# Patient Record
Sex: Female | Born: 1945
Health system: Southern US, Community
[De-identification: ages and names within clinical notes are randomized; demographics above are authoritative.]

## PROBLEM LIST (undated history)

## (undated) DIAGNOSIS — T7840XA Allergy, unspecified, initial encounter: Secondary | ICD-10-CM

## (undated) DIAGNOSIS — H269 Unspecified cataract: Secondary | ICD-10-CM

## (undated) DIAGNOSIS — C679 Malignant neoplasm of bladder, unspecified: Secondary | ICD-10-CM

## (undated) DIAGNOSIS — G4730 Sleep apnea, unspecified: Secondary | ICD-10-CM

## (undated) DIAGNOSIS — E785 Hyperlipidemia, unspecified: Secondary | ICD-10-CM

## (undated) DIAGNOSIS — M1990 Unspecified osteoarthritis, unspecified site: Secondary | ICD-10-CM

## (undated) DIAGNOSIS — R519 Headache, unspecified: Secondary | ICD-10-CM

## (undated) DIAGNOSIS — Z87442 Personal history of urinary calculi: Secondary | ICD-10-CM

## (undated) DIAGNOSIS — R51 Headache: Secondary | ICD-10-CM

## (undated) DIAGNOSIS — K219 Gastro-esophageal reflux disease without esophagitis: Secondary | ICD-10-CM

## (undated) DIAGNOSIS — I10 Essential (primary) hypertension: Secondary | ICD-10-CM

## (undated) HISTORY — PX: PARTIAL HYSTERECTOMY: SHX80

## (undated) HISTORY — PX: EYE SURGERY: SHX253

## (undated) HISTORY — DX: Allergy, unspecified, initial encounter: T78.40XA

## (undated) HISTORY — PX: CORNEAL TRANSPLANT: SHX108

## (undated) HISTORY — PX: CHOLECYSTECTOMY: SHX55

## (undated) HISTORY — DX: Unspecified osteoarthritis, unspecified site: M19.90

## (undated) HISTORY — DX: Unspecified cataract: H26.9

## (undated) HISTORY — DX: Malignant neoplasm of bladder, unspecified: C67.9

## (undated) HISTORY — PX: APPENDECTOMY: SHX54

## (undated) HISTORY — DX: Gastro-esophageal reflux disease without esophagitis: K21.9

## (undated) HISTORY — DX: Essential (primary) hypertension: I10

## (undated) HISTORY — DX: Personal history of urinary calculi: Z87.442

## (undated) HISTORY — PX: FOOT SURGERY: SHX648

## (undated) HISTORY — DX: Sleep apnea, unspecified: G47.30

## (undated) HISTORY — DX: Hyperlipidemia, unspecified: E78.5

## (undated) HISTORY — PX: TONSILLECTOMY: SUR1361

---

## 1998-10-30 ENCOUNTER — Other Ambulatory Visit: Admission: RE | Admit: 1998-10-30 | Discharge: 1998-10-30 | Payer: Self-pay | Admitting: Obstetrics & Gynecology

## 1999-10-28 ENCOUNTER — Other Ambulatory Visit: Admission: RE | Admit: 1999-10-28 | Discharge: 1999-10-28 | Payer: Self-pay | Admitting: Obstetrics & Gynecology

## 2001-01-18 ENCOUNTER — Other Ambulatory Visit: Admission: RE | Admit: 2001-01-18 | Discharge: 2001-01-18 | Payer: Self-pay | Admitting: Obstetrics & Gynecology

## 2002-01-18 ENCOUNTER — Other Ambulatory Visit: Admission: RE | Admit: 2002-01-18 | Discharge: 2002-01-18 | Payer: Self-pay | Admitting: Obstetrics & Gynecology

## 2002-01-31 ENCOUNTER — Encounter: Admission: RE | Admit: 2002-01-31 | Discharge: 2002-01-31 | Payer: Self-pay | Admitting: Obstetrics & Gynecology

## 2002-01-31 ENCOUNTER — Encounter: Payer: Self-pay | Admitting: Obstetrics & Gynecology

## 2003-02-14 ENCOUNTER — Other Ambulatory Visit: Admission: RE | Admit: 2003-02-14 | Discharge: 2003-02-14 | Payer: Self-pay | Admitting: Obstetrics & Gynecology

## 2004-05-29 ENCOUNTER — Ambulatory Visit: Payer: Self-pay | Admitting: Gastroenterology

## 2004-06-09 ENCOUNTER — Ambulatory Visit (HOSPITAL_COMMUNITY): Admission: RE | Admit: 2004-06-09 | Discharge: 2004-06-09 | Payer: Self-pay | Admitting: Gastroenterology

## 2004-06-09 ENCOUNTER — Ambulatory Visit: Payer: Self-pay | Admitting: Gastroenterology

## 2004-07-13 ENCOUNTER — Other Ambulatory Visit: Admission: RE | Admit: 2004-07-13 | Discharge: 2004-07-13 | Payer: Self-pay | Admitting: Obstetrics & Gynecology

## 2009-06-06 ENCOUNTER — Encounter (INDEPENDENT_AMBULATORY_CARE_PROVIDER_SITE_OTHER): Payer: Self-pay | Admitting: *Deleted

## 2009-11-28 ENCOUNTER — Encounter (INDEPENDENT_AMBULATORY_CARE_PROVIDER_SITE_OTHER): Payer: Self-pay | Admitting: *Deleted

## 2009-12-12 ENCOUNTER — Encounter (INDEPENDENT_AMBULATORY_CARE_PROVIDER_SITE_OTHER): Payer: Self-pay | Admitting: *Deleted

## 2009-12-17 ENCOUNTER — Ambulatory Visit: Payer: Self-pay | Admitting: Gastroenterology

## 2010-01-05 ENCOUNTER — Ambulatory Visit: Payer: Self-pay | Admitting: Gastroenterology

## 2010-01-08 ENCOUNTER — Encounter: Payer: Self-pay | Admitting: Gastroenterology

## 2010-03-03 NOTE — Letter (Signed)
Summary: [facility] Instructions  [facility]  [address] [place], [place] [address]   Phone: [phone]  Fax: [phone]       Tara Ingram    11/26/1945    MRN: [id number]        Procedure Day /Date: Monday 01-05-10      Arrival Time: 1:30 p.m.     Procedure Time: 2:30 p.m.     Location of Procedure:                      [facility] (4th Floor)   PREPARATION FOR COLONOSCOPY WITH MOVIPREP   Starting 5 days prior to your procedure  12-31-09 do not eat nuts, seeds, popcorn, corn, beans, peas,  salads, or any raw vegetables.  Do not take any fiber supplements (e.g. Metamucil, Citrucel, and Benefiber).  THE DAY BEFORE YOUR PROCEDURE         DATE:  01-04-10   DAY:  Sunday  1.  Drink clear liquids the entire day-NO SOLID FOOD  2.  Do not drink anything colored red or purple.  Avoid juices with pulp.  No orange juice.  3.  Drink at least 64 oz. (8 glasses) of fluid/clear liquids during the day to prevent dehydration and help the prep work efficiently.  CLEAR LIQUIDS INCLUDE: Water Jello Ice Popsicles Tea (sugar ok, no milk/cream) Powdered fruit flavored drinks Coffee (sugar ok, no milk/cream) Gatorade Juice: apple, white grape, white cranberry  Lemonade Clear bullion, consomm, broth Carbonated beverages (any kind) Strained chicken noodle soup Hard Candy                             4.  In the morning, mix first dose of MoviPrep solution:    Empty 1 Pouch A and 1 Pouch B into the disposable container    Add lukewarm drinking water to the top line of the container. Mix to dissolve    Refrigerate (mixed solution should be used within 24 hrs)  5.  Begin drinking the prep at 5:00 p.m. The MoviPrep container is divided by 4 marks.   Every 15 minutes drink the solution down to the next mark (approximately 8 oz) until the full liter is complete.   6.  Follow completed prep with 16 oz of clear liquid of your choice (Nothing red or  purple).  Continue to drink clear liquids until bedtime.  7.  Before going to bed, mix second dose of MoviPrep solution:    Empty 1 Pouch A and 1 Pouch B into the disposable container    Add lukewarm drinking water to the top line of the container. Mix to dissolve    Refrigerate  THE DAY OF YOUR PROCEDURE      DATE:  01-05-10  DAY:   Monday  Beginning at  9:30 a.m. (5 hours before procedure):         1. Every 15 minutes, drink the solution down to the next mark (approx 8 oz) until the full liter is complete.  2. Follow completed prep with 16 oz. of clear liquid of your choice.    3. You may drink clear liquids until  12:30 p.m. (2 HOURS BEFORE PROCEDURE).   MEDICATION INSTRUCTIONS  Unless otherwise instructed, you should take regular prescription medications with a small sip of water   as early as possible the morning of your procedure.    Additional medication instructions:Do not take Diovan/HCTZ  day of procedure.         OTHER INSTRUCTIONS  You will need a responsible adult at least 67 years of age to accompany you and drive you home.   This person must remain in the waiting room during your procedure.  Wear loose fitting clothing that is easily removed.  Leave jewelry and other valuables at home.  However, you may wish to bring a book to read or  an iPod/MP3 player to listen to music as you wait for your procedure to start.  Remove all body piercing jewelry and leave at home.  Total time from sign-in until discharge is approximately 2-3 hours.  You should go home directly after your procedure and rest.  You can resume normal activities the  day after your procedure.  The day of your procedure you should not:   Drive   Make legal decisions   Operate machinery   Drink alcohol   Return to work  You will receive specific instructions about eating, activities and medications before you leave.    The above instructions have been reviewed and explained  to me by   Ezra Sites RN  December 17, 2009 2:10 PM    I fully understand and can verbalize these instructions _____________________________ Date _________

## 2010-03-03 NOTE — Miscellaneous (Signed)
Summary: [facility]  Clinical Lists Changes  Medications: Added new medication of MOVIPREP 100 GM  SOLR (PEG-KCL-NACL-NASULF-NA ASC-C) As per prep instructions. - Signed Rx of MOVIPREP 100 GM  SOLR (PEG-KCL-NACL-NASULF-NA ASC-C) As per prep instructions.;  #1 x 0;  Signed;  Entered by: Ezra Sites RN;  Authorized by: Rachael Fee MD;  Method used: Electronically to [facility]*, [address] [place], [place]  [id number], Ph: [phone], Fax: [phone] Observations: Added new observation of NKA: T (12/17/2009 13:45)    Prescriptions: MOVIPREP 100 GM  SOLR (PEG-KCL-NACL-NASULF-NA ASC-C) As per prep instructions.  #1 x 0   Entered by:   Ezra Sites RN   Authorized by:   Rachael Fee MD   Signed by:   Ezra Sites RN on 12/17/2009   Method used:   Electronically to        [facility]* (retail)       [address]       [place], [place]  [id number]       Ph: [phone]       Fax: [phone]   RxID:   [id number]

## 2010-03-03 NOTE — Letter (Signed)
Summary: Pre Visit Letter Revised  [facility]  [address] [place], [place] [address]   Phone: [phone]  Fax: [phone]        11/28/2009 MRN: [id number] [facility] [address] [place], [place]  [address]             Procedure Date:  01/23/2010   Welcome to the Gastroenterology Division at [facility].    You are scheduled to see a nurse for your pre-procedure visit on 01/19/2010 at 10:30AM on the 3rd floor at [facility], [address]  We ask that you try to arrive at our office 15 minutes prior to your appointment time to allow for check-in.  Please take a minute to review the attached form.  If you answer "Yes" to one or more of the questions on the first page, we ask that you call the person listed at your earliest opportunity.  If you answer "No" to all of the questions, please complete the rest of the form and bring it to your appointment.    Your nurse visit will consist of discussing your medical and surgical history, your immediate family medical history, and your medications.   If you are unable to list all of your medications on the form, please bring the medication bottles to your appointment and we will list them.  We will need to be aware of both prescribed and over the counter drugs.  We will need to know exact dosage information as well.    Please be prepared to read and sign documents such as consent forms, a financial agreement, and acknowledgement forms.  If necessary, and with your consent, a friend or relative is welcome to sit-in on the nurse visit with you.  Please bring your insurance card so that we may make a copy of it.  If your insurance requires a referral to see a specialist, please bring your referral form from your primary care physician.  No co-pay is required for this nurse visit.     If you cannot keep your appointment, please call [phone] to cancel or reschedule prior to your appointment date.   This allows [place] the opportunity to schedule an appointment for another patient in need of care.    Thank you for choosing [facility] for your medical needs.  We appreciate the opportunity to care for you.  Please visit [place] at our website  to learn more about our practice.  Sincerely, The Gastroenterology Division

## 2010-03-03 NOTE — Letter (Signed)
Summary: Results Letter  [facility]  [address] [place], [place] [address]   Phone: [phone]  Fax: [phone]        January 08, 2010 MRN: [id number]    [facility] [address] [place], [place]  [address]    Dear Ms. Pizzo,   The polyps removed during your recent procedure were proven to be adenomatous.  These are pre-cancerous polyps that may have grown into cancers if they had not been removed.  Based on current nationally recognized surveillance guidelines, I recommend that you have a repeat colonoscopy in 1 year.   We will therefore put your information in our reminder system and will contact you in 1 year to schedule a repeat procedure.  Please call if you have any questions or concerns.       Sincerely,  Rachael Fee MD  This letter has been electronically signed by your physician.  Appended Document: Results Letter Letter mailed

## 2010-03-03 NOTE — Letter (Signed)
Summary: Colonoscopy Letter  [facility]  [address] [place], [place] [address]   Phone: [phone]  Fax: [phone]      Jun 06, 2009 MRN: [id number]   [facility] [address] [place], [place]  [address]   Dear Ms. Verley,   According to your medical record, it is time for you to schedule a Colonoscopy. The American Cancer Society recommends this procedure as a method to detect early colon cancer. Patients with a family history of colon cancer, or a personal history of colon polyps or inflammatory bowel disease are at increased risk.  This letter has been generated based on the recommendations made at the time of your procedure. If you feel that in your particular situation this may no longer apply, please contact our office.  Please call our office at [phone] to schedule this appointment or to update your records at your earliest convenience.  Thank you for cooperating with [place] to provide you with the very best care possible.   Sincerely,  Rachael Fee, M.D.  [facility] Gastroenterology Division [phone]

## 2010-03-03 NOTE — Procedures (Signed)
Summary: Colonoscopy  Patient: [name] Note: All result statuses are Final unless otherwise noted.  Tests: (1) Colonoscopy (COL)   COL Colonoscopy           DONE     [facility]     [address]     [place], [place]  [address]           COLONOSCOPY PROCEDURE REPORT           PATIENT:  Tara Ingram, Tara Ingram  MR#:  [id number]     BIRTHDATE:  September 23, 1945, 64 yrs. old  GENDER:  female     ENDOSCOPIST:  Rachael Fee, MD     PROCEDURE DATE:  01/05/2010     PROCEDURE:  Colonoscopy with snare polypectomy     ASA CLASS:  Class II     INDICATIONS:  history of pre-cancerous (adenomatous) colon polyps,     and FH of colon cancer (sister)     MEDICATIONS:   Fentanyl 75 mcg IV, Versed 7 mg IV           DESCRIPTION OF PROCEDURE:   After the risks benefits and     alternatives of the procedure were thoroughly explained, informed     consent was obtained.  Digital rectal exam was performed and     revealed no rectal masses.   The LB PCF-H180AL [id number] endoscope     was introduced through the anus and advanced to the cecum, which     was identified by both the appendix and ileocecal valve, without     limitations.  The quality of the prep was good, using MoviPrep.     The instrument was then slowly withdrawn as the colon was fully     examined.     <<PROCEDUREIMAGES>>     FINDINGS:  Four sessile polyps were found, all were removed. One     was 1cm long, located in cecum, removed piecemeal with cold snare     (path 1). One was 4mm across, located in ascending colon, removed     with cold snare, not retrieved. One was 4mm across, located in     transverse colon, removed with cold snare (path 1). The last was     12mm across, located in rectum, removed in piecemeal fashion with     snare/cautery (path 2) (see image1, image3, image4, image9, and     image11).  This was otherwise a normal examination of the colon     (see image8, image5, and image2).   Retroflexed views in the  rectum revealed no abnormalities.    The scope was then withdrawn     from the patient and the procedure completed.           COMPLICATIONS:  None     ENDOSCOPIC IMPRESSION:     1) Four polyps, all removed. Three were retrieved and sent to     pathology     2) Otherwise normal examination           RECOMMENDATIONS:           1) You will receive a letter within 1-2 weeks with the results     of your biopsy as well as final recommendations. Please call my     office if you have not received a letter after 3 weeks.  Likely     you will need a repeat colonoscopy in 1 year given piecemeal     resection  ______________________________     Rachael Fee, MD           n.     eSIGNED:   Rachael Fee at 01/05/2010 02:00 PM           [name], [id number]  Note: An exclamation mark (!) indicates a result that was not dispersed into the flowsheet. Document Creation Date: 01/05/2010 2:01 PM _______________________________________________________________________  (1) Order result status: Final Collection or observation date-time: 01/05/2010 13:54 Requested date-time:  Receipt date-time:  Reported date-time:  Referring Physician:   Ordering Physician: Rob Bunting [phone]) Specimen Source:  Source: [name] Order Number: [id number] Lab site:   Appended Document: Colonoscopy     Procedures Next Due Date:    Colonoscopy: 01/2011

## 2010-04-17 ENCOUNTER — Other Ambulatory Visit: Payer: Medicare Other

## 2010-04-17 ENCOUNTER — Ambulatory Visit (INDEPENDENT_AMBULATORY_CARE_PROVIDER_SITE_OTHER): Payer: Medicare Other | Admitting: Gastroenterology

## 2010-04-17 ENCOUNTER — Other Ambulatory Visit: Payer: Self-pay | Admitting: Gastroenterology

## 2010-04-17 ENCOUNTER — Encounter: Payer: Self-pay | Admitting: Gastroenterology

## 2010-04-17 DIAGNOSIS — R197 Diarrhea, unspecified: Secondary | ICD-10-CM

## 2010-04-17 LAB — IGA: IgA: 307 mg/dL (ref 68–378)

## 2010-04-17 LAB — SEDIMENTATION RATE: Sed Rate: 16 mm/hr (ref 0–22)

## 2010-04-21 LAB — CONVERTED CEMR LAB: Tissue Transglutaminase Ab, IgA: 5.2 units (ref <20)

## 2010-04-21 NOTE — Assessment & Plan Note (Signed)
History of Present Illness Visit Type: new patient  Primary GI MD: Rob Bunting MD Primary Provider: Claudie Fisherman, MD  Requesting Provider: na Chief Complaint: diarrhea  History of Present Illness:        very pleasant 65 year old woman who is here with her husband today. I performed colonoscopy december 2011 (4 colon polyps, adenomatous, one piecemeal resected, recall at one year interval).  she has chronic, years of issues with her bowels.  Since her colonoscopy, she has had loose stools following eating.  This is improving, but not yet normal.  Eating causes urgency, gas, pains in her mid abdomen. Maybe bread products are worse. She was having same problem for "year," but thinks its worse since the colonoscopy.  had cholecystecomy 15 years ago.  She always has loose stools.  Overall weight is up a bit.  No overt gi bleeding.           Current Medications (verified): 1)  None  Allergies (verified): No Known Drug Allergies  Past History:  Past Medical History:  adenomatous colon polyps  Past Surgical History:  none  Family History: sister with colon cancer  Social History:  she is married, she does not drink much alcohol or smoke cigarettes  Review of Systems       Pertinent positive and negative review of systems were noted in the above HPI and GI specific review of systems.  All other review of systems was otherwise negative.   Vital Signs:  Patient profile:   65 year old female Height:      64 inches Weight:      182 pounds BMI:     31.35 BSA:     1.88 Pulse rate:   88 / minute Pulse rhythm:   regular BP sitting:   128 / 76  (left arm) Cuff size:   regular  Vitals Entered By: Ok Anis CMA (April 17, 2010 10:33 AM)  Physical Exam  Additional Exam:  Constitutional: generally well appearing Psychiatric: alert and oriented times 3 Eyes: extraocular movements intact Mouth: oropharynx moist, no lesions Neck: supple, no  lymphadenopathy Cardiovascular: heart regular rate and rythm Lungs: CTA bilaterally Abdomen: soft, non-tender, non-distended, no obvious ascites, no peritoneal signs, normal bowel sounds Extremities: no lower extremity edema bilaterally Skin: no lesions on visible extremities    Impression & Recommendations:  Problem # 1:   Chronic loose stools, postprandial urgency  this may be related to this regulated by O. from her cholecystectomy. we will begin a trial of cholestyramine powder and she will call my office in 4 weeks to report on her symptoms. she had a colonoscopy 3-4 months ago and that does not need to be repeated. she is set for recall colonoscopy in about one year. she does state when she avoids breads , bread products that her diarrhea is less and and so we will check her for celiac sprue serologically.  Other Orders: TLB-IgA (Immunoglobulin A) (82784-IGA) TLB-Sedimentation Rate (ESR) (85652-ESR) T-Sprue Panel (Celiac Disease Aby Eval) (83516x3/[address])  Patient Instructions: 1)  You will get lab test(s) done today (celiac sprue panel, total IgA, ESR). 2)  Trial of cholestyramine powder, called into your pharmacy already. 3)  Call to report on your symptoms in 1 month. 4)  A copy of this information will be sent to Dr. Juleen China. 5)  The medication list was reviewed and reconciled.  All changed / newly prescribed medications were explained.  A complete medication list was provided to the patient /  caregiver. Prescriptions: CHOLESTYRAMINE   POWD (CHOLESTYRAMINE) take 4grams powder, once daily  #1 month x 3   Entered and Authorized by:   Rachael Fee MD   Signed by:   Rachael Fee MD on 04/17/2010   Method used:   Electronically to        [facility]* (retail)       [address]       [place], [place]  [id number]       Ph: [phone]       Fax: [phone]   RxID:   [id number]

## 2010-04-23 ENCOUNTER — Telehealth: Payer: Self-pay

## 2010-04-23 NOTE — Telephone Encounter (Signed)
Called pt and informed her of the lab results and the recommendation to have an EGD.  Pt states she is not ready to schedule at this time she will call when she is ready.

## 2010-04-23 NOTE — Telephone Encounter (Signed)
Message copied by Chales Abrahams on Thu Apr 23, 2010 10:52 AM ------      Message from: Chales Abrahams      Created: Tue Apr 21, 2010  1:50 PM       please call her.  celiac panel slightly elevated, she needs EGD at [facility] with biopsies.  She should not avoid gluten in the meantime.

## 2010-04-23 NOTE — Telephone Encounter (Signed)
ok 

## 2010-09-01 ENCOUNTER — Other Ambulatory Visit: Payer: Self-pay

## 2010-09-01 MED ORDER — CHOLESTYRAMINE 4 GM/DOSE PO POWD: 4.0000 g | Freq: Every day | ORAL | Status: DC

## 2010-11-18 DIAGNOSIS — T8529XA Other mechanical complication of intraocular lens, initial encounter: Secondary | ICD-10-CM | POA: Insufficient documentation

## 2010-11-18 DIAGNOSIS — IMO0002 Reserved for concepts with insufficient information to code with codable children: Secondary | ICD-10-CM | POA: Insufficient documentation

## 2010-12-31 ENCOUNTER — Encounter: Payer: Self-pay | Admitting: Gastroenterology

## 2011-01-06 ENCOUNTER — Telehealth: Payer: Self-pay | Admitting: Gastroenterology

## 2011-01-06 NOTE — Telephone Encounter (Signed)
Pt wants to make sure that her insurance will cover her colon that Dr Christella Hartigan recommends to be done at the end of the year.  I advised her that after the procedure is scheduled Morrie Sheldon will contact the insurance company and notify her of any problems.  Pt thanked me for calling

## 2011-02-25 ENCOUNTER — Other Ambulatory Visit: Payer: Self-pay

## 2011-02-25 MED ORDER — CHOLESTYRAMINE 4 GM/DOSE PO POWD: 4.0000 g | Freq: Every day | ORAL | Status: DC

## 2011-03-10 DIAGNOSIS — M1710 Unilateral primary osteoarthritis, unspecified knee: Secondary | ICD-10-CM | POA: Diagnosis not present

## 2011-03-10 DIAGNOSIS — Z79899 Other long term (current) drug therapy: Secondary | ICD-10-CM | POA: Diagnosis not present

## 2011-04-01 DIAGNOSIS — E789 Disorder of lipoprotein metabolism, unspecified: Secondary | ICD-10-CM | POA: Diagnosis not present

## 2011-04-08 DIAGNOSIS — I10 Essential (primary) hypertension: Secondary | ICD-10-CM | POA: Diagnosis not present

## 2011-04-08 DIAGNOSIS — M79609 Pain in unspecified limb: Secondary | ICD-10-CM | POA: Diagnosis not present

## 2011-04-08 DIAGNOSIS — E789 Disorder of lipoprotein metabolism, unspecified: Secondary | ICD-10-CM | POA: Diagnosis not present

## 2011-09-07 DIAGNOSIS — M2550 Pain in unspecified joint: Secondary | ICD-10-CM | POA: Diagnosis not present

## 2011-10-25 ENCOUNTER — Encounter: Payer: Self-pay | Admitting: Gastroenterology

## 2011-10-27 DIAGNOSIS — I10 Essential (primary) hypertension: Secondary | ICD-10-CM | POA: Diagnosis not present

## 2011-10-27 DIAGNOSIS — E789 Disorder of lipoprotein metabolism, unspecified: Secondary | ICD-10-CM | POA: Diagnosis not present

## 2011-10-28 ENCOUNTER — Encounter: Payer: Self-pay | Admitting: Gastroenterology

## 2011-11-03 DIAGNOSIS — M79609 Pain in unspecified limb: Secondary | ICD-10-CM | POA: Diagnosis not present

## 2011-11-03 DIAGNOSIS — Z23 Encounter for immunization: Secondary | ICD-10-CM | POA: Diagnosis not present

## 2011-11-03 DIAGNOSIS — I10 Essential (primary) hypertension: Secondary | ICD-10-CM | POA: Diagnosis not present

## 2011-11-03 DIAGNOSIS — H6120 Impacted cerumen, unspecified ear: Secondary | ICD-10-CM | POA: Diagnosis not present

## 2011-11-10 DIAGNOSIS — H6120 Impacted cerumen, unspecified ear: Secondary | ICD-10-CM | POA: Diagnosis not present

## 2011-11-24 ENCOUNTER — Ambulatory Visit (AMBULATORY_SURGERY_CENTER): Payer: Medicare Other | Admitting: *Deleted

## 2011-11-24 VITALS — Ht 64.0 in | Wt 189.0 lb

## 2011-11-24 DIAGNOSIS — Z1211 Encounter for screening for malignant neoplasm of colon: Secondary | ICD-10-CM

## 2011-11-24 DIAGNOSIS — Z800 Family history of malignant neoplasm of digestive organs: Secondary | ICD-10-CM

## 2011-11-24 DIAGNOSIS — Z86010 Personal history of colonic polyps: Secondary | ICD-10-CM

## 2011-11-24 MED ORDER — MOVIPREP 100 G PO SOLR: ORAL | Status: DC

## 2011-11-24 NOTE — Progress Notes (Signed)
Explained to patient to hold Cholestyramine packet 2 days prior to starting the moviprep, per Dr.Gessners' suggestion.  She understands.

## 2011-12-03 HISTORY — PX: COLONOSCOPY: SHX174

## 2011-12-08 ENCOUNTER — Encounter: Payer: Self-pay | Admitting: Gastroenterology

## 2011-12-08 ENCOUNTER — Ambulatory Visit (AMBULATORY_SURGERY_CENTER): Payer: Medicare Other | Admitting: Gastroenterology

## 2011-12-08 VITALS — BP 123/54 | HR 76 | Temp 98.3°F | Resp 18 | Ht 64.0 in | Wt 189.0 lb

## 2011-12-08 DIAGNOSIS — Z860100 Personal history of colon polyps, unspecified: Secondary | ICD-10-CM

## 2011-12-08 DIAGNOSIS — K5730 Diverticulosis of large intestine without perforation or abscess without bleeding: Secondary | ICD-10-CM

## 2011-12-08 DIAGNOSIS — D126 Benign neoplasm of colon, unspecified: Secondary | ICD-10-CM

## 2011-12-08 DIAGNOSIS — D129 Benign neoplasm of anus and anal canal: Secondary | ICD-10-CM

## 2011-12-08 DIAGNOSIS — Z800 Family history of malignant neoplasm of digestive organs: Secondary | ICD-10-CM

## 2011-12-08 DIAGNOSIS — D128 Benign neoplasm of rectum: Secondary | ICD-10-CM | POA: Diagnosis not present

## 2011-12-08 DIAGNOSIS — Z1211 Encounter for screening for malignant neoplasm of colon: Secondary | ICD-10-CM

## 2011-12-08 MED ORDER — SODIUM CHLORIDE 0.9 % IV SOLN: 500.0000 mL | INTRAVENOUS | Status: DC

## 2011-12-08 NOTE — Progress Notes (Signed)
The pt tolerated the colonoscopy very well. Maw   

## 2011-12-08 NOTE — Progress Notes (Signed)
Patient did not experience any of the following events: a burn prior to discharge; a fall within the facility; wrong site/side/patient/procedure/implant event; or a hospital transfer or hospital admission upon discharge from the facility. (G8907) Patient did not have preoperative order for IV antibiotic SSI prophylaxis. (G8918)  

## 2011-12-08 NOTE — Patient Instructions (Addendum)

## 2011-12-08 NOTE — Op Note (Signed)
[facility] [address] [place], [address]   COLONOSCOPY PROCEDURE REPORT  PATIENT: Tara Ingram, An  MR#: [id number] BIRTHDATE: 02/10/1945 , 66  yrs. old GENDER: Female ENDOSCOPIST: Rachael Fee, MD PROCEDURE DATE:  12/08/2011 PROCEDURE:   Colonoscopy with snare polypectomy ASA CLASS:   Class III INDICATIONS:adenomatous polyps 2011, one piecemeal resected. MEDICATIONS: Fentanyl 75 mcg IV, Versed 6 mg IV, and These medications were titrated to patient response per physician's verbal order  DESCRIPTION OF PROCEDURE:   After the risks benefits and alternatives of the procedure were thoroughly explained, informed consent was obtained.  A digital rectal exam revealed no abnormalities of the rectum.   The LB PCF-Q180AL [id number]  endoscope was introduced through the anus and advanced to the cecum, which was identified by both the appendix and ileocecal valve. No adverse events experienced.   The quality of the prep was good, using MoviPrep  The instrument was then slowly withdrawn as the colon was fully examined.  COLON FINDINGS: Three polyps were found, removed and all were sent to pathology.  These were located in transverse, descending and rectal segments; 3-6mm across, sessile, all removed with cold snare and all sent to pathology.  There was mild, left sided diverticulosis.  The examination was otherwise normal.  Retroflexed views revealed no abnormalities. The time to cecum=2 minutes 37 seconds.  Withdrawal time=8 minutes 19 seconds.  The scope was withdrawn and the procedure completed. COMPLICATIONS: There were no complications.  ENDOSCOPIC IMPRESSION: Three polyps were found, removed and all were sent to pathology. There was mild, left sided diverticulosis. The examination was otherwise normal.  RECOMMENDATIONS: If the polyp(s) removed today are proven to be adenomatous (pre-cancerous) polyps, you will need a colonoscopy in 3 years. You will  receive a letter within 1-2 weeks with the results of your biopsy as well as final recommendations.  Please call my office if you have not received a letter after 3 weeks.   eSigned:  Rachael Fee, MD 12/08/2011 10:27 AM   cc: Adela Lank, MD

## 2011-12-09 ENCOUNTER — Other Ambulatory Visit: Payer: Self-pay | Admitting: Gastroenterology

## 2011-12-09 ENCOUNTER — Telehealth: Payer: Self-pay

## 2011-12-09 NOTE — Telephone Encounter (Signed)
  Follow up Call-  Call back number 12/08/2011  Post procedure Call Back phone  # [phone]  Permission to leave phone message Yes     Patient questions:  Do you have a fever, pain , or abdominal swelling? no Pain Score  0 *  Have you tolerated food without any problems? yes  Have you been able to return to your normal activities? yes  Do you have any questions about your discharge instructions: Diet   no Medications  no Follow up visit  no  Do you have questions or concerns about your Care? no  Actions: * If pain score is 4 or above: No action needed, pain <4.  Per the pt, "no problems at all". Maw

## 2011-12-19 ENCOUNTER — Encounter: Payer: Self-pay | Admitting: Gastroenterology

## 2012-02-16 DIAGNOSIS — T8529XA Other mechanical complication of intraocular lens, initial encounter: Secondary | ICD-10-CM | POA: Diagnosis not present

## 2012-02-16 DIAGNOSIS — H2510 Age-related nuclear cataract, unspecified eye: Secondary | ICD-10-CM | POA: Diagnosis not present

## 2012-03-22 DIAGNOSIS — M159 Polyosteoarthritis, unspecified: Secondary | ICD-10-CM | POA: Diagnosis not present

## 2012-07-20 DIAGNOSIS — E789 Disorder of lipoprotein metabolism, unspecified: Secondary | ICD-10-CM | POA: Diagnosis not present

## 2012-07-20 DIAGNOSIS — M79609 Pain in unspecified limb: Secondary | ICD-10-CM | POA: Diagnosis not present

## 2012-08-09 DIAGNOSIS — H2510 Age-related nuclear cataract, unspecified eye: Secondary | ICD-10-CM | POA: Diagnosis not present

## 2012-08-09 DIAGNOSIS — Z5189 Encounter for other specified aftercare: Secondary | ICD-10-CM | POA: Diagnosis not present

## 2012-08-10 DIAGNOSIS — M47817 Spondylosis without myelopathy or radiculopathy, lumbosacral region: Secondary | ICD-10-CM | POA: Diagnosis not present

## 2012-08-10 DIAGNOSIS — M549 Dorsalgia, unspecified: Secondary | ICD-10-CM | POA: Diagnosis not present

## 2012-08-10 DIAGNOSIS — E789 Disorder of lipoprotein metabolism, unspecified: Secondary | ICD-10-CM | POA: Diagnosis not present

## 2012-08-10 DIAGNOSIS — M159 Polyosteoarthritis, unspecified: Secondary | ICD-10-CM | POA: Diagnosis not present

## 2012-08-14 DIAGNOSIS — M545 Low back pain: Secondary | ICD-10-CM | POA: Diagnosis not present

## 2012-08-16 DIAGNOSIS — L820 Inflamed seborrheic keratosis: Secondary | ICD-10-CM | POA: Diagnosis not present

## 2012-08-16 DIAGNOSIS — L819 Disorder of pigmentation, unspecified: Secondary | ICD-10-CM | POA: Diagnosis not present

## 2012-08-16 DIAGNOSIS — M545 Low back pain: Secondary | ICD-10-CM | POA: Diagnosis not present

## 2012-08-30 ENCOUNTER — Other Ambulatory Visit: Payer: Self-pay | Admitting: Gastroenterology

## 2012-08-30 DIAGNOSIS — M545 Low back pain: Secondary | ICD-10-CM | POA: Diagnosis not present

## 2012-08-31 DIAGNOSIS — M545 Low back pain: Secondary | ICD-10-CM | POA: Diagnosis not present

## 2012-09-01 DIAGNOSIS — M545 Low back pain: Secondary | ICD-10-CM | POA: Diagnosis not present

## 2012-09-05 DIAGNOSIS — M545 Low back pain: Secondary | ICD-10-CM | POA: Diagnosis not present

## 2012-09-06 DIAGNOSIS — M545 Low back pain: Secondary | ICD-10-CM | POA: Diagnosis not present

## 2012-09-08 DIAGNOSIS — M545 Low back pain: Secondary | ICD-10-CM | POA: Diagnosis not present

## 2012-09-11 DIAGNOSIS — M545 Low back pain: Secondary | ICD-10-CM | POA: Diagnosis not present

## 2012-09-13 DIAGNOSIS — M545 Low back pain: Secondary | ICD-10-CM | POA: Diagnosis not present

## 2012-09-14 DIAGNOSIS — M545 Low back pain: Secondary | ICD-10-CM | POA: Diagnosis not present

## 2012-09-27 DIAGNOSIS — M545 Low back pain: Secondary | ICD-10-CM | POA: Diagnosis not present

## 2013-01-02 DIAGNOSIS — E789 Disorder of lipoprotein metabolism, unspecified: Secondary | ICD-10-CM | POA: Diagnosis not present

## 2013-01-18 DIAGNOSIS — I10 Essential (primary) hypertension: Secondary | ICD-10-CM | POA: Diagnosis not present

## 2013-01-18 DIAGNOSIS — E789 Disorder of lipoprotein metabolism, unspecified: Secondary | ICD-10-CM | POA: Diagnosis not present

## 2013-01-18 DIAGNOSIS — M79609 Pain in unspecified limb: Secondary | ICD-10-CM | POA: Diagnosis not present

## 2013-03-05 DIAGNOSIS — I10 Essential (primary) hypertension: Secondary | ICD-10-CM | POA: Diagnosis not present

## 2013-03-05 DIAGNOSIS — E785 Hyperlipidemia, unspecified: Secondary | ICD-10-CM | POA: Diagnosis not present

## 2013-03-05 DIAGNOSIS — J019 Acute sinusitis, unspecified: Secondary | ICD-10-CM | POA: Diagnosis not present

## 2013-03-15 DIAGNOSIS — R05 Cough: Secondary | ICD-10-CM | POA: Diagnosis not present

## 2013-03-15 DIAGNOSIS — I10 Essential (primary) hypertension: Secondary | ICD-10-CM | POA: Diagnosis not present

## 2013-03-15 DIAGNOSIS — Z79899 Other long term (current) drug therapy: Secondary | ICD-10-CM | POA: Diagnosis not present

## 2013-03-15 DIAGNOSIS — H6690 Otitis media, unspecified, unspecified ear: Secondary | ICD-10-CM | POA: Diagnosis not present

## 2013-03-15 DIAGNOSIS — J209 Acute bronchitis, unspecified: Secondary | ICD-10-CM | POA: Diagnosis not present

## 2013-03-15 DIAGNOSIS — R059 Cough, unspecified: Secondary | ICD-10-CM | POA: Diagnosis not present

## 2013-04-18 DIAGNOSIS — M79609 Pain in unspecified limb: Secondary | ICD-10-CM | POA: Diagnosis not present

## 2013-04-18 DIAGNOSIS — M064 Inflammatory polyarthropathy: Secondary | ICD-10-CM | POA: Diagnosis not present

## 2013-04-18 DIAGNOSIS — M19049 Primary osteoarthritis, unspecified hand: Secondary | ICD-10-CM | POA: Diagnosis not present

## 2013-05-22 ENCOUNTER — Other Ambulatory Visit: Payer: Self-pay | Admitting: Gastroenterology

## 2013-05-22 DIAGNOSIS — E789 Disorder of lipoprotein metabolism, unspecified: Secondary | ICD-10-CM | POA: Diagnosis not present

## 2013-05-28 DIAGNOSIS — G894 Chronic pain syndrome: Secondary | ICD-10-CM | POA: Diagnosis not present

## 2013-05-28 DIAGNOSIS — M79609 Pain in unspecified limb: Secondary | ICD-10-CM | POA: Diagnosis not present

## 2013-07-03 DIAGNOSIS — M25559 Pain in unspecified hip: Secondary | ICD-10-CM | POA: Diagnosis not present

## 2013-08-07 DIAGNOSIS — M064 Inflammatory polyarthropathy: Secondary | ICD-10-CM | POA: Diagnosis not present

## 2013-08-07 DIAGNOSIS — M19049 Primary osteoarthritis, unspecified hand: Secondary | ICD-10-CM | POA: Diagnosis not present

## 2013-08-10 DIAGNOSIS — Z5189 Encounter for other specified aftercare: Secondary | ICD-10-CM | POA: Diagnosis not present

## 2013-08-10 DIAGNOSIS — H2510 Age-related nuclear cataract, unspecified eye: Secondary | ICD-10-CM | POA: Diagnosis not present

## 2013-08-10 DIAGNOSIS — T8529XA Other mechanical complication of intraocular lens, initial encounter: Secondary | ICD-10-CM | POA: Diagnosis not present

## 2013-09-20 DIAGNOSIS — E789 Disorder of lipoprotein metabolism, unspecified: Secondary | ICD-10-CM | POA: Diagnosis not present

## 2013-09-27 DIAGNOSIS — M159 Polyosteoarthritis, unspecified: Secondary | ICD-10-CM | POA: Diagnosis not present

## 2013-09-27 DIAGNOSIS — E789 Disorder of lipoprotein metabolism, unspecified: Secondary | ICD-10-CM | POA: Diagnosis not present

## 2013-09-27 DIAGNOSIS — I10 Essential (primary) hypertension: Secondary | ICD-10-CM | POA: Diagnosis not present

## 2013-09-28 DIAGNOSIS — H2510 Age-related nuclear cataract, unspecified eye: Secondary | ICD-10-CM | POA: Diagnosis not present

## 2013-09-28 DIAGNOSIS — Z5189 Encounter for other specified aftercare: Secondary | ICD-10-CM | POA: Diagnosis not present

## 2013-09-28 DIAGNOSIS — T8529XA Other mechanical complication of intraocular lens, initial encounter: Secondary | ICD-10-CM | POA: Diagnosis not present

## 2013-11-08 ENCOUNTER — Encounter: Payer: Self-pay | Admitting: Gastroenterology

## 2013-11-26 DIAGNOSIS — M19041 Primary osteoarthritis, right hand: Secondary | ICD-10-CM | POA: Diagnosis not present

## 2013-11-26 DIAGNOSIS — M1990 Unspecified osteoarthritis, unspecified site: Secondary | ICD-10-CM | POA: Diagnosis not present

## 2013-11-26 DIAGNOSIS — Z23 Encounter for immunization: Secondary | ICD-10-CM | POA: Diagnosis not present

## 2013-11-26 DIAGNOSIS — M19042 Primary osteoarthritis, left hand: Secondary | ICD-10-CM | POA: Diagnosis not present

## 2014-01-02 DIAGNOSIS — E789 Disorder of lipoprotein metabolism, unspecified: Secondary | ICD-10-CM | POA: Diagnosis not present

## 2014-01-08 DIAGNOSIS — I10 Essential (primary) hypertension: Secondary | ICD-10-CM | POA: Diagnosis not present

## 2014-03-05 DIAGNOSIS — M19041 Primary osteoarthritis, right hand: Secondary | ICD-10-CM | POA: Diagnosis not present

## 2014-03-05 DIAGNOSIS — I10 Essential (primary) hypertension: Secondary | ICD-10-CM | POA: Diagnosis not present

## 2014-03-05 DIAGNOSIS — M1990 Unspecified osteoarthritis, unspecified site: Secondary | ICD-10-CM | POA: Diagnosis not present

## 2014-03-05 DIAGNOSIS — M79641 Pain in right hand: Secondary | ICD-10-CM | POA: Diagnosis not present

## 2014-03-05 DIAGNOSIS — M19042 Primary osteoarthritis, left hand: Secondary | ICD-10-CM | POA: Diagnosis not present

## 2014-03-05 DIAGNOSIS — M79642 Pain in left hand: Secondary | ICD-10-CM | POA: Diagnosis not present

## 2014-04-10 ENCOUNTER — Other Ambulatory Visit: Payer: Self-pay | Admitting: Gastroenterology

## 2014-04-11 ENCOUNTER — Encounter: Payer: Self-pay | Admitting: Gastroenterology

## 2014-04-11 ENCOUNTER — Telehealth: Payer: Self-pay | Admitting: Gastroenterology

## 2014-04-11 MED ORDER — CHOLESTYRAMINE 4 G PO PACK: PACK | ORAL | Status: DC

## 2014-04-11 NOTE — Telephone Encounter (Signed)
Pt advised refill sent to last until appt no refills until she follows up with Dr Ardis Hughs

## 2014-05-03 HISTORY — PX: BACK SURGERY: SHX140

## 2014-05-23 ENCOUNTER — Encounter (HOSPITAL_BASED_OUTPATIENT_CLINIC_OR_DEPARTMENT_OTHER): Payer: Self-pay | Admitting: *Deleted

## 2014-05-23 ENCOUNTER — Emergency Department (HOSPITAL_BASED_OUTPATIENT_CLINIC_OR_DEPARTMENT_OTHER)
Admission: EM | Admit: 2014-05-23 | Discharge: 2014-05-23 | Disposition: A | Discharge status: Home / Self Care | Payer: Medicare Other | Attending: Emergency Medicine | Admitting: Emergency Medicine

## 2014-05-23 ENCOUNTER — Emergency Department (HOSPITAL_BASED_OUTPATIENT_CLINIC_OR_DEPARTMENT_OTHER): Payer: Medicare Other

## 2014-05-23 DIAGNOSIS — Z79899 Other long term (current) drug therapy: Secondary | ICD-10-CM | POA: Diagnosis not present

## 2014-05-23 DIAGNOSIS — I10 Essential (primary) hypertension: Secondary | ICD-10-CM | POA: Insufficient documentation

## 2014-05-23 DIAGNOSIS — E785 Hyperlipidemia, unspecified: Secondary | ICD-10-CM | POA: Insufficient documentation

## 2014-05-23 DIAGNOSIS — K219 Gastro-esophageal reflux disease without esophagitis: Secondary | ICD-10-CM | POA: Insufficient documentation

## 2014-05-23 DIAGNOSIS — Y998 Other external cause status: Secondary | ICD-10-CM | POA: Insufficient documentation

## 2014-05-23 DIAGNOSIS — M545 Low back pain: Secondary | ICD-10-CM | POA: Diagnosis not present

## 2014-05-23 DIAGNOSIS — M1990 Unspecified osteoarthritis, unspecified site: Secondary | ICD-10-CM | POA: Diagnosis not present

## 2014-05-23 DIAGNOSIS — W010XXA Fall on same level from slipping, tripping and stumbling without subsequent striking against object, initial encounter: Secondary | ICD-10-CM | POA: Insufficient documentation

## 2014-05-23 DIAGNOSIS — S3992XA Unspecified injury of lower back, initial encounter: Secondary | ICD-10-CM | POA: Diagnosis present

## 2014-05-23 DIAGNOSIS — Y9301 Activity, walking, marching and hiking: Secondary | ICD-10-CM | POA: Insufficient documentation

## 2014-05-23 DIAGNOSIS — Y9289 Other specified places as the place of occurrence of the external cause: Secondary | ICD-10-CM | POA: Insufficient documentation

## 2014-05-23 DIAGNOSIS — S32000A Wedge compression fracture of unspecified lumbar vertebra, initial encounter for closed fracture: Secondary | ICD-10-CM | POA: Diagnosis not present

## 2014-05-23 DIAGNOSIS — S32020A Wedge compression fracture of second lumbar vertebra, initial encounter for closed fracture: Secondary | ICD-10-CM | POA: Diagnosis not present

## 2014-05-23 DIAGNOSIS — W19XXXA Unspecified fall, initial encounter: Secondary | ICD-10-CM

## 2014-05-23 MED ORDER — OXYCODONE-ACETAMINOPHEN 5-325 MG PO TABS: 1.0000 | ORAL_TABLET | Freq: Four times a day (QID) | ORAL | Status: DC | PRN

## 2014-05-23 MED ORDER — DOCUSATE SODIUM 100 MG PO CAPS: 100.0000 mg | ORAL_CAPSULE | Freq: Two times a day (BID) | ORAL | Status: DC

## 2014-05-23 MED ORDER — CYCLOBENZAPRINE HCL 10 MG PO TABS
10.0000 mg | ORAL_TABLET | Freq: Once | ORAL | Status: AC
  Administered 2014-05-23: 10 mg via ORAL
  Filled 2014-05-23: qty 1

## 2014-05-23 MED ORDER — OXYCODONE-ACETAMINOPHEN 5-325 MG PO TABS
2.0000 | ORAL_TABLET | Freq: Once | ORAL | Status: AC
  Administered 2014-05-23: 2 via ORAL
  Filled 2014-05-23: qty 2

## 2014-05-23 NOTE — Discharge Instructions (Signed)
Back, Compression Fracture °A compression fracture happens when a force is put upon the length of your spine. Slipping and falling on your bottom are examples of such a force. When this happens, sometimes the force is great enough to compress the building blocks (vertebral bodies) of your spine. Although this causes a lot of pain, this can usually be treated at home, unless your caregiver feels hospitalization is needed for pain control. °Your backbone (spinal column) is made up of 24 main vertebral bodies in addition to the sacrum and coccyx (see illustration). These are held together by tough fibrous tissues (ligaments) and by support of your muscles. Nerve roots pass through the openings between the vertebrae. A sudden wrenching move, injury, or a fall may cause a compression fracture of one of the vertebral bodies. This may result in back pain or spread of pain into the belly (abdomen), the buttocks, and down the leg into the foot. Pain may also be created by muscle spasm alone. °Large studies have been undertaken to determine the best possible course of action to help your back following injury and also to prevent future problems. The recommendations are as follows. °FOLLOWING A COMPRESSION FRACTURE: °Do the following only if advised by your caregiver.  °· If a back brace has been suggested or provided, wear it as directed. °· Do not stop wearing the back brace unless instructed by your caregiver. °· When allowed to return to regular activities, avoid a sedentary lifestyle. Actively exercise. Sporadic weekend binges of tennis, racquetball, or waterskiing may actually aggravate or create problems, especially if you are not in condition for that activity. °· Avoid sports requiring sudden body movements until you are in condition for them. Swimming and walking are safer activities. °· Maintain good posture. °· Avoid obesity. °· If not already done, you should have a DEXA scan. Based on the results, be treated for  osteoporosis. °FOLLOWING ACUTE (SUDDEN) INJURY: °· Only take over-the-counter or prescription medicines for pain, discomfort, or fever as directed by your caregiver. °· Use bed rest for only the most extreme acute episode. Prolonged bed rest may aggravate your condition. Ice used for acute conditions is effective. Use a large plastic bag filled with ice. Wrap it in a towel. This also provides excellent pain relief. This may be continuous. Or use it for 30 minutes every 2 hours during acute phase, then as needed. Heat for 30 minutes prior to activities is helpful. °· As soon as the acute phase (the time when your back is too painful for you to do normal activities) is over, it is important to resume normal activities and work hardening programs. Back injuries can cause potentially marked changes in lifestyle. So it is important to attack these problems aggressively. °· See your caregiver for continued problems. He or she can help or refer you for appropriate exercises, physical therapy, and work hardening if needed. °· If you are given narcotic medications for your condition, for the next 24 hours do not: °¨ Drive. °¨ Operate machinery or power tools. °¨ Sign legal documents. °· Do not drink alcohol, or take sleeping pills or other medications that may interfere with treatment. °If your caregiver has given you a follow-up appointment, it is very important to keep that appointment. Not keeping the appointment could result in a chronic or permanent injury, pain, and disability. If there is any problem keeping the appointment, you must call back to this facility for assistance.  °SEEK IMMEDIATE MEDICAL CARE IF: °· You develop numbness,   tingling, weakness, or problems with the use of your arms or legs. °· You develop severe back pain not relieved with medications. °· You have changes in bowel or bladder control. °· You have increasing pain in any areas of the body. °Document Released: 01/18/2005 Document Revised:  06/04/2013 Document Reviewed: 08/23/2007 °[employer]® Patient Information ©2015 [employer]. This information is not intended to replace advice given to you by your health care provider. Make sure you discuss any questions you have with your health care provider. ° °

## 2014-05-23 NOTE — ED Notes (Signed)
Patient preparing for discharge. 

## 2014-05-23 NOTE — ED Notes (Signed)
MD at bedside. 

## 2014-05-23 NOTE — ED Provider Notes (Signed)
CSN: [id number]     Arrival date & time 05/23/14  1539 History   First MD Initiated Contact with Patient 05/23/14 1608     Chief Complaint  Patient presents with  . Fall  . Back Injury     (Consider location/radiation/quality/duration/timing/severity/associated sxs/prior Treatment) HPI Comments: 69 year old female fell 12 days ago walking onto her deck. No preceding symptoms, just tripped and fell. There are 2 steps from her house down to her day. Landed on her buttocks. He has had lower back pain since the incident. Denies any urinary incontinence, urinary retention, bowel incontinence, bowel retention, saddle anesthesia, numbness or tingling in legs. She's had persistent back pain without relief when using Vicodin. She did have some mild hematuria 4-5 days after the incident but has had none since.  Patient is a 69 y.o. female presenting with fall. The history is provided by the patient.  Fall This is a new problem. The current episode started more than 1 week ago (12 days ago). Episode frequency: once. The problem has not changed since onset.Pertinent negatives include no chest pain, no abdominal pain, no headaches and no shortness of breath. Nothing aggravates the symptoms. Nothing relieves the symptoms.    Past Medical History  Diagnosis Date  . Arthritis   . Hypertension   . Hyperlipidemia   . GERD (gastroesophageal reflux disease)    Past Surgical History  Procedure Laterality Date  . Tonsillectomy    . Partial hysterectomy    . Cholecystectomy    . Corneal transplant      bil   Family History  Problem Relation Age of Onset  . Colon cancer Sister 91   History  Substance Use Topics  . Smoking status: Never Smoker   . Smokeless tobacco: Never Used  . Alcohol Use: No   OB History    No data available     Review of Systems  Constitutional: Negative for fever and chills.  Respiratory: Negative for shortness of breath.   Cardiovascular: Negative for chest pain.   Gastrointestinal: Negative for vomiting and abdominal pain.  Genitourinary: Positive for hematuria. Negative for dysuria. Genital sores: first 4-5 days afterwards.  Neurological: Negative for headaches.  All other systems reviewed and are negative.     Allergies  Review of patient's allergies indicates no known allergies.  Home Medications   Prior to Admission medications   Medication Sig Start Date End Date Taking? Authorizing Provider  celecoxib (CELEBREX) 200 MG capsule Take 200 mg by mouth daily.    Historical Provider, MD  cholestyramine (QUESTRAN) 4 G packet MIX 1 PACKET (4 G TOTAL) AND TAKE BY MOUTH ONCE A DAY 04/11/14   Milus Banister, MD  diphenhydrAMINE (BENADRYL) 25 MG tablet Take 25 mg by mouth as needed.    Historical Provider, MD  HYDROcodone-acetaminophen (VICODIN) 5-500 MG per tablet Take 1 tablet by mouth every 8 (eight) hours as needed.    Historical Provider, MD  Omeprazole-Sodium Bicarbonate (ZEGERID OTC PO) Take 1 tablet by mouth as needed.    Historical Provider, MD  pravastatin (PRAVACHOL) 80 MG tablet Take 40 mg by mouth daily.    Historical Provider, MD  predniSONE (DELTASONE) 5 MG tablet Take 2.5 mg by mouth every other day.    Historical Provider, MD  valsartan-hydrochlorothiazide (DIOVAN-HCT) 320-12.5 MG per tablet Take 0.5 tablets by mouth daily.    Historical Provider, MD   BP 123/80 mmHg  Pulse 100  Temp(Src) 98.6 F (37 C) (Oral)  Resp 18  Ht 5'  4" (1.626 m)  Wt 185 lb (83.915 kg)  BMI 31.74 kg/m2  SpO2 99% Physical Exam  Constitutional: She is oriented to person, place, and time. She appears well-developed and well-nourished. No distress.  HENT:  Head: Normocephalic and atraumatic.  Mouth/Throat: Oropharynx is clear and moist.  Eyes: EOM are normal. Pupils are equal, round, and reactive to light.  Neck: Normal range of motion. Neck supple.  Cardiovascular: Normal rate and regular rhythm.  Exam reveals no friction rub.   No murmur  heard. Pulmonary/Chest: Effort normal and breath sounds normal. No respiratory distress. She has no wheezes. She has no rales.  Abdominal: Soft. She exhibits no distension. There is no tenderness. There is no rebound.  Musculoskeletal: Normal range of motion. She exhibits no edema.       Lumbar back: She exhibits tenderness (across entire lower back) and bony tenderness (across entire lower back). She exhibits normal range of motion, no swelling, no edema, no deformity and no laceration.  Neurological: She is alert and oriented to person, place, and time.  Skin: No rash noted. She is not diaphoretic.  Nursing note and vitals reviewed.   ED Course  Procedures (including critical care time) Labs Review Labs Reviewed - No data to display  Imaging Review Dg Lumbar Spine Complete  05/23/2014   CLINICAL DATA:  Fall 5 days ago off deck landing on lumbar spine. Initial encounter.  EXAM: LUMBAR SPINE - COMPLETE 4+ VIEW  COMPARISON:  06/09/2004 abdominal radiograph.  FINDINGS: There are 5 non rib-bearing lumbar type vertebral bodies. Mild lumbar dextroscoliosis is again seen. There is a new L2 anterior wedge compression fracture with approximately 50% vertebral body height loss. There is grade 1 anterolisthesis of L4 on L5. Mild-to-moderate disc space narrowing is present at L4-5. Prominent facet arthrosis is noted at L4-5 and L5-S1. No definite pars defects are identified. The bones are osteopenic. Right upper quadrant abdominal surgical clips are noted.  IMPRESSION: 1. New, mild to moderate L2 compression fracture. 2. Lower lumbar facet arthrosis with associated grade 1 anterolisthesis of L4 on L5.   Electronically Signed   By: Logan Bores   On: 05/23/2014 18:01     EKG Interpretation None      MDM   Final diagnoses:  Fall  Compression fracture of L2, closed, initial encounter    20F here with lower back pain after a fall 12 days ago. Had some mild hematuria afterwards, ceased about one week  ago. Persistent lower back pain despite Vicodin. Denies any red flag symptoms of cord compression. Vitals are stable here. She has some mild tenderness across her entire lower back. With set up for compression fracture, will x-ray her lumbar spine. Percocet given. Xray shows L2 compression fracture. Given Neurosurgery f/u. Stable for discharge.  I have reviewed all labs and imaging and considered them in my medical decision making.    Evelina Bucy, MD 05/24/14 [phone]

## 2014-05-23 NOTE — ED Notes (Addendum)
Fell on her deck 2 weeks ago. Injury to her back. No LOC.

## 2014-06-04 ENCOUNTER — Other Ambulatory Visit: Payer: Self-pay | Admitting: Rheumatology

## 2014-06-04 DIAGNOSIS — M4850XA Collapsed vertebra, not elsewhere classified, site unspecified, initial encounter for fracture: Secondary | ICD-10-CM | POA: Diagnosis not present

## 2014-06-04 DIAGNOSIS — M19042 Primary osteoarthritis, left hand: Secondary | ICD-10-CM | POA: Diagnosis not present

## 2014-06-04 DIAGNOSIS — IMO0001 Reserved for inherently not codable concepts without codable children: Secondary | ICD-10-CM

## 2014-06-04 DIAGNOSIS — M1990 Unspecified osteoarthritis, unspecified site: Secondary | ICD-10-CM | POA: Diagnosis not present

## 2014-06-04 DIAGNOSIS — M810 Age-related osteoporosis without current pathological fracture: Secondary | ICD-10-CM | POA: Diagnosis not present

## 2014-06-04 DIAGNOSIS — M19041 Primary osteoarthritis, right hand: Secondary | ICD-10-CM | POA: Diagnosis not present

## 2014-06-11 ENCOUNTER — Telehealth: Payer: Self-pay

## 2014-06-11 ENCOUNTER — Ambulatory Visit (INDEPENDENT_AMBULATORY_CARE_PROVIDER_SITE_OTHER): Payer: Medicare Other | Admitting: Gastroenterology

## 2014-06-11 ENCOUNTER — Encounter: Payer: Self-pay | Admitting: Gastroenterology

## 2014-06-11 VITALS — BP 132/86 | HR 96 | Ht 64.0 in | Wt 175.0 lb

## 2014-06-11 DIAGNOSIS — R112 Nausea with vomiting, unspecified: Secondary | ICD-10-CM

## 2014-06-11 DIAGNOSIS — R197 Diarrhea, unspecified: Secondary | ICD-10-CM | POA: Diagnosis not present

## 2014-06-11 MED ORDER — ONDANSETRON HCL 4 MG PO TABS: 4.0000 mg | ORAL_TABLET | Freq: Two times a day (BID) | ORAL | Status: DC | PRN

## 2014-06-11 MED ORDER — CHOLESTYRAMINE 4 G PO PACK: 4.0000 g | PACK | Freq: Two times a day (BID) | ORAL | Status: DC

## 2014-06-11 NOTE — Telephone Encounter (Signed)
Patients insurance will not cover Zofran.  She has never taken anything else but would like to try something else.  She is not sure what her insurance will cover.  Please change medication.

## 2014-06-11 NOTE — Progress Notes (Signed)
Review of pertinent gastrointestinal problems: 1. Adenomatous polyps; [facility] colonoscopy december 2011 (4 colon polyps, adenomatous, one piecemeal resected, recall at one year interval). Colonsocopy [facility] Hughs, 12/2011 found 3 polyps, all were TAs, recommended recall at 3 years. 2. Chronic diarrhea for years; sprue testing negative 03/2010    HPI: This is a   very pleasant 69 year old woman whom I last saw about 2-1/2 years ago. She is here with her husband today.  Chief complaint is chronic diarrhea, needs increase of dose and also refills.  New nausea  She normally takes cholestyramine one dose once daily and this mainly helps her chronic diarrhea. She has been on this for years. She finds she sometimes needs more than that.  She has had mild nausea since starting narcotic pain medicines for vertebral fracture. No vomiting.  She has no significant abdominal pains.     Past Medical History  Diagnosis Date  . Arthritis   . Hypertension   . Hyperlipidemia   . GERD (gastroesophageal reflux disease)     Past Surgical History  Procedure Laterality Date  . Tonsillectomy    . Partial hysterectomy    . Cholecystectomy    . Corneal transplant      bil  . Appendectomy      Current Outpatient Prescriptions  Medication Sig Dispense Refill  . alendronate (FOSAMAX) 70 MG tablet   2  . cholestyramine (QUESTRAN) 4 G packet MIX 1 PACKET (4 G TOTAL) AND TAKE BY MOUTH ONCE A DAY 60 each 0  . diphenhydrAMINE (BENADRYL) 25 MG tablet Take 25 mg by mouth as needed.    . docusate sodium (COLACE) 100 MG capsule Take 1 capsule (100 mg total) by mouth every 12 (twelve) hours. 60 capsule 0  . HYDROcodone-acetaminophen (VICODIN) 5-500 MG per tablet Take 1 tablet by mouth every 8 (eight) hours as needed.    Earney Navy Bicarbonate (ZEGERID OTC PO) Take 1 tablet by mouth as needed.    Marland Kitchen oxyCODONE-acetaminophen (PERCOCET/ROXICET) 5-325 MG per tablet Take 1 tablet by mouth every 6 (six) hours as  needed for severe pain. 30 tablet 0  . predniSONE (DELTASONE) 5 MG tablet Take 2.5 mg by mouth every other day.    . valsartan-hydrochlorothiazide (DIOVAN-HCT) 320-12.5 MG per tablet Take 0.5 tablets by mouth daily.     No current facility-administered medications for this visit.    Allergies as of 06/11/2014  . (No Known Allergies)    Family History  Problem Relation Age of Onset  . Colon cancer Sister 85    History   Social History  . Marital Status: Married    Spouse Name: N/A  . Number of Children: N/A  . Years of Education: N/A   Occupational History  . Not on file.   Social History Main Topics  . Smoking status: Never Smoker   . Smokeless tobacco: Never Used  . Alcohol Use: No  . Drug Use: No  . Sexual Activity: Not on file   Other Topics Concern  . Not on file   Social History Narrative     Physical Exam: BP 132/86 mmHg  Pulse 96  Ht 5\' 4"  (1.626 m)  Wt 175 lb (79.379 kg)  BMI 30.02 kg/m2 Constitutional: generally well-appearing Psychiatric: alert and oriented x3 Abdomen: soft, nontender, nondistended, no obvious ascites, no peritoneal signs, normal bowel sounds   Assessment and plan: 69 y.o. female with chronic loose stools, new nausea  Her nausea is almost certainly related to her narcotic pain medicine usage recently  after a vertebral fracture. I'm giving her prescription for Zofran that she will take twice daily on an as-needed basis. I am calling her in refills for her cholestyramine for her bile acid related diarrhea. She needs a bit more than she had needed in the past and so on given her prescription for 2 doses per day 1. She will return on an as-needed basis.   Owens Loffler, MD [facility] 06/11/2014, 10:23 AM

## 2014-06-11 NOTE — Patient Instructions (Signed)
Zofran called in for nausea, take one as needed twice daily. Cholestyramine refills given as well. Return as needed.

## 2014-06-12 ENCOUNTER — Ambulatory Visit (HOSPITAL_COMMUNITY)
Admission: RE | Admit: 2014-06-12 | Discharge: 2014-06-12 | Disposition: A | Discharge status: Home / Self Care | Payer: Medicare Other | Source: Ambulatory Visit | Attending: Rheumatology | Admitting: Rheumatology

## 2014-06-12 DIAGNOSIS — M4850XA Collapsed vertebra, not elsewhere classified, site unspecified, initial encounter for fracture: Secondary | ICD-10-CM

## 2014-06-12 DIAGNOSIS — M47817 Spondylosis without myelopathy or radiculopathy, lumbosacral region: Secondary | ICD-10-CM | POA: Diagnosis not present

## 2014-06-12 DIAGNOSIS — G894 Chronic pain syndrome: Secondary | ICD-10-CM | POA: Diagnosis not present

## 2014-06-12 DIAGNOSIS — Z79891 Long term (current) use of opiate analgesic: Secondary | ICD-10-CM | POA: Diagnosis not present

## 2014-06-12 DIAGNOSIS — M545 Low back pain: Secondary | ICD-10-CM | POA: Diagnosis not present

## 2014-06-12 DIAGNOSIS — M4856XA Collapsed vertebra, not elsewhere classified, lumbar region, initial encounter for fracture: Secondary | ICD-10-CM | POA: Diagnosis present

## 2014-06-12 DIAGNOSIS — W19XXXA Unspecified fall, initial encounter: Secondary | ICD-10-CM | POA: Insufficient documentation

## 2014-06-12 DIAGNOSIS — M4806 Spinal stenosis, lumbar region: Secondary | ICD-10-CM | POA: Insufficient documentation

## 2014-06-12 DIAGNOSIS — M4316 Spondylolisthesis, lumbar region: Secondary | ICD-10-CM | POA: Insufficient documentation

## 2014-06-12 DIAGNOSIS — M8000XS Age-related osteoporosis with current pathological fracture, unspecified site, sequela: Secondary | ICD-10-CM | POA: Diagnosis not present

## 2014-06-12 DIAGNOSIS — IMO0001 Reserved for inherently not codable concepts without codable children: Secondary | ICD-10-CM

## 2014-06-12 LAB — POCT I-STAT CREATININE: CREATININE: 0.8 mg/dL (ref 0.44–1.00)

## 2014-06-12 MED ORDER — GADOBENATE DIMEGLUMINE 529 MG/ML IV SOLN
20.0000 mL | Freq: Once | INTRAVENOUS | Status: AC | PRN
  Administered 2014-06-12: 16 mL via INTRAVENOUS

## 2014-06-14 MED ORDER — METOCLOPRAMIDE HCL 5 MG PO TABS: ORAL_TABLET | ORAL | Status: DC

## 2014-06-14 NOTE — Telephone Encounter (Signed)
Reglan ordered and sent to pharmacy.  Patient aware.

## 2014-06-14 NOTE — Telephone Encounter (Signed)
Ok, lets try low dose reglan.  5mg  pill, take one pill 2-3 times per day as needed for nausea.  Disp 50 with 3 refills  Thanks

## 2014-06-18 ENCOUNTER — Other Ambulatory Visit: Payer: Medicare Other

## 2014-06-19 ENCOUNTER — Other Ambulatory Visit (HOSPITAL_COMMUNITY): Payer: Self-pay | Admitting: Interventional Radiology

## 2014-06-19 DIAGNOSIS — M545 Low back pain: Secondary | ICD-10-CM

## 2014-06-19 DIAGNOSIS — IMO0002 Reserved for concepts with insufficient information to code with codable children: Secondary | ICD-10-CM

## 2014-06-24 ENCOUNTER — Ambulatory Visit (HOSPITAL_COMMUNITY)
Admission: RE | Admit: 2014-06-24 | Discharge: 2014-06-24 | Disposition: A | Discharge status: Home / Self Care | Payer: Medicare Other | Source: Ambulatory Visit | Attending: Interventional Radiology | Admitting: Interventional Radiology

## 2014-06-24 DIAGNOSIS — M4856XA Collapsed vertebra, not elsewhere classified, lumbar region, initial encounter for fracture: Secondary | ICD-10-CM | POA: Diagnosis not present

## 2014-06-24 DIAGNOSIS — IMO0002 Reserved for concepts with insufficient information to code with codable children: Secondary | ICD-10-CM

## 2014-06-24 DIAGNOSIS — M545 Low back pain: Secondary | ICD-10-CM

## 2014-07-03 ENCOUNTER — Other Ambulatory Visit (HOSPITAL_COMMUNITY): Payer: Self-pay | Admitting: Interventional Radiology

## 2014-07-03 DIAGNOSIS — M549 Dorsalgia, unspecified: Secondary | ICD-10-CM

## 2014-07-03 DIAGNOSIS — W19XXXA Unspecified fall, initial encounter: Secondary | ICD-10-CM

## 2014-07-03 DIAGNOSIS — S32020A Wedge compression fracture of second lumbar vertebra, initial encounter for closed fracture: Secondary | ICD-10-CM

## 2014-07-03 DIAGNOSIS — IMO0002 Reserved for concepts with insufficient information to code with codable children: Secondary | ICD-10-CM

## 2014-07-04 ENCOUNTER — Other Ambulatory Visit: Payer: Self-pay | Admitting: Radiology

## 2014-07-05 ENCOUNTER — Ambulatory Visit (HOSPITAL_COMMUNITY)
Admission: RE | Admit: 2014-07-05 | Discharge: 2014-07-05 | Disposition: A | Discharge status: Home / Self Care | Payer: Medicare Other | Source: Ambulatory Visit | Attending: Interventional Radiology | Admitting: Interventional Radiology

## 2014-07-05 ENCOUNTER — Encounter (HOSPITAL_COMMUNITY): Payer: Self-pay

## 2014-07-05 DIAGNOSIS — E785 Hyperlipidemia, unspecified: Secondary | ICD-10-CM | POA: Insufficient documentation

## 2014-07-05 DIAGNOSIS — Z79899 Other long term (current) drug therapy: Secondary | ICD-10-CM | POA: Insufficient documentation

## 2014-07-05 DIAGNOSIS — Z7983 Long term (current) use of bisphosphonates: Secondary | ICD-10-CM | POA: Diagnosis not present

## 2014-07-05 DIAGNOSIS — M1990 Unspecified osteoarthritis, unspecified site: Secondary | ICD-10-CM | POA: Insufficient documentation

## 2014-07-05 DIAGNOSIS — M4856XA Collapsed vertebra, not elsewhere classified, lumbar region, initial encounter for fracture: Secondary | ICD-10-CM | POA: Diagnosis not present

## 2014-07-05 DIAGNOSIS — M549 Dorsalgia, unspecified: Secondary | ICD-10-CM | POA: Diagnosis present

## 2014-07-05 DIAGNOSIS — K219 Gastro-esophageal reflux disease without esophagitis: Secondary | ICD-10-CM | POA: Diagnosis not present

## 2014-07-05 DIAGNOSIS — W19XXXA Unspecified fall, initial encounter: Secondary | ICD-10-CM | POA: Diagnosis not present

## 2014-07-05 DIAGNOSIS — S32020A Wedge compression fracture of second lumbar vertebra, initial encounter for closed fracture: Secondary | ICD-10-CM | POA: Insufficient documentation

## 2014-07-05 DIAGNOSIS — I10 Essential (primary) hypertension: Secondary | ICD-10-CM | POA: Insufficient documentation

## 2014-07-05 DIAGNOSIS — Y92009 Unspecified place in unspecified non-institutional (private) residence as the place of occurrence of the external cause: Secondary | ICD-10-CM | POA: Insufficient documentation

## 2014-07-05 LAB — PROTIME-INR
INR: 1.03 (ref 0.00–1.49)
PROTHROMBIN TIME: 13.7 s (ref 11.6–15.2)

## 2014-07-05 LAB — CBC WITH DIFFERENTIAL/PLATELET
BASOS ABS: 0.1 10*3/uL (ref 0.0–0.1)
BASOS PCT: 1 % (ref 0–1)
EOS PCT: 2 % (ref 0–5)
Eosinophils Absolute: 0.1 10*3/uL (ref 0.0–0.7)
HEMATOCRIT: 41.4 % (ref 36.0–46.0)
Hemoglobin: 13.6 g/dL (ref 12.0–15.0)
LYMPHS ABS: 2.3 10*3/uL (ref 0.7–4.0)
LYMPHS PCT: 35 % (ref 12–46)
MCH: 30.2 pg (ref 26.0–34.0)
MCHC: 32.9 g/dL (ref 30.0–36.0)
MCV: 92 fL (ref 78.0–100.0)
MONO ABS: 0.7 10*3/uL (ref 0.1–1.0)
Monocytes Relative: 10 % (ref 3–12)
Neutro Abs: 3.4 10*3/uL (ref 1.7–7.7)
Neutrophils Relative %: 52 % (ref 43–77)
PLATELETS: 195 10*3/uL (ref 150–400)
RBC: 4.5 MIL/uL (ref 3.87–5.11)
RDW: 14 % (ref 11.5–15.5)
WBC: 6.5 10*3/uL (ref 4.0–10.5)

## 2014-07-05 LAB — BASIC METABOLIC PANEL
Anion gap: 10 (ref 5–15)
BUN: 12 mg/dL (ref 6–20)
CALCIUM: 9.4 mg/dL (ref 8.9–10.3)
CO2: 25 mmol/L (ref 22–32)
Chloride: 105 mmol/L (ref 101–111)
Creatinine, Ser: 0.81 mg/dL (ref 0.44–1.00)
GFR calc Af Amer: >60 mL/min (ref >60)
GFR calc non Af Amer: >60 mL/min (ref >60)
Glucose, Bld: 92 mg/dL (ref 65–99)
Potassium: 3.8 mmol/L (ref 3.5–5.1)
Sodium: 140 mmol/L (ref 135–145)

## 2014-07-05 LAB — APTT: aPTT: 23 seconds — ABNORMAL LOW (ref 24–37)

## 2014-07-05 MED ORDER — BUPIVACAINE HCL (PF) 0.25 % IJ SOLN
INTRAMUSCULAR | Status: AC
Start: 2014-07-05 — End: 2014-07-05
  Filled 2014-07-05: qty 30

## 2014-07-05 MED ORDER — CEFAZOLIN SODIUM-DEXTROSE 2-3 GM-% IV SOLR
2.0000 g | Freq: Once | INTRAVENOUS | Status: AC
  Administered 2014-07-05: 2 g via INTRAVENOUS

## 2014-07-05 MED ORDER — MIDAZOLAM HCL 2 MG/2ML IJ SOLN
INTRAMUSCULAR | Status: AC | PRN
  Administered 2014-07-05 (×2): 1 mg via INTRAVENOUS

## 2014-07-05 MED ORDER — SODIUM CHLORIDE 0.9 % IV SOLN
INTRAVENOUS | Status: DC
  Administered 2014-07-05: 07:00:00 via INTRAVENOUS

## 2014-07-05 MED ORDER — MIDAZOLAM HCL 2 MG/2ML IJ SOLN
INTRAMUSCULAR | Status: AC
  Filled 2014-07-05: qty 2

## 2014-07-05 MED ORDER — SODIUM CHLORIDE 0.9 % IV SOLN: INTRAVENOUS | Status: AC

## 2014-07-05 MED ORDER — FENTANYL CITRATE (PF) 100 MCG/2ML IJ SOLN
INTRAMUSCULAR | Status: AC
  Filled 2014-07-05: qty 2

## 2014-07-05 MED ORDER — CEFAZOLIN SODIUM-DEXTROSE 2-3 GM-% IV SOLR
INTRAVENOUS | Status: AC
  Filled 2014-07-05: qty 50

## 2014-07-05 MED ORDER — BUPIVACAINE HCL (PF) 0.25 % IJ SOLN
INTRAMUSCULAR | Status: AC
  Filled 2014-07-05: qty 30

## 2014-07-05 MED ORDER — HYDROMORPHONE HCL 1 MG/ML IJ SOLN
INTRAMUSCULAR | Status: AC
  Filled 2014-07-05: qty 1

## 2014-07-05 MED ORDER — FENTANYL CITRATE (PF) 100 MCG/2ML IJ SOLN
INTRAMUSCULAR | Status: AC | PRN
  Administered 2014-07-05 (×2): 25 ug via INTRAVENOUS

## 2014-07-05 MED ORDER — IOHEXOL 300 MG/ML  SOLN
50.0000 mL | Freq: Once | INTRAMUSCULAR | Status: AC | PRN
  Administered 2014-07-05: 5 mL

## 2014-07-05 MED ORDER — TOBRAMYCIN SULFATE 1.2 G IJ SOLR
INTRAMUSCULAR | Status: AC
  Filled 2014-07-05: qty 1.2

## 2014-07-05 MED ORDER — GELATIN ABSORBABLE 12-7 MM EX MISC
CUTANEOUS | Status: AC
  Filled 2014-07-05: qty 1

## 2014-07-05 NOTE — Procedures (Signed)
S/P L2 VP bipedicular approach

## 2014-07-05 NOTE — H&P (Signed)
Chief Complaint: back pain  Referring Physician(s): Dr Unice Bailey  History of Present Illness: Tara Ingram is a 69 y.o. female   Pt fell at home Worsening back pain for few days PMD ordered MRI 06/12/14 Reveals acute Lumbar 2 fracture Consulted with Dr Estanislado Pandy regarding kyphoplasty/vertebroplasty Now scheduled for same  Past Medical History  Diagnosis Date  . Arthritis   . Hypertension   . Hyperlipidemia   . GERD (gastroesophageal reflux disease)     Past Surgical History  Procedure Laterality Date  . Tonsillectomy    . Partial hysterectomy    . Cholecystectomy    . Corneal transplant      bil  . Appendectomy      Allergies: Review of patient's allergies indicates no known allergies.  Medications: Prior to Admission medications   Medication Sig Start Date End Date Taking? Authorizing Provider  celecoxib (CELEBREX) 200 MG capsule Take 200 mg by mouth daily as needed for mild pain.   Yes Historical Provider, MD  cholestyramine Lucrezia Starch) 4 G packet Take 1 packet (4 g total) by mouth 2 (two) times daily. MIX 1 PACKET (4 G TOTAL) AND TAKE BY MOUTH ONCE A DAY 06/11/14  Yes Milus Banister, MD  diphenhydrAMINE (BENADRYL) 25 MG tablet Take 25 mg by mouth as needed for itching or allergies.    Yes Historical Provider, MD  docusate sodium (COLACE) 100 MG capsule Take 1 capsule (100 mg total) by mouth every 12 (twelve) hours. 05/23/14  Yes Evelina Bucy, MD  HYDROcodone-acetaminophen [facility]) 10-325 MG per tablet Take 1 tablet by mouth every 6 (six) hours as needed for moderate pain.   Yes Historical Provider, MD  metoCLOPramide (REGLAN) 5 MG tablet Take 2-3 times daily as needed for nausea. 06/14/14  Yes Milus Banister, MD  Omeprazole-Sodium Bicarbonate (ZEGERID OTC PO) Take 1 tablet by mouth as needed.   Yes Historical Provider, MD  ondansetron (ZOFRAN) 4 MG tablet Take 1 tablet (4 mg total) by mouth 2 (two) times daily as needed for nausea or vomiting. 06/11/14   Yes Milus Banister, MD  alendronate (FOSAMAX) 70 MG tablet Take 70 mg by mouth once a week.  06/04/14   Historical Provider, MD     Family History  Problem Relation Age of Onset  . Colon cancer Sister 41    History   Social History  . Marital Status: Married    Spouse Name: N/A  . Number of Children: N/A  . Years of Education: N/A   Social History Main Topics  . Smoking status: Never Smoker   . Smokeless tobacco: Never Used  . Alcohol Use: No  . Drug Use: No  . Sexual Activity: Not on file   Other Topics Concern  . None   Social History Narrative     Review of Systems: A 12 point ROS discussed and pertinent positives are indicated in the HPI above.  All other systems are negative.  Review of Systems  Constitutional: Positive for activity change. Negative for fever and appetite change.  Respiratory: Negative for cough and shortness of breath.   Cardiovascular: Negative for chest pain.  Musculoskeletal: Positive for back pain.  Neurological: Positive for weakness.  Psychiatric/Behavioral: Negative for behavioral problems and confusion.    Vital Signs: BP 134/56 mmHg  Pulse 69  Temp(Src) 97.4 F (36.3 C)  Resp 20  Ht 5\' 4"  (1.626 m)  Wt 81.647 kg (180 lb)  BMI 30.88 kg/m2  SpO2 99%  Physical Exam  Constitutional: She is oriented to person, place, and time. She appears well-nourished.  Cardiovascular: Normal rate, regular rhythm and normal heart sounds.   No murmur heard. Pulmonary/Chest: Effort normal and breath sounds normal. She has no wheezes.  Abdominal: Soft. Bowel sounds are normal. There is no tenderness.  Musculoskeletal: Normal range of motion.  Back pain  Neurological: She is alert and oriented to person, place, and time.  Skin: Skin is warm.  Psychiatric: She has a normal mood and affect. Judgment and thought content normal.  Nursing note and vitals reviewed.   Mallampati Score:  MD Evaluation Airway: WNL Heart: WNL Abdomen: WNL Chest/  Lungs: WNL ASA  Classification: 3 Mallampati/Airway Score: Two  Imaging: Mr Lumbar Spine W Wo Contrast  06/12/2014   CLINICAL DATA:  Vertebral compression fracture. Fell on deck on 05/04/2014. Low back pain.  EXAM: MRI LUMBAR SPINE WITHOUT AND WITH CONTRAST  TECHNIQUE: Multiplanar and multiecho pulse sequences of the lumbar spine were obtained without and with intravenous contrast.  CONTRAST:  18mL MULTIHANCE GADOBENATE DIMEGLUMINE 529 MG/ML IV SOLN  COMPARISON:  Lumbar spine radiographs 05/23/2014  FINDINGS: Again seen is mild lumbar dextroscoliosis and grade 1 anterolisthesis of L4 on L5. L2 compression fracture demonstrates progressive height loss compared to the prior radiographs, now approximately 75%. There is marrow edema and enhancement throughout the L2 vertebral body with mild extension into the pedicles bilaterally. There is buckling of the posterior vertebral body cortex into the spinal canal, protruding approximately 5 mm. No paravertebral soft tissue mass is seen.  Other lumbar vertebral body heights are preserved. Focal edema and enhancement posteriorly in the L1 inferior endplate and L3 superior endplate may be secondary to the subacute trauma or degenerative. There is diffuse lumbar disc desiccation, and mild disc space narrowing is present at L4-5. No destructive osseous lesions are identified. Conus medullaris terminates at the mid L2 level.  T11-12: Only imaged sagittally. Minimal disc bulging without stenosis.  T12-L1:  Minimal disc bulging without stenosis.  L1-2: Disc bulging, L2 retropulsion, and facet and ligamentum flavum hypertrophy result in moderate spinal stenosis. There is mild bilateral neural foraminal narrowing.  L2-3: Mild disc bulging, ligamentum flavum hypertrophy, and mild-to-moderate right and moderate to severe left facet arthrosis result in mild spinal stenosis and mild left neural foraminal stenosis.  L3-4: Mild disc bulging, ligamentum flavum hypertrophy, and moderate  right and severe left facet hypertrophy result in mild spinal stenosis without neural foraminal stenosis.  L4-5: Listhesis with disc uncovering, ligamentum flavum hypertrophy, and severe bilateral facet arthrosis result in moderate spinal stenosis, bilateral lateral recess stenosis, and minimal left neural foraminal narrowing. Tiny foci of ossification and/or synovial cysts within the ligamentum flavum bilaterally.  L5-S1: Minimal disc bulging and moderate to severe facet arthrosis without significant stenosis.  IMPRESSION: 1. L2 compression fracture with progressive, and now severe vertebral body height loss. Retropulsion contributes to moderate spinal stenosis. 2. Grade 1 anterolisthesis of L4 on L5 with moderate multifactorial spinal stenosis. The 3. Mild disc and moderate to severe facet degeneration at other levels as above.   Electronically Signed   By: Logan Bores   On: 06/12/2014 21:37   Ir Radiologist Eval & Mgmt  06/26/2014   EXAM: NEW PATIENT OFFICE VISIT  CHIEF COMPLAINT: Low back pain.  Current Pain Level: 1-10  HISTORY OF PRESENT ILLNESS: The patient developed severe low back pain following a fall.  Subsequent workup revealed an L2 compression fracture on MRI examination of the lumbar sacral spine.  The patient  is accompanied today by her husband.  According to her the pain is an 8-10 especially on prolonged standing, sitting or on bending forward.  The pain does not radiate into her lower extremities or into her toes.  There is no circumferential radiation either.  The pain is relieved by taking hydrocodone which she takes 1 tablet every 6 hours.  However, there is no complete relief of the pain. The patient denies any recent chills, fever or rigors. She denies any recent UTI symptoms of dysuria, frequency of micturition or of hematuria. She does complain of prominent constipation on account of the hydrocodone which is relieved by taking laxatives.  Her weight is steady.  Her appetite is good.   Past Medical History: Significant for osteoarthritis. Hyperlipidemia. Osteoporosis.  Medications: Cholestyramine. Diovan. Hydrocodone 10 mg/325 mg q.6 hours. Celebrex 200 mg as needed. Leflunomide once daily.  Allergies: No known allergies.  Social History: Married. Has no children. Four siblings. Denies smoking tobacco. Denies use of illicit chemicals. Denies taking alcohol.  Family History: Asthma. Headaches. Heart problems. Diabetes. High blood pressure.  REVIEW OF SYSTEMS: Negative apart from as mentioned above.  PHYSICAL EXAMINATION: Brief examination reveals the patient to be in mild distress. Affect normal. Neurologically intact. Is oriented to time, place, space. Speech and comprehension within normal limits. No cranial nerve abnormalities. No lateralizing motor or sensory or gait difficulties.  ASSESSMENT AND PLAN: The patient's recent MRI of the lumbosacral spine was reviewed with the patient and the patient's husband. The L2 fracture with compression deformity and diffuse edema was reviewed. Also demonstrated was the mild-to-moderate retropulsion at the site of fracture.  The option of vertebral body augmentation to relieve pain, and to prevent further collapse was reviewed in detail. The procedure of vertebroplasty, benefits, risks and alternatives were all reviewed in detail.  Questions were answered to their satisfaction. Both the patient and her husband are in agreement to proceed with vertebral body augmentation in the form of vertebroplasty. The will be undertaken as early as possible.  In the meantime, the patient has been advised to continue using her walker and to refrain from stooping, bending or lifting anything above 10 lbs. The patient and her husband leave with good understanding and agreement with the above management plan.   Electronically Signed   By: Luanne Bras M.D.   On: 06/24/2014 14:10    Labs:  CBC: No results for input(s): WBC, HGB, HCT, PLT in the last 8760  hours.  COAGS: No results for input(s): INR, APTT in the last 8760 hours.  BMP:  Recent Labs  06/12/14 1646  CREATININE 0.80    LIVER FUNCTION TESTS: No results for input(s): BILITOT, AST, ALT, ALKPHOS, PROT, ALBUMIN in the last 8760 hours.  TUMOR MARKERS: No results for input(s): AFPTM, CEA, CA199, CHROMGRNA in the last 8760 hours.  Assessment and Plan:  Painful Lumbar 2 fracture Scheduled for KP/VP Risks and Benefits discussed with the patient including, but not limited to education regarding the natural healing process of compression fractures without intervention, bleeding, infection, cement migration which may cause spinal cord damage, paralysis, pulmonary embolism or even death. All of the patient's questions were answered, patient is agreeable to proceed. Consent signed and in chart.    Thank you for this interesting consult.  I greatly enjoyed meeting Tara Ingram and look forward to participating in their care.  Signed: Kamala Kolton A 07/05/2014, 7:53 AM   I spent a total of  20 Minutes   in face to  face in clinical consultation, greater than 50% of which was counseling/coordinating care for L2 KP/VP

## 2014-07-05 NOTE — Discharge Instructions (Signed)
1.No stooping ,bending.lifting more than 10 lbs for 2 weeks 2.Use walker to ambulate for 2 weeks.Marland Kitchen 3.RTC  In 2 weeks  KYPHOPLASTY/VERTEBROPLASTY DISCHARGE INSTRUCTIONS  Medications: (check all that apply)     Resume all home medications as before procedure.    Continue your pain medications as prescribed as needed.  Over the next 3-5 days, decrease your pain medication as tolerated.  Over the counter medications (i.e. Tylenol, ibuprofen, and aleve) may be substituted once severe/moderate pain symptoms have subsided.   Wound Care: - Bandages may be removed the day following your procedure.  You may get your incision wet once bandages are removed.  Bandaids may be used to cover the incisions until scab formation.  Topical ointments are optional.  - If you develop a fever greater than 101 degrees, have increased skin redness at the incision sites or pus-like oozing from incisions occurring within 1 week of the procedure, contact radiology at [phone] or [phone].  - Ice pack to back for 15-20 minutes 2-3 time per day for first 2-3 days post procedure.  The ice will expedite muscle healing and help with the pain from the incisions.   Activity: - Bedrest today with limited activity for 24 hours post procedure.  - No driving for 48 hours.  - Increase your activity as tolerated after bedrest (with assistance if necessary).  - Refrain from any strenuous activity or heavy lifting (greater than 10 lbs.).   Follow up: - Contact radiology at [phone] or [phone] if any questions/concerns.  - A physician assistant from radiology will contact you in approximately 1 week.  - If a biopsy was performed at the time of your procedure, your referring physician should receive the results in usually 2-3 days.

## 2014-07-08 ENCOUNTER — Telehealth (HOSPITAL_COMMUNITY): Payer: Self-pay

## 2014-07-08 NOTE — Telephone Encounter (Signed)
Called pt to schedule 2 wk f/u, no answer..there was no voicemail to leave message. AW

## 2014-07-16 ENCOUNTER — Other Ambulatory Visit (HOSPITAL_COMMUNITY): Payer: Self-pay | Admitting: Interventional Radiology

## 2014-07-16 DIAGNOSIS — IMO0002 Reserved for concepts with insufficient information to code with codable children: Secondary | ICD-10-CM

## 2014-07-17 DIAGNOSIS — M47817 Spondylosis without myelopathy or radiculopathy, lumbosacral region: Secondary | ICD-10-CM | POA: Diagnosis not present

## 2014-07-17 DIAGNOSIS — G894 Chronic pain syndrome: Secondary | ICD-10-CM | POA: Diagnosis not present

## 2014-07-17 DIAGNOSIS — M8000XS Age-related osteoporosis with current pathological fracture, unspecified site, sequela: Secondary | ICD-10-CM | POA: Diagnosis not present

## 2014-07-17 DIAGNOSIS — M545 Low back pain: Secondary | ICD-10-CM | POA: Diagnosis not present

## 2014-07-22 ENCOUNTER — Ambulatory Visit (HOSPITAL_COMMUNITY)
Admission: RE | Admit: 2014-07-22 | Discharge: 2014-07-22 | Disposition: A | Discharge status: Home / Self Care | Payer: Medicare Other | Source: Ambulatory Visit | Attending: Interventional Radiology | Admitting: Interventional Radiology

## 2014-07-22 DIAGNOSIS — IMO0002 Reserved for concepts with insufficient information to code with codable children: Secondary | ICD-10-CM

## 2014-07-22 DIAGNOSIS — M545 Low back pain: Secondary | ICD-10-CM | POA: Diagnosis not present

## 2014-08-07 DIAGNOSIS — M47817 Spondylosis without myelopathy or radiculopathy, lumbosacral region: Secondary | ICD-10-CM | POA: Diagnosis not present

## 2014-08-14 ENCOUNTER — Ambulatory Visit (HOSPITAL_BASED_OUTPATIENT_CLINIC_OR_DEPARTMENT_OTHER): Payer: Medicare Other | Attending: Physical Medicine and Rehabilitation | Admitting: Radiology

## 2014-08-14 DIAGNOSIS — G4719 Other hypersomnia: Secondary | ICD-10-CM

## 2014-08-21 DIAGNOSIS — M47817 Spondylosis without myelopathy or radiculopathy, lumbosacral region: Secondary | ICD-10-CM | POA: Diagnosis not present

## 2014-09-25 DIAGNOSIS — M47817 Spondylosis without myelopathy or radiculopathy, lumbosacral region: Secondary | ICD-10-CM | POA: Diagnosis not present

## 2014-10-02 ENCOUNTER — Ambulatory Visit (HOSPITAL_BASED_OUTPATIENT_CLINIC_OR_DEPARTMENT_OTHER): Payer: Medicare Other | Attending: Physical Medicine and Rehabilitation | Admitting: Radiology

## 2014-10-02 VITALS — Ht 64.0 in | Wt 180.0 lb

## 2014-10-02 DIAGNOSIS — I10 Essential (primary) hypertension: Secondary | ICD-10-CM | POA: Diagnosis not present

## 2014-10-02 DIAGNOSIS — G894 Chronic pain syndrome: Secondary | ICD-10-CM

## 2014-10-02 DIAGNOSIS — G4733 Obstructive sleep apnea (adult) (pediatric): Secondary | ICD-10-CM | POA: Diagnosis not present

## 2014-10-02 DIAGNOSIS — R0683 Snoring: Secondary | ICD-10-CM | POA: Insufficient documentation

## 2014-10-02 DIAGNOSIS — R5383 Other fatigue: Secondary | ICD-10-CM | POA: Diagnosis not present

## 2014-10-07 ENCOUNTER — Encounter: Payer: Medicare Other | Admitting: Internal Medicine

## 2014-10-07 DIAGNOSIS — G4733 Obstructive sleep apnea (adult) (pediatric): Secondary | ICD-10-CM | POA: Diagnosis not present

## 2014-10-07 NOTE — Progress Notes (Signed)
Patient Name: Tara Ingram, Tara Ingram Date: 10/02/2014 Gender: Female D.O.B: Nov 15, 1945 Age (years): 7 Referring Provider: Margaretha Sheffield Height (inches): 64 Interpreting Physician: Baird Lyons MD, ABSM Weight (lbs): 180 RPSGT: Carolin Coy BMI: 31 MRN: [id number] Neck Size: 13.00 CLINICAL INFORMATION Sleep Study Type: Split Night CPAP  Indication for sleep study: Fatigue, Hypertension  Epworth Sleepiness Score: 14  SLEEP STUDY TECHNIQUE As per the AASM Manual for the Scoring of Sleep and Associated Events v2.3 (April 2016) with a hypopnea requiring 4% desaturations.  The channels recorded and monitored were frontal, central and occipital EEG, electrooculogram (EOG), submentalis EMG (chin), nasal and oral airflow, thoracic and abdominal wall motion, anterior tibialis EMG, snore microphone, electrocardiogram, and pulse oximetry. Continuous positive airway pressure (CPAP) was initiated when the patient met split night criteria and was titrated according to treat sleep-disordered breathing.  MEDICATIONS Medications taken by the patient : charted for review Medications administered by patient during sleep study : No sleep medicine administered.  RESPIRATORY PARAMETERS Diagnostic  Total AHI (/hr): 63.5 RDI (/hr): 72.1 OA Index (/hr): 17.9 CA Index (/hr): 16.8 REM AHI (/hr): 70.0 NREM AHI (/hr): 63.0 Supine AHI (/hr): 63.5 Non-supine AHI (/hr): N/A Min O2 Sat (%): 73.00 Mean O2 (%): 92.60 Time below 88% (min): 7.9   Titration  Optimal Pressure (cm): 13 AHI at Optimal Pressure (/hr): 0.0 Min O2 at Optimal Pressure (%): 93.0 Supine % at Optimal (%): 100 Sleep % at Optimal (%): 33    SLEEP ARCHITECTURE The recording time for the entire night was 374.8 minutes.  During a baseline period of 183.6 minutes, the patient slept for 154.0 minutes in REM and nonREM, yielding a sleep efficiency of 83.9%. Sleep onset after lights out was 2.3 minutes with a REM latency of 138.5  minutes. The patient spent 10.39% of the night in stage N1 sleep, 80.19% in stage N2 sleep, 1.62% in stage N3 and 7.79% in REM.  During the titration period of 189.8 minutes, the patient slept for 84.5 minutes in REM and nonREM, yielding a sleep efficiency of 44.5%. Sleep onset after CPAP initiation was 11.4 minutes with a REM latency of N/A minutes. The patient spent 22.49% of the night in stage N1 sleep, 76.92% in stage N2 sleep, 0.59% in stage N3 and 0.00% in REM.  CARDIAC DATA The 2 lead EKG demonstrated sinus rhythm. The mean heart rate was 64.74 beats per minute. Other EKG findings include: None.  LEG MOVEMENT DATA The total Periodic Limb Movements of Sleep (PLMS) were 0. The PLMS index was 0.00 .  IMPRESSIONS Severe obstructive sleep apnea occurred during the diagnostic portion of the study (AHI = 63.5/hour). An optimal PAP pressure was selected for this patient ( 13 cm of water) Mild central sleep apnea occurred during the diagnostic portion of the study (CAI = 16.8/hour). Severe oxygen desaturation was noted during the diagnostic portion of the study (Min O2 = 73.00%). The patient snored with Moderate snoring volume during the diagnostic portion of the study. No cardiac abnormalities were noted during this study. Clinically significant periodic limb movements did not occur during sleep.  DIAGNOSIS Obstructive Sleep Apnea (327.23 [G47.33 ICD-10])  RECOMMENDATIONS Trial of CPAP therapy on 13 cm H2O with a Small size Resmed Full Face Mask AirFit F10 for Her mask and heated humidification. Avoid alcohol, sedatives and other CNS depressants that may worsen sleep apnea and disrupt normal sleep architecture. Sleep hygiene should be reviewed to assess factors that may improve sleep quality. Weight management and regular exercise should be  initiated or continued.    Deneise Lever Diplomate, American Board of Sleep Medicine  ELECTRONICALLY SIGNED ON:  10/07/2014, 12:56 PM [place] PH: [phone]   FX: [phone] [place]

## 2014-10-23 DIAGNOSIS — M549 Dorsalgia, unspecified: Secondary | ICD-10-CM | POA: Diagnosis not present

## 2014-10-29 DIAGNOSIS — Z23 Encounter for immunization: Secondary | ICD-10-CM | POA: Diagnosis not present

## 2014-10-31 ENCOUNTER — Ambulatory Visit (INDEPENDENT_AMBULATORY_CARE_PROVIDER_SITE_OTHER): Payer: Medicare Other | Admitting: Pulmonary Disease

## 2014-10-31 ENCOUNTER — Encounter: Payer: Self-pay | Admitting: Pulmonary Disease

## 2014-10-31 VITALS — BP 140/82 | HR 71 | Temp 98.4°F | Ht 64.0 in | Wt 168.0 lb

## 2014-10-31 DIAGNOSIS — G4733 Obstructive sleep apnea (adult) (pediatric): Secondary | ICD-10-CM | POA: Diagnosis not present

## 2014-10-31 NOTE — Progress Notes (Deleted)
   Subjective:    Patient ID: Tara Ingram, female    DOB: 07-Jul-1945, 69 y.o.   MRN: [id number]  HPI    Review of Systems  Constitutional: Negative for fever and unexpected weight change.  HENT: Negative for congestion, dental problem, ear pain, nosebleeds, postnasal drip, rhinorrhea, sinus pressure, sneezing, sore throat and trouble swallowing.   Eyes: Negative for redness and itching.  Respiratory: Negative for cough, chest tightness, shortness of breath and wheezing.   Cardiovascular: Positive for leg swelling. Negative for palpitations.  Gastrointestinal: Positive for nausea. Negative for vomiting.  Genitourinary: Negative for dysuria.  Musculoskeletal: Positive for joint swelling.  Skin: Negative for rash.  Neurological: Negative for headaches.  Hematological: Does not bruise/bleed easily.  Psychiatric/Behavioral: Negative for dysphoric mood. The patient is not nervous/anxious.        Objective:   Physical Exam        Assessment & Plan:

## 2014-10-31 NOTE — Progress Notes (Signed)
Chief Complaint  Patient presents with  . SLEEP CONSULT    ot referred by Dr.  pt states she has a hard time staying asleep and wakes up restless in the morning.  pt states she has a lot of daytime napping.  sleep study done at [facility] in  August.  Epworht Score 7    History of Present Illness: Tara Ingram is a 69 y.o. female for evaluation of sleep problems.  She had her PCP changed recently.  During assessment she was noted to have c/o daytime sleepiness, and fatigue.  She has intermittent snoring, and can stop breathing at times while asleep.  She never dreams, and her mouth gets dry at night.  She goes to sleep at 11 pm.  She falls asleep immediately.  She wakes up 2 or 3 times to use the bathroom.  She gets out of bed at 9 am, but could sleep longer.  She feels tired in the morning.  She denies morning headache.  She does not use anything to help her fall sleep or stay awake.  She denies sleep walking, sleep talking, bruxism, or nightmares.  There is no history of restless legs.  She denies sleep hallucinations, sleep paralysis, or cataplexy.  The Epworth score is 7 out of 24.  Tests: PSG 10/02/14 >> AHI 63.5, SaO2 low 73%, CPAP 13 >> AHI 0  Tara Ingram  has a past medical history of Arthritis; Hypertension; Hyperlipidemia; GERD (gastroesophageal reflux disease); and Allergy.  Tara Ingram  has past surgical history that includes Tonsillectomy; Partial hysterectomy; Cholecystectomy; Corneal transplant; and Appendectomy.  Prior to Admission medications   Medication Sig Start Date End Date Taking? Authorizing Provider  cholestyramine (QUESTRAN) 4 G packet Take 1 packet (4 g total) by mouth 2 (two) times daily. MIX 1 PACKET (4 G TOTAL) AND TAKE BY MOUTH ONCE A DAY 06/11/14  Yes Milus Banister, MD  diphenhydrAMINE (BENADRYL) 25 MG tablet Take 25 mg by mouth as needed for itching or allergies.    Yes Historical Provider, MD  docusate sodium (COLACE) 100 MG capsule Take  1 capsule (100 mg total) by mouth every 12 (twelve) hours. 05/23/14  Yes Evelina Bucy, MD  HYDROcodone-acetaminophen [facility]) 10-325 MG per tablet Take 1 tablet by mouth every 6 (six) hours as needed for moderate pain.   Yes Historical Provider, MD  metoCLOPramide (REGLAN) 5 MG tablet Take 2-3 times daily as needed for nausea. 06/14/14  Yes Milus Banister, MD  ondansetron (ZOFRAN) 4 MG tablet Take 1 tablet (4 mg total) by mouth 2 (two) times daily as needed for nausea or vomiting. 06/11/14  Yes Milus Banister, MD  alendronate (FOSAMAX) 70 MG tablet Take 70 mg by mouth once a week.  06/04/14   Historical Provider, MD  celecoxib (CELEBREX) 200 MG capsule Take 200 mg by mouth daily as needed for mild pain.    Historical Provider, MD  Omeprazole-Sodium Bicarbonate (ZEGERID OTC PO) Take 1 tablet by mouth as needed.    Historical Provider, MD    No Known Allergies  Her family history includes Allergies in her father and mother; Asthma in her father; Colon cancer (age of onset: 77) in her sister; Heart disease in her father and mother; Rheumatologic disease in her mother.  She  reports that she has never smoked. She has never used smokeless tobacco. She reports that she does not drink alcohol or use illicit drugs.  Review of Systems  Constitutional: Negative for fever  and unexpected weight change.  HENT: Negative for congestion, dental problem, ear pain, nosebleeds, postnasal drip, rhinorrhea, sinus pressure, sneezing, sore throat and trouble swallowing.   Eyes: Negative for redness and itching.  Respiratory: Negative for cough, chest tightness, shortness of breath and wheezing.   Cardiovascular: Positive for leg swelling. Negative for palpitations.  Gastrointestinal: Positive for nausea. Negative for vomiting.  Genitourinary: Negative for dysuria.  Musculoskeletal: Positive for joint swelling.  Skin: Negative for rash.  Neurological: Negative for headaches.  Hematological: Does not bruise/bleed  easily.  Psychiatric/Behavioral: Negative for dysphoric mood. The patient is not nervous/anxious.    Physical Exam: BP 140/82 mmHg  Pulse 71  Temp(Src) 98.4 F (36.9 C) (Oral)  Ht 5\' 4"  (1.626 m)  Wt 168 lb (76.204 kg)  BMI 28.82 kg/m2  SpO2 97%  General - No distress ENT - No sinus tenderness, no oral exudate, no LAN, no thyromegaly, TM clear, pupils equal/reactive, MP 3 Cardiac - s1s2 regular, no murmur, pulses symmetric Chest - No wheeze/rales/dullness, good air entry, normal respiratory excursion Back - No focal tenderness Abd - Soft, non-tender, no organomegaly, + bowel sounds Ext - No edema Neuro - Normal strength, cranial nerves intact Skin - No rashes Psych - Normal mood, and behavior  Discussion: She has snoring, apnea, sleep disruption and daytime sleepiness.  She has hx of HTN.  Her sleep study showed severe obstructive sleep apnea.  I have reviewed the recent sleep study results with the patient.  We discussed how sleep apnea can affect various health problems including risks for hypertension, cardiovascular disease, and diabetes.  We also discussed how sleep disruption can increase risks for accident, such as while driving.  Weight loss as a means of improving sleep apnea was also reviewed.  Additional treatment options discussed were CPAP therapy, oral appliance, and surgical intervention.  Assessment/plan:  Obstructive sleep apnea. Plan: - will arrange for CPAP 13 cm H2O   Chesley Mires, M.D. Pager [phone]

## 2014-10-31 NOTE — Patient Instructions (Signed)
Will arrange for CPAP set up Follow up in 2 months after CPAP with Dr. Halford Chessman or Tammy Parrett

## 2014-11-19 DIAGNOSIS — M47817 Spondylosis without myelopathy or radiculopathy, lumbosacral region: Secondary | ICD-10-CM | POA: Diagnosis not present

## 2014-11-19 DIAGNOSIS — M545 Low back pain: Secondary | ICD-10-CM | POA: Diagnosis not present

## 2014-11-19 DIAGNOSIS — R269 Unspecified abnormalities of gait and mobility: Secondary | ICD-10-CM | POA: Diagnosis not present

## 2014-11-19 DIAGNOSIS — M6281 Muscle weakness (generalized): Secondary | ICD-10-CM | POA: Diagnosis not present

## 2014-11-19 DIAGNOSIS — M8000XS Age-related osteoporosis with current pathological fracture, unspecified site, sequela: Secondary | ICD-10-CM | POA: Diagnosis not present

## 2014-11-19 DIAGNOSIS — G894 Chronic pain syndrome: Secondary | ICD-10-CM | POA: Diagnosis not present

## 2014-11-20 DIAGNOSIS — H2513 Age-related nuclear cataract, bilateral: Secondary | ICD-10-CM | POA: Diagnosis not present

## 2014-11-20 DIAGNOSIS — Z947 Corneal transplant status: Secondary | ICD-10-CM | POA: Diagnosis not present

## 2014-12-13 DIAGNOSIS — M545 Low back pain: Secondary | ICD-10-CM | POA: Diagnosis not present

## 2014-12-13 DIAGNOSIS — R269 Unspecified abnormalities of gait and mobility: Secondary | ICD-10-CM | POA: Diagnosis not present

## 2014-12-13 DIAGNOSIS — M6281 Muscle weakness (generalized): Secondary | ICD-10-CM | POA: Diagnosis not present

## 2014-12-17 DIAGNOSIS — G894 Chronic pain syndrome: Secondary | ICD-10-CM | POA: Diagnosis not present

## 2014-12-17 DIAGNOSIS — M47817 Spondylosis without myelopathy or radiculopathy, lumbosacral region: Secondary | ICD-10-CM | POA: Diagnosis not present

## 2014-12-17 DIAGNOSIS — R269 Unspecified abnormalities of gait and mobility: Secondary | ICD-10-CM | POA: Diagnosis not present

## 2014-12-17 DIAGNOSIS — M545 Low back pain: Secondary | ICD-10-CM | POA: Diagnosis not present

## 2014-12-17 DIAGNOSIS — M6281 Muscle weakness (generalized): Secondary | ICD-10-CM | POA: Diagnosis not present

## 2014-12-17 DIAGNOSIS — M8000XS Age-related osteoporosis with current pathological fracture, unspecified site, sequela: Secondary | ICD-10-CM | POA: Diagnosis not present

## 2014-12-19 DIAGNOSIS — M545 Low back pain: Secondary | ICD-10-CM | POA: Diagnosis not present

## 2014-12-19 DIAGNOSIS — R269 Unspecified abnormalities of gait and mobility: Secondary | ICD-10-CM | POA: Diagnosis not present

## 2014-12-19 DIAGNOSIS — M6281 Muscle weakness (generalized): Secondary | ICD-10-CM | POA: Diagnosis not present

## 2014-12-30 ENCOUNTER — Encounter: Payer: Self-pay | Admitting: Pulmonary Disease

## 2014-12-30 DIAGNOSIS — G4733 Obstructive sleep apnea (adult) (pediatric): Secondary | ICD-10-CM | POA: Insufficient documentation

## 2015-01-01 DIAGNOSIS — R269 Unspecified abnormalities of gait and mobility: Secondary | ICD-10-CM | POA: Diagnosis not present

## 2015-01-01 DIAGNOSIS — M545 Low back pain: Secondary | ICD-10-CM | POA: Diagnosis not present

## 2015-01-01 DIAGNOSIS — M6281 Muscle weakness (generalized): Secondary | ICD-10-CM | POA: Diagnosis not present

## 2015-01-02 DIAGNOSIS — R269 Unspecified abnormalities of gait and mobility: Secondary | ICD-10-CM | POA: Diagnosis not present

## 2015-01-02 DIAGNOSIS — M545 Low back pain: Secondary | ICD-10-CM | POA: Diagnosis not present

## 2015-01-02 DIAGNOSIS — M6281 Muscle weakness (generalized): Secondary | ICD-10-CM | POA: Diagnosis not present

## 2015-01-08 ENCOUNTER — Telehealth: Payer: Self-pay | Admitting: Pulmonary Disease

## 2015-01-08 DIAGNOSIS — G4733 Obstructive sleep apnea (adult) (pediatric): Secondary | ICD-10-CM

## 2015-01-08 DIAGNOSIS — R269 Unspecified abnormalities of gait and mobility: Secondary | ICD-10-CM | POA: Diagnosis not present

## 2015-01-08 DIAGNOSIS — M6281 Muscle weakness (generalized): Secondary | ICD-10-CM | POA: Diagnosis not present

## 2015-01-08 DIAGNOSIS — M545 Low back pain: Secondary | ICD-10-CM | POA: Diagnosis not present

## 2015-01-08 NOTE — Telephone Encounter (Signed)
On 10/31/14 order was placed for mask of choice when order was placed for CPAP LMTCB x1 for pt

## 2015-01-09 NOTE — Telephone Encounter (Signed)
Spoke with pt, states that her current cpap mask is leaking at night.  Pt received a new mask the beginning of October 2016.  Pt states that she's had [facility] try to adjust her mask for a better fit, which has not helped.  Pt requesting a new order for a new cpap mask.  VS are you ok with this order?  Thanks!

## 2015-01-09 NOTE — Telephone Encounter (Signed)
lmtcb x2 for pt. 

## 2015-01-09 NOTE — Telephone Encounter (Signed)
Order for new mask sent.  Pt aware.  Nothing further needed.

## 2015-01-09 NOTE — Telephone Encounter (Signed)
Patient states NOT to call her cell, it does not work.  Call her at home at [phone].

## 2015-01-09 NOTE — Telephone Encounter (Signed)
Okay to send order for new CPAP mask. 

## 2015-01-09 NOTE — Telephone Encounter (Signed)
lmtcb for pt.  

## 2015-01-09 NOTE — Telephone Encounter (Signed)
Patient returned call, [phone].

## 2015-01-10 DIAGNOSIS — M545 Low back pain: Secondary | ICD-10-CM | POA: Diagnosis not present

## 2015-01-10 DIAGNOSIS — M6281 Muscle weakness (generalized): Secondary | ICD-10-CM | POA: Diagnosis not present

## 2015-01-10 DIAGNOSIS — R269 Unspecified abnormalities of gait and mobility: Secondary | ICD-10-CM | POA: Diagnosis not present

## 2015-01-13 ENCOUNTER — Encounter: Payer: Self-pay | Admitting: Adult Health

## 2015-01-13 ENCOUNTER — Ambulatory Visit (INDEPENDENT_AMBULATORY_CARE_PROVIDER_SITE_OTHER): Payer: Medicare Other | Admitting: Adult Health

## 2015-01-13 VITALS — BP 118/78 | HR 81 | Temp 98.1°F | Ht 64.0 in | Wt 177.0 lb

## 2015-01-13 DIAGNOSIS — G4733 Obstructive sleep apnea (adult) (pediatric): Secondary | ICD-10-CM

## 2015-01-13 NOTE — Assessment & Plan Note (Signed)
Improved control on nocturnal C Pap We'll try a nasal mask  Plan  Continue on CPAP At bedtime   Goal is to wear for at least 6hr each night.  Order sent for nasal mask trial  Work on weight loss  Do not drive if sleepy  Follow up Dr. Halford Chessman  In 4-6 months and As needed

## 2015-01-13 NOTE — Patient Instructions (Signed)
Continue on CPAP At bedtime   Goal is to wear for at least 6hr each night.  Order sent for nasal mask trial  Work on weight loss  Do not drive if sleepy  Follow up Dr. Halford Chessman  In 4-6 months and As needed

## 2015-01-13 NOTE — Progress Notes (Signed)
Reviewed and agree with assessment/plan. 

## 2015-01-13 NOTE — Progress Notes (Signed)
Subjective:    Patient ID: Tara Ingram, female    DOB: December 30, 1945, 69 y.o.   MRN: [id number]  HPI 69 year old female seen for sleep consult 10/31/2014 with Dr. Halford Chessman   Test:  PSG 10/02/14 >> AHI 63.5, SaO2 low 73%, CPAP 13 >> AHI 0  01/13/2015 Follow up : OSA  Patient returns for a two-month follow-up. She was recently found to have severe sleep apnea. She was started on nocturnal C Pap. Patient reports she is sleeping slightly better since starting her C Pap machine.Marland Kitchen Unsure if she is more rested or not during the daytime. Denies any excessive daytime sleepiness. Download November 12 through December 11 showed excellent compliance with average usage around  5.75 hours. AHI 7.9. Positive leaks. Is on a set pressure of 13 cm of H2O Denies any chest pain, orthopnea, PND, or increased leg swelling. She would like to try new mask. She does not like her nasal pillows.   Past Medical History  Diagnosis Date  . Arthritis   . Hypertension   . Hyperlipidemia   . GERD (gastroesophageal reflux disease)   . Allergy    Current Outpatient Prescriptions on File Prior to Visit  Medication Sig Dispense Refill  . celecoxib (CELEBREX) 200 MG capsule Take 200 mg by mouth daily as needed for mild pain.    . cholestyramine (QUESTRAN) 4 G packet Take 1 packet (4 g total) by mouth 2 (two) times daily. MIX 1 PACKET (4 G TOTAL) AND TAKE BY MOUTH ONCE A DAY 60 each 11  . diphenhydrAMINE (BENADRYL) 25 MG tablet Take 25 mg by mouth as needed for itching or allergies.     Marland Kitchen HYDROcodone-acetaminophen (NORCO) 10-325 MG per tablet Take 1 tablet by mouth every 6 (six) hours as needed for moderate pain.    Marland Kitchen metoCLOPramide (REGLAN) 5 MG tablet Take 2-3 times daily as needed for nausea. 50 tablet 3  . ondansetron (ZOFRAN) 4 MG tablet Take 1 tablet (4 mg total) by mouth 2 (two) times daily as needed for nausea or vomiting. 60 tablet 1  . alendronate (FOSAMAX) 70 MG tablet Take 70 mg by mouth once a week.   2  .  docusate sodium (COLACE) 100 MG capsule Take 1 capsule (100 mg total) by mouth every 12 (twelve) hours. (Patient not taking: Reported on 01/13/2015) 60 capsule 0  . Omeprazole-Sodium Bicarbonate (ZEGERID OTC PO) Take 1 tablet by mouth as needed.     No current facility-administered medications on file prior to visit.      Review of Systems Constitutional:   No  weight loss, night sweats,  Fevers, chills,  +fatigue, or  lassitude.  HEENT:   No headaches,  Difficulty swallowing,  Tooth/dental problems, or  Sore throat,                No sneezing, itching, ear ache, nasal congestion, post nasal drip,   CV:  No chest pain,  Orthopnea, PND, swelling in lower extremities, anasarca, dizziness, palpitations, syncope.   GI  No heartburn, indigestion, abdominal pain, nausea, vomiting, diarrhea, change in bowel habits, loss of appetite, bloody stools.   Resp: No shortness of breath with exertion or at rest.  No excess mucus, no productive cough,  No non-productive cough,  No coughing up of blood.  No change in color of mucus.  No wheezing.  No chest wall deformity  Skin: no rash or lesions.  GU: no dysuria, change in color of urine, no urgency or frequency.  No  flank pain, no hematuria   MS:  No joint pain or swelling.  No decreased range of motion.  No back pain.  Psych:  No change in mood or affect. No depression or anxiety.  No memory loss.         Objective:   Physical Exam GEN: A/Ox3; pleasant , NAD, elderly   HEENT:  [place]/AT,  EACs-clear, TMs-wnl, NOSE-clear, THROAT-clear, no lesions, no postnasal drip or exudate noted. Class 2 MP airway   NECK:  Supple w/ fair ROM; no JVD; normal carotid impulses w/o bruits; no thyromegaly or nodules palpated; no lymphadenopathy.  RESP  Clear  P & A; w/o, wheezes/ rales/ or rhonchi.no accessory muscle use, no dullness to percussion  CARD:  RRR, no m/r/g  , no peripheral edema, pulses intact, no cyanosis or clubbing.  GI:   Soft & nt; nml bowel  sounds; no organomegaly or masses detected.  Musco: Warm bil, no deformities or joint swelling noted.   Neuro: alert, no focal deficits noted.    Skin: Warm, no lesions or rashes        Assessment & Plan:

## 2015-01-13 NOTE — Addendum Note (Signed)
Addended by: Osa Craver on: 01/13/2015 03:07 PM   Modules accepted: Orders

## 2015-01-14 DIAGNOSIS — R269 Unspecified abnormalities of gait and mobility: Secondary | ICD-10-CM | POA: Diagnosis not present

## 2015-01-14 DIAGNOSIS — M545 Low back pain: Secondary | ICD-10-CM | POA: Diagnosis not present

## 2015-01-14 DIAGNOSIS — G894 Chronic pain syndrome: Secondary | ICD-10-CM | POA: Diagnosis not present

## 2015-01-14 DIAGNOSIS — M6281 Muscle weakness (generalized): Secondary | ICD-10-CM | POA: Diagnosis not present

## 2015-01-14 DIAGNOSIS — M8000XS Age-related osteoporosis with current pathological fracture, unspecified site, sequela: Secondary | ICD-10-CM | POA: Diagnosis not present

## 2015-01-14 DIAGNOSIS — M47817 Spondylosis without myelopathy or radiculopathy, lumbosacral region: Secondary | ICD-10-CM | POA: Diagnosis not present

## 2015-01-16 ENCOUNTER — Ambulatory Visit
Admission: RE | Admit: 2015-01-16 | Discharge: 2015-01-16 | Disposition: A | Discharge status: Home / Self Care | Payer: Medicare Other | Source: Ambulatory Visit | Attending: Physical Medicine and Rehabilitation | Admitting: Physical Medicine and Rehabilitation

## 2015-01-16 ENCOUNTER — Other Ambulatory Visit: Payer: Self-pay | Admitting: Physical Medicine and Rehabilitation

## 2015-01-16 DIAGNOSIS — R269 Unspecified abnormalities of gait and mobility: Secondary | ICD-10-CM | POA: Diagnosis not present

## 2015-01-16 DIAGNOSIS — M5136 Other intervertebral disc degeneration, lumbar region: Secondary | ICD-10-CM | POA: Diagnosis not present

## 2015-01-16 DIAGNOSIS — M25561 Pain in right knee: Secondary | ICD-10-CM

## 2015-01-16 DIAGNOSIS — M545 Low back pain: Secondary | ICD-10-CM

## 2015-01-16 DIAGNOSIS — M179 Osteoarthritis of knee, unspecified: Secondary | ICD-10-CM | POA: Diagnosis not present

## 2015-01-16 DIAGNOSIS — M6281 Muscle weakness (generalized): Secondary | ICD-10-CM | POA: Diagnosis not present

## 2015-01-16 DIAGNOSIS — M25562 Pain in left knee: Principal | ICD-10-CM

## 2015-02-05 DIAGNOSIS — M6281 Muscle weakness (generalized): Secondary | ICD-10-CM | POA: Diagnosis not present

## 2015-02-05 DIAGNOSIS — R269 Unspecified abnormalities of gait and mobility: Secondary | ICD-10-CM | POA: Diagnosis not present

## 2015-02-05 DIAGNOSIS — M545 Low back pain: Secondary | ICD-10-CM | POA: Diagnosis not present

## 2015-02-13 DIAGNOSIS — M6281 Muscle weakness (generalized): Secondary | ICD-10-CM | POA: Diagnosis not present

## 2015-02-13 DIAGNOSIS — M545 Low back pain: Secondary | ICD-10-CM | POA: Diagnosis not present

## 2015-02-13 DIAGNOSIS — R269 Unspecified abnormalities of gait and mobility: Secondary | ICD-10-CM | POA: Diagnosis not present

## 2015-02-14 ENCOUNTER — Institutional Professional Consult (permissible substitution): Payer: Medicare Other | Admitting: Internal Medicine

## 2015-02-18 DIAGNOSIS — R269 Unspecified abnormalities of gait and mobility: Secondary | ICD-10-CM | POA: Diagnosis not present

## 2015-02-18 DIAGNOSIS — M6281 Muscle weakness (generalized): Secondary | ICD-10-CM | POA: Diagnosis not present

## 2015-02-18 DIAGNOSIS — M545 Low back pain: Secondary | ICD-10-CM | POA: Diagnosis not present

## 2015-02-20 DIAGNOSIS — M6281 Muscle weakness (generalized): Secondary | ICD-10-CM | POA: Diagnosis not present

## 2015-02-20 DIAGNOSIS — R269 Unspecified abnormalities of gait and mobility: Secondary | ICD-10-CM | POA: Diagnosis not present

## 2015-02-20 DIAGNOSIS — M545 Low back pain: Secondary | ICD-10-CM | POA: Diagnosis not present

## 2015-02-21 DIAGNOSIS — G894 Chronic pain syndrome: Secondary | ICD-10-CM | POA: Diagnosis not present

## 2015-02-21 DIAGNOSIS — M47817 Spondylosis without myelopathy or radiculopathy, lumbosacral region: Secondary | ICD-10-CM | POA: Diagnosis not present

## 2015-02-21 DIAGNOSIS — M545 Low back pain: Secondary | ICD-10-CM | POA: Diagnosis not present

## 2015-02-21 DIAGNOSIS — M8000XS Age-related osteoporosis with current pathological fracture, unspecified site, sequela: Secondary | ICD-10-CM | POA: Diagnosis not present

## 2015-02-24 ENCOUNTER — Encounter: Payer: Self-pay | Admitting: Gastroenterology

## 2015-03-13 DIAGNOSIS — R269 Unspecified abnormalities of gait and mobility: Secondary | ICD-10-CM | POA: Diagnosis not present

## 2015-03-13 DIAGNOSIS — M6281 Muscle weakness (generalized): Secondary | ICD-10-CM | POA: Diagnosis not present

## 2015-03-13 DIAGNOSIS — M545 Low back pain: Secondary | ICD-10-CM | POA: Diagnosis not present

## 2015-03-18 DIAGNOSIS — R269 Unspecified abnormalities of gait and mobility: Secondary | ICD-10-CM | POA: Diagnosis not present

## 2015-03-18 DIAGNOSIS — M6281 Muscle weakness (generalized): Secondary | ICD-10-CM | POA: Diagnosis not present

## 2015-03-18 DIAGNOSIS — M545 Low back pain: Secondary | ICD-10-CM | POA: Diagnosis not present

## 2015-03-20 DIAGNOSIS — M545 Low back pain: Secondary | ICD-10-CM | POA: Diagnosis not present

## 2015-03-20 DIAGNOSIS — R269 Unspecified abnormalities of gait and mobility: Secondary | ICD-10-CM | POA: Diagnosis not present

## 2015-03-20 DIAGNOSIS — M6281 Muscle weakness (generalized): Secondary | ICD-10-CM | POA: Diagnosis not present

## 2015-03-24 DIAGNOSIS — M545 Low back pain: Secondary | ICD-10-CM | POA: Diagnosis not present

## 2015-03-24 DIAGNOSIS — M6281 Muscle weakness (generalized): Secondary | ICD-10-CM | POA: Diagnosis not present

## 2015-03-24 DIAGNOSIS — R269 Unspecified abnormalities of gait and mobility: Secondary | ICD-10-CM | POA: Diagnosis not present

## 2015-03-25 ENCOUNTER — Ambulatory Visit (AMBULATORY_SURGERY_CENTER): Payer: Self-pay | Admitting: *Deleted

## 2015-03-25 VITALS — Ht 62.0 in | Wt 172.2 lb

## 2015-03-25 DIAGNOSIS — Z86010 Personal history of colonic polyps: Secondary | ICD-10-CM

## 2015-03-25 MED ORDER — SUPREP BOWEL PREP KIT 17.5-3.13-1.6 GM/177ML PO SOLN: 1.0000 | Freq: Once | ORAL | Status: DC

## 2015-03-25 NOTE — Progress Notes (Signed)
Patient denies any allergies to egg or soy products. Patient denies complications with anesthesia/sedation.  Patient denies oxygen use at home and denies diet medications. Emmi instructions for colonoscopy explained but patient denied.     

## 2015-04-03 DIAGNOSIS — R269 Unspecified abnormalities of gait and mobility: Secondary | ICD-10-CM | POA: Diagnosis not present

## 2015-04-03 DIAGNOSIS — M545 Low back pain: Secondary | ICD-10-CM | POA: Diagnosis not present

## 2015-04-03 DIAGNOSIS — M6281 Muscle weakness (generalized): Secondary | ICD-10-CM | POA: Diagnosis not present

## 2015-04-08 ENCOUNTER — Ambulatory Visit (AMBULATORY_SURGERY_CENTER): Payer: Medicare Other | Admitting: Gastroenterology

## 2015-04-08 ENCOUNTER — Encounter: Payer: Self-pay | Admitting: Gastroenterology

## 2015-04-08 VITALS — BP 111/79 | HR 72 | Temp 98.1°F | Resp 11 | Ht 62.0 in | Wt 172.0 lb

## 2015-04-08 DIAGNOSIS — D128 Benign neoplasm of rectum: Secondary | ICD-10-CM

## 2015-04-08 DIAGNOSIS — K219 Gastro-esophageal reflux disease without esophagitis: Secondary | ICD-10-CM | POA: Diagnosis not present

## 2015-04-08 DIAGNOSIS — D129 Benign neoplasm of anus and anal canal: Secondary | ICD-10-CM

## 2015-04-08 DIAGNOSIS — I10 Essential (primary) hypertension: Secondary | ICD-10-CM | POA: Diagnosis not present

## 2015-04-08 DIAGNOSIS — Z86010 Personal history of colonic polyps: Secondary | ICD-10-CM

## 2015-04-08 DIAGNOSIS — G4733 Obstructive sleep apnea (adult) (pediatric): Secondary | ICD-10-CM | POA: Diagnosis not present

## 2015-04-08 MED ORDER — SODIUM CHLORIDE 0.9 % IV SOLN: 500.0000 mL | INTRAVENOUS | Status: DC

## 2015-04-08 NOTE — Patient Instructions (Signed)
YOU HAD AN ENDOSCOPIC PROCEDURE TODAY AT THE [facility]:   Refer to the procedure report that was given to you for any specific questions about what was found during the examination.  If the procedure report does not answer your questions, please call your gastroenterologist to clarify.  If you requested that your care partner not be given the details of your procedure findings, then the procedure report has been included in a sealed envelope for you to review at your convenience later.  YOU SHOULD EXPECT: Some feelings of bloating in the abdomen. Passage of more gas than usual.  Walking can help get rid of the air that was put into your GI tract during the procedure and reduce the bloating. If you had a lower endoscopy (such as a colonoscopy or flexible sigmoidoscopy) you may notice spotting of blood in your stool or on the toilet paper. If you underwent a bowel prep for your procedure, you may not have a normal bowel movement for a few days.  Please Note:  You might notice some irritation and congestion in your nose or some drainage.  This is from the oxygen used during your procedure.  There is no need for concern and it should clear up in a day or so.  SYMPTOMS TO REPORT IMMEDIATELY:   Following lower endoscopy (colonoscopy or flexible sigmoidoscopy):  Excessive amounts of blood in the stool  Significant tenderness or worsening of abdominal pains  Swelling of the abdomen that is new, acute  Fever of 100F or higher    For urgent or emergent issues, a gastroenterologist can be reached at any hour by calling [phone].   DIET: Your first meal following the procedure should be a small meal and then it is ok to progress to your normal diet. Heavy or fried foods are harder to digest and may make you feel nauseous or bloated.  Likewise, meals heavy in dairy and vegetables can increase bloating.  Drink plenty of fluids but you should avoid alcoholic beverages for 24  hours.  ACTIVITY:  You should plan to take it easy for the rest of today and you should NOT DRIVE or use heavy machinery until tomorrow (because of the sedation medicines used during the test).    FOLLOW UP: Our staff will call the number listed on your records the next business day following your procedure to check on you and address any questions or concerns that you may have regarding the information given to you following your procedure. If we do not reach you, we will leave a message.  However, if you are feeling well and you are not experiencing any problems, there is no need to return our call.  We will assume that you have returned to your regular daily activities without incident.  If any biopsies were taken you will be contacted by phone or by letter within the next 1-3 weeks.  Please call us at [phone] if you have not heard about the biopsies in 3 weeks.    SIGNATURES/CONFIDENTIALITY: You and/or your care partner have signed paperwork which will be entered into your electronic medical record.  These signatures attest to the fact that that the information above on your After Visit Summary has been reviewed and is understood.  Full responsibility of the confidentiality of this discharge information lies with you and/or your care-partner.   Resume medications. Information given on polyps,diverticulosis and high fiber diet. 

## 2015-04-08 NOTE — Progress Notes (Signed)
Called to room to assist during endoscopic procedure.  Patient ID and intended procedure confirmed with present staff. Received instructions for my participation in the procedure from the performing physician.  

## 2015-04-08 NOTE — Op Note (Signed)
[facility]  [address] [place], [id number]   COLONOSCOPY PROCEDURE REPORT  PATIENT: Tara Ingram, Tara Ingram  MR#: [id number] BIRTHDATE: October 05, 1945 , 32  yrs. old GENDER: female ENDOSCOPIST: Milus Banister, MD PROCEDURE DATE:  04/08/2015 PROCEDURE:   Colonoscopy, surveillance and Colonoscopy with snare polypectomy First Screening Colonoscopy - Avg.  risk and is 50 yrs.  old or older - No.  Prior Negative Screening - Now for repeat screening. N/A  History of Adenoma - Now for follow-up colonoscopy & has been > or = to 3 yrs.  Yes hx of adenoma.  Has been 3 or more years since last colonoscopy.  Polyps removed today? Yes ASA CLASS:   Class II INDICATIONS:Surveillance due to prior colonic neoplasia and Adenomatous polyps; Ardis Hughs colonoscopy december 2011 (4 colon polyps, adenomatous, one piecemeal resected from rectum, recall at one year interval).  Colonsocopy Ar[facility] Hughs, 12/2011 found 3 polyps, all were TAs, one in rectum again, recommended recall at 3 years.  MEDICATIONS: Monitored anesthesia care and Propofol 130 mg IV  DESCRIPTION OF PROCEDURE:   After the risks benefits and alternatives of the procedure were thoroughly explained, informed consent was obtained.  The digital rectal exam revealed no abnormalities of the rectum.   The LB [id number]  endoscope was introduced through the anus and advanced to the cecum, which was identified by both the appendix and ileocecal valve. No adverse events experienced.   The quality of the prep was excellent.  The instrument was then slowly withdrawn as the colon was fully examined. Estimated blood loss is zero unless otherwise noted in this procedure report.   COLON FINDINGS: There was mild diverticulosis noted in the left colon.   A sessile polyp measuring 13 mm in size was found in the rectum.  A polypectomy was performed in a piecemeal fashion using snare cautery.  The resection was complete, the polyp tissue  was completely retrieved and sent to histology.   The examination was otherwise normal.  Retroflexed views revealed no abnormalities. The time to cecum = 3.0 Withdrawal time = 9.0   The scope was withdrawn and the procedure completed. COMPLICATIONS: There were no immediate complications.  ENDOSCOPIC IMPRESSION: 1.   Mild diverticulosis was noted in the left colon 2.   Sessile polyp was found in the rectum; polypectomy was performed in a piecemeal fashion using snare cautery 3.   The examination was otherwise normal  RECOMMENDATIONS: You will receive a letter within 1-2 weeks with the results of your biopsy as well as final recommendations. You will likely need repeat examination (flex sig) in 6-12 months given the piecemeal resected recurrent rectal polyp.  Please call my office if you have not received a letter after 3 weeks.  eSigned:  Milus Banister, MD 2015-04-08 11:24:10.115   cc:   PATIENT NAME:  Tara Ingram, Tara Ingram MR#: [id number]

## 2015-04-08 NOTE — Progress Notes (Signed)
Report to PACU, RN, vss, BBS= Clear.  

## 2015-04-09 ENCOUNTER — Telehealth: Payer: Self-pay

## 2015-04-09 NOTE — Telephone Encounter (Signed)
  Follow up Call-  Call back number 04/08/2015  Post procedure Call Back phone  # [phone]  Permission to leave phone message Yes     Patient questions:  Do you have a fever, pain , or abdominal swelling? No. Pain Score  0 *  Have you tolerated food without any problems? Yes.    Have you been able to return to your normal activities? Yes.    Do you have any questions about your discharge instructions: Diet   No. Medications  No. Follow up visit  No.  Do you have questions or concerns about your Care? No.  Actions: * If pain score is 4 or above: No action needed, pain <4.

## 2015-04-10 DIAGNOSIS — M545 Low back pain: Secondary | ICD-10-CM | POA: Diagnosis not present

## 2015-04-10 DIAGNOSIS — R269 Unspecified abnormalities of gait and mobility: Secondary | ICD-10-CM | POA: Diagnosis not present

## 2015-04-10 DIAGNOSIS — M6281 Muscle weakness (generalized): Secondary | ICD-10-CM | POA: Diagnosis not present

## 2015-04-15 ENCOUNTER — Encounter: Payer: Self-pay | Admitting: Gastroenterology

## 2015-04-16 DIAGNOSIS — G894 Chronic pain syndrome: Secondary | ICD-10-CM | POA: Diagnosis not present

## 2015-04-16 DIAGNOSIS — M8000XS Age-related osteoporosis with current pathological fracture, unspecified site, sequela: Secondary | ICD-10-CM | POA: Diagnosis not present

## 2015-04-16 DIAGNOSIS — M47817 Spondylosis without myelopathy or radiculopathy, lumbosacral region: Secondary | ICD-10-CM | POA: Diagnosis not present

## 2015-04-16 DIAGNOSIS — M545 Low back pain: Secondary | ICD-10-CM | POA: Diagnosis not present

## 2015-04-21 DIAGNOSIS — M6281 Muscle weakness (generalized): Secondary | ICD-10-CM | POA: Diagnosis not present

## 2015-04-21 DIAGNOSIS — R269 Unspecified abnormalities of gait and mobility: Secondary | ICD-10-CM | POA: Diagnosis not present

## 2015-04-21 DIAGNOSIS — M545 Low back pain: Secondary | ICD-10-CM | POA: Diagnosis not present

## 2015-04-22 DIAGNOSIS — E789 Disorder of lipoprotein metabolism, unspecified: Secondary | ICD-10-CM | POA: Diagnosis not present

## 2015-04-22 DIAGNOSIS — M549 Dorsalgia, unspecified: Secondary | ICD-10-CM | POA: Diagnosis not present

## 2015-04-22 DIAGNOSIS — I10 Essential (primary) hypertension: Secondary | ICD-10-CM | POA: Diagnosis not present

## 2015-04-28 DIAGNOSIS — R269 Unspecified abnormalities of gait and mobility: Secondary | ICD-10-CM | POA: Diagnosis not present

## 2015-04-28 DIAGNOSIS — M545 Low back pain: Secondary | ICD-10-CM | POA: Diagnosis not present

## 2015-04-28 DIAGNOSIS — M6281 Muscle weakness (generalized): Secondary | ICD-10-CM | POA: Diagnosis not present

## 2015-05-15 DIAGNOSIS — M6281 Muscle weakness (generalized): Secondary | ICD-10-CM | POA: Diagnosis not present

## 2015-05-15 DIAGNOSIS — R269 Unspecified abnormalities of gait and mobility: Secondary | ICD-10-CM | POA: Diagnosis not present

## 2015-05-15 DIAGNOSIS — M545 Low back pain: Secondary | ICD-10-CM | POA: Diagnosis not present

## 2015-05-19 DIAGNOSIS — M545 Low back pain: Secondary | ICD-10-CM | POA: Diagnosis not present

## 2015-05-19 DIAGNOSIS — M6281 Muscle weakness (generalized): Secondary | ICD-10-CM | POA: Diagnosis not present

## 2015-05-19 DIAGNOSIS — R269 Unspecified abnormalities of gait and mobility: Secondary | ICD-10-CM | POA: Diagnosis not present

## 2015-06-03 ENCOUNTER — Encounter: Payer: Self-pay | Admitting: Pulmonary Disease

## 2015-06-03 ENCOUNTER — Ambulatory Visit (INDEPENDENT_AMBULATORY_CARE_PROVIDER_SITE_OTHER): Payer: Medicare Other | Admitting: Pulmonary Disease

## 2015-06-03 VITALS — BP 118/74 | HR 76 | Ht 63.0 in | Wt 174.6 lb

## 2015-06-03 DIAGNOSIS — G4733 Obstructive sleep apnea (adult) (pediatric): Secondary | ICD-10-CM

## 2015-06-03 NOTE — Patient Instructions (Signed)
Will arrange for new CPAP mask  Will have [place] adjust your pressure setting  Call if you are still having trouble adjusting to using CPAP  Follow up in 6 months

## 2015-06-03 NOTE — Progress Notes (Signed)
Current Outpatient Prescriptions on File Prior to Visit  Medication Sig  . celecoxib (CELEBREX) 200 MG capsule Take 200 mg by mouth daily as needed for mild pain. Reported on 04/08/2015  . cholestyramine (QUESTRAN) 4 G packet Take 1 packet (4 g total) by mouth 2 (two) times daily. MIX 1 PACKET (4 G TOTAL) AND TAKE BY MOUTH ONCE A DAY  . diclofenac sodium (VOLTAREN) 1 % GEL   . diphenhydrAMINE (BENADRYL) 25 MG tablet Take 25 mg by mouth as needed for itching or allergies.   Marland Kitchen HYDROcodone-acetaminophen (NORCO) 10-325 MG per tablet Take 1 tablet by mouth every 6 (six) hours as needed for moderate pain.  Marland Kitchen metoCLOPramide (REGLAN) 5 MG tablet Take 2-3 times daily as needed for nausea.  Tara Ingram (ZEGERID OTC PO) Take 1 tablet by mouth as needed. Reported on 04/08/2015  . tiZANidine (ZANAFLEX) 2 MG tablet Take 1 tablet by mouth at bedtime.  . valsartan-hydrochlorothiazide (DIOVAN-HCT) 320-12.5 MG tablet Take 0.5 tablets by mouth daily. Reported on 03/25/2015   No current facility-administered medications on file prior to visit.     Chief Complaint  Patient presents with  . Follow-up    Wears CPAP nightly. Pt reports having a headache since using CPAP machine - reports daily with nasal congestion. Mask leaking. DME: [facility]     Tests PSG 10/02/14 >> AHI 63.5, SaO2 low 73%, CPAP 13 >> AHI 0 CPAP 05/02/15 to 05/31/15 >> used on 30 of 30 nights with average 4 hrs 15 min.  Average AHI 8 with CPAP 13 cm H2O  Past medical hx HTN, HLD, GERD, Nephrolthiasis  Past surgical hx, Allergies, Family hx, Social hx all reviewed.  Vital Signs BP 118/74 mmHg  Pulse 76  Ht 5\' 3"  (1.6 m)  Wt 174 lb 9.6 oz (79.198 kg)  BMI 30.94 kg/m2  SpO2 98%  History of Present Illness Tara Ingram is a 70 y.o. female with obstructive sleep apnea.  She has trouble getting used to CPAP.  She has nasal pillows mask.  She has been getting sinus headache ever since using CPAP.  She feels the pressure  setting might be too high.  She doesn't feel like CPAP has improved her sleep much so far >> mostly because of the issues with headaches.  She doesn't want a mask that has too many straps that go over her head.  Physical Exam  General - No distress ENT - No sinus tenderness, no oral exudate, no LAN Cardiac - s1s2 regular, no murmur Chest - No wheeze/rales/dullness Back - No focal tenderness Abd - Soft, non-tender Ext - No edema Neuro - Normal strength Skin - No rashes Psych - normal mood, and behavior   Assessment/Plan  Obstructive sleep apnea. - will have her DME adjust her mask fit - will change her CPAP to 12 cm H2O - if she continues to have trouble using CPAP, then might need to change to oral appliance >> explained this isn't as good an option as CPAP given severity of her sleep apnea   Patient Instructions  Will arrange for new CPAP mask  Will have Tara Ingram adjust your pressure setting  Call if you are still having trouble adjusting to using CPAP  Follow up in 6 months     Tara Mires, MD [facility] Pager:  [phone] 06/03/2015, 4:13 PM

## 2015-06-04 DIAGNOSIS — M0579 Rheumatoid arthritis with rheumatoid factor of multiple sites without organ or systems involvement: Secondary | ICD-10-CM | POA: Diagnosis not present

## 2015-06-11 DIAGNOSIS — G894 Chronic pain syndrome: Secondary | ICD-10-CM | POA: Diagnosis not present

## 2015-06-11 DIAGNOSIS — M47817 Spondylosis without myelopathy or radiculopathy, lumbosacral region: Secondary | ICD-10-CM | POA: Diagnosis not present

## 2015-06-11 DIAGNOSIS — M8000XS Age-related osteoporosis with current pathological fracture, unspecified site, sequela: Secondary | ICD-10-CM | POA: Diagnosis not present

## 2015-06-11 DIAGNOSIS — M545 Low back pain: Secondary | ICD-10-CM | POA: Diagnosis not present

## 2015-08-01 DIAGNOSIS — N3281 Overactive bladder: Secondary | ICD-10-CM | POA: Diagnosis not present

## 2015-08-01 DIAGNOSIS — N390 Urinary tract infection, site not specified: Secondary | ICD-10-CM | POA: Diagnosis not present

## 2015-08-01 DIAGNOSIS — M549 Dorsalgia, unspecified: Secondary | ICD-10-CM | POA: Diagnosis not present

## 2015-08-01 DIAGNOSIS — R319 Hematuria, unspecified: Secondary | ICD-10-CM | POA: Diagnosis not present

## 2015-08-07 DIAGNOSIS — M62830 Muscle spasm of back: Secondary | ICD-10-CM | POA: Diagnosis not present

## 2015-08-07 DIAGNOSIS — M47817 Spondylosis without myelopathy or radiculopathy, lumbosacral region: Secondary | ICD-10-CM | POA: Diagnosis not present

## 2015-08-07 DIAGNOSIS — G894 Chronic pain syndrome: Secondary | ICD-10-CM | POA: Diagnosis not present

## 2015-08-07 DIAGNOSIS — Z79891 Long term (current) use of opiate analgesic: Secondary | ICD-10-CM | POA: Diagnosis not present

## 2015-08-07 DIAGNOSIS — M8000XS Age-related osteoporosis with current pathological fracture, unspecified site, sequela: Secondary | ICD-10-CM | POA: Diagnosis not present

## 2015-08-08 DIAGNOSIS — R3915 Urgency of urination: Secondary | ICD-10-CM | POA: Diagnosis not present

## 2015-08-08 DIAGNOSIS — R310 Gross hematuria: Secondary | ICD-10-CM | POA: Diagnosis not present

## 2015-08-15 DIAGNOSIS — R310 Gross hematuria: Secondary | ICD-10-CM | POA: Diagnosis not present

## 2015-08-19 ENCOUNTER — Other Ambulatory Visit: Payer: Self-pay | Admitting: Urology

## 2015-08-25 ENCOUNTER — Encounter: Payer: Self-pay | Admitting: Gastroenterology

## 2015-09-02 DIAGNOSIS — E789 Disorder of lipoprotein metabolism, unspecified: Secondary | ICD-10-CM | POA: Diagnosis not present

## 2015-09-02 DIAGNOSIS — R319 Hematuria, unspecified: Secondary | ICD-10-CM | POA: Diagnosis not present

## 2015-09-02 DIAGNOSIS — R310 Gross hematuria: Secondary | ICD-10-CM | POA: Diagnosis not present

## 2015-09-02 DIAGNOSIS — Z01818 Encounter for other preprocedural examination: Secondary | ICD-10-CM | POA: Diagnosis not present

## 2015-09-02 DIAGNOSIS — I10 Essential (primary) hypertension: Secondary | ICD-10-CM | POA: Diagnosis not present

## 2015-09-04 ENCOUNTER — Other Ambulatory Visit: Payer: Self-pay | Admitting: Endocrinology

## 2015-09-04 DIAGNOSIS — M0579 Rheumatoid arthritis with rheumatoid factor of multiple sites without organ or systems involvement: Secondary | ICD-10-CM | POA: Diagnosis not present

## 2015-09-04 DIAGNOSIS — M1990 Unspecified osteoarthritis, unspecified site: Secondary | ICD-10-CM | POA: Diagnosis not present

## 2015-09-04 DIAGNOSIS — S32000D Wedge compression fracture of unspecified lumbar vertebra, subsequent encounter for fracture with routine healing: Secondary | ICD-10-CM | POA: Diagnosis not present

## 2015-09-04 DIAGNOSIS — M150 Primary generalized (osteo)arthritis: Secondary | ICD-10-CM | POA: Diagnosis not present

## 2015-09-04 DIAGNOSIS — Z79899 Other long term (current) drug therapy: Secondary | ICD-10-CM | POA: Diagnosis not present

## 2015-09-04 DIAGNOSIS — M810 Age-related osteoporosis without current pathological fracture: Secondary | ICD-10-CM | POA: Diagnosis not present

## 2015-09-04 DIAGNOSIS — R911 Solitary pulmonary nodule: Secondary | ICD-10-CM

## 2015-09-05 ENCOUNTER — Other Ambulatory Visit: Payer: Self-pay | Admitting: Gastroenterology

## 2015-09-12 NOTE — Anesthesia Preprocedure Evaluation (Addendum)
Anesthesia Evaluation  Patient identified by MRN, date of birth, ID band Patient awake    Reviewed: Allergy & Precautions, NPO status , Patient's Chart, lab work & pertinent test results  Airway Mallampati: II       Dental  (+) Teeth Intact, Dental Advisory Given   Pulmonary sleep apnea and Continuous Positive Airway Pressure Ventilation ,    breath sounds clear to auscultation       Cardiovascular hypertension, Pt. on medications  Rhythm:Regular     Neuro/Psych negative neurological ROS  negative psych ROS   GI/Hepatic Neg liver ROS, GERD  ,  Endo/Other  negative endocrine ROS  Renal/GU negative Renal ROS  negative genitourinary   Musculoskeletal negative musculoskeletal ROS (+)   Abdominal   Peds negative pediatric ROS (+)  Hematology negative hematology ROS (+)   Anesthesia Other Findings   Reproductive/Obstetrics negative OB ROS                            Anesthesia Physical Anesthesia Plan  ASA: III  Anesthesia Plan: General   Post-op Pain Management:    Induction: Intravenous  Airway Management Planned: LMA and Oral ETT  Additional Equipment:   Intra-op Plan:   Post-operative Plan: Extubation in OR  Informed Consent: I have reviewed the patients History and Physical, chart, labs and discussed the procedure including the risks, benefits and alternatives for the proposed anesthesia with the patient or authorized representative who has indicated his/her understanding and acceptance.     Plan Discussed with:   Anesthesia Plan Comments:         Anesthesia Quick Evaluation

## 2015-09-15 NOTE — Patient Instructions (Addendum)
Tara Ingram  09/15/2015   Your procedure is scheduled on: 09-18-15  Report to [facility]  Entrance take [facility]  elevators to 3rd floor to  [place] at  700 AM.  Call this number if you have problems the morning of surgery [phone]   Remember: ONLY 1 PERSON MAY GO WITH YOU TO SHORT STAY TO GET  READY MORNING OF [facility].  Do not eat food or drink liquids :After Midnight.  Bring cpap mask and tubing  Take these medicines the morning of surgery with A SIP OF WATER: mybetriq, hydrocodone if needed D                              You may not have any metal on your body including hair pins and              piercings  Do not wear jewelry, make-up, lotions, powders or perfumes, deodorant             Do not wear nail polish.  Do not shave  48 hours prior to surgery.              Men may shave face and neck.   Do not bring valuables to the hospital. [name].  Contacts, dentures or bridgework may not be worn into surgery.  Leave suitcase in the car. After surgery it may be brought to your room.                 Please read over the following fact sheets you were given: _____________________________________________________________________             [facility] - Preparing for Surgery Before surgery, you can play an important role.  Because skin is not sterile, your skin needs to be as free of germs as possible.  You can reduce the number of germs on your skin by washing with CHG (chlorahexidine gluconate) soap before surgery.  CHG is an antiseptic cleaner which kills germs and bonds with the skin to continue killing germs even after washing. Please DO NOT use if you have an allergy to CHG or antibacterial soaps.  If your skin becomes reddened/irritated stop using the CHG and inform your nurse when you arrive at Short Stay. Do not shave (including legs and underarms) for at least 48 hours prior to  the first CHG shower.  You may shave your face/neck. Please follow these instructions carefully:  1.  Shower with CHG Soap the night before surgery and the  morning of Surgery.  2.  If you choose to wash your hair, wash your hair first as usual with your  normal  shampoo.  3.  After you shampoo, rinse your hair and body thoroughly to remove the  shampoo.                           4.  Use CHG as you would any other liquid soap.  You can apply chg directly  to the skin and wash                       Gently with a scrungie or clean washcloth.  5.  Apply the CHG Soap to your body ONLY FROM THE NECK DOWN.   Do not use on face/ open                           Wound or open sores. Avoid contact with eyes, ears mouth and genitals (private parts).                       Wash face,  Genitals (private parts) with your normal soap.             6.  Wash thoroughly, paying special attention to the area where your surgery  will be performed.  7.  Thoroughly rinse your body with warm water from the neck down.  8.  DO NOT shower/wash with your normal soap after using and rinsing off  the CHG Soap.                9.  Pat yourself dry with a clean towel.            10.  Wear clean pajamas.            11.  Place clean sheets on your bed the night of your first shower and do not  sleep with pets. Day of Surgery : Do not apply any lotions/deodorants the morning of surgery.  Please wear clean clothes to the hospital/surgery center.  FAILURE TO FOLLOW THESE INSTRUCTIONS MAY RESULT IN THE CANCELLATION OF YOUR SURGERY PATIENT SIGNATURE_________________________________  NURSE SIGNATURE__________________________________  ________________________________________________________________________

## 2015-09-16 ENCOUNTER — Encounter (HOSPITAL_COMMUNITY): Payer: Self-pay

## 2015-09-16 ENCOUNTER — Encounter (HOSPITAL_COMMUNITY)
Admission: RE | Admit: 2015-09-16 | Discharge: 2015-09-16 | Disposition: A | Discharge status: Home / Self Care | Payer: Medicare Other | Source: Ambulatory Visit | Attending: Urology | Admitting: Urology

## 2015-09-16 HISTORY — DX: Headache: R51

## 2015-09-16 HISTORY — DX: Headache, unspecified: R51.9

## 2015-09-16 LAB — CBC
HCT: 42.9 % (ref 36.0–46.0)
Hemoglobin: 13.9 g/dL (ref 12.0–15.0)
MCH: 30.7 pg (ref 26.0–34.0)
MCHC: 32.4 g/dL (ref 30.0–36.0)
MCV: 94.7 fL (ref 78.0–100.0)
PLATELETS: 212 10*3/uL (ref 150–400)
RBC: 4.53 MIL/uL (ref 3.87–5.11)
RDW: 13.9 % (ref 11.5–15.5)
WBC: 6.8 10*3/uL (ref 4.0–10.5)

## 2015-09-16 NOTE — Progress Notes (Addendum)
ekg 09-02-15 dr Wilson Singer on chart Chest xray 09-02-15 dr Wilson Singer on chart cmet, thyroid panel with tsh 09-02-15 on cart dr Wilson Singer Surgery clearance note on chart dr Wilson Singer

## 2015-09-17 ENCOUNTER — Ambulatory Visit
Admission: RE | Admit: 2015-09-17 | Discharge: 2015-09-17 | Disposition: A | Discharge status: Home / Self Care | Payer: Medicare Other | Source: Ambulatory Visit | Attending: Endocrinology | Admitting: Endocrinology

## 2015-09-17 DIAGNOSIS — R911 Solitary pulmonary nodule: Secondary | ICD-10-CM | POA: Diagnosis not present

## 2015-09-17 LAB — URINE CULTURE: CULTURE: NO GROWTH

## 2015-09-17 MED ORDER — IOPAMIDOL (ISOVUE-300) INJECTION 61%
75.0000 mL | Freq: Once | INTRAVENOUS | Status: AC | PRN
  Administered 2015-09-17: 75 mL via INTRAVENOUS

## 2015-09-18 ENCOUNTER — Inpatient Hospital Stay (HOSPITAL_COMMUNITY)
Admission: RE | Admit: 2015-09-18 | Discharge: 2015-09-20 | DRG: 670 | Disposition: A | Discharge status: Home / Self Care | Payer: Medicare Other | Source: Ambulatory Visit | Attending: Urology | Admitting: Urology

## 2015-09-18 ENCOUNTER — Encounter (HOSPITAL_COMMUNITY): Payer: Self-pay

## 2015-09-18 ENCOUNTER — Encounter (HOSPITAL_COMMUNITY)
Admission: RE | Disposition: A | Discharge status: Home / Self Care | Payer: Self-pay | Source: Ambulatory Visit | Attending: Urology

## 2015-09-18 ENCOUNTER — Ambulatory Visit (HOSPITAL_COMMUNITY): Payer: Medicare Other | Admitting: Anesthesiology

## 2015-09-18 DIAGNOSIS — C670 Malignant neoplasm of trigone of bladder: Principal | ICD-10-CM | POA: Diagnosis present

## 2015-09-18 DIAGNOSIS — M1990 Unspecified osteoarthritis, unspecified site: Secondary | ICD-10-CM | POA: Diagnosis not present

## 2015-09-18 DIAGNOSIS — K219 Gastro-esophageal reflux disease without esophagitis: Secondary | ICD-10-CM | POA: Diagnosis not present

## 2015-09-18 DIAGNOSIS — C678 Malignant neoplasm of overlapping sites of bladder: Secondary | ICD-10-CM | POA: Diagnosis not present

## 2015-09-18 DIAGNOSIS — E7800 Pure hypercholesterolemia, unspecified: Secondary | ICD-10-CM | POA: Diagnosis present

## 2015-09-18 DIAGNOSIS — G4730 Sleep apnea, unspecified: Secondary | ICD-10-CM | POA: Diagnosis not present

## 2015-09-18 DIAGNOSIS — R310 Gross hematuria: Secondary | ICD-10-CM | POA: Diagnosis not present

## 2015-09-18 DIAGNOSIS — R3915 Urgency of urination: Secondary | ICD-10-CM | POA: Diagnosis present

## 2015-09-18 DIAGNOSIS — I10 Essential (primary) hypertension: Secondary | ICD-10-CM | POA: Diagnosis present

## 2015-09-18 DIAGNOSIS — D494 Neoplasm of unspecified behavior of bladder: Secondary | ICD-10-CM | POA: Diagnosis not present

## 2015-09-18 DIAGNOSIS — C679 Malignant neoplasm of bladder, unspecified: Secondary | ICD-10-CM | POA: Diagnosis not present

## 2015-09-18 HISTORY — PX: TRANSURETHRAL RESECTION OF BLADDER TUMOR: SHX2575

## 2015-09-18 HISTORY — PX: CYSTOSCOPY W/ RETROGRADES: SHX1426

## 2015-09-18 LAB — BASIC METABOLIC PANEL
ANION GAP: 5 (ref 5–15)
BUN: 15 mg/dL (ref 6–20)
CO2: 23 mmol/L (ref 22–32)
Calcium: 8 mg/dL — ABNORMAL LOW (ref 8.9–10.3)
Chloride: 109 mmol/L (ref 101–111)
Creatinine, Ser: 0.84 mg/dL (ref 0.44–1.00)
GFR calc Af Amer: >60 mL/min (ref >60)
Glucose, Bld: 108 mg/dL — ABNORMAL HIGH (ref 65–99)
POTASSIUM: 3.5 mmol/L (ref 3.5–5.1)
SODIUM: 137 mmol/L (ref 135–145)

## 2015-09-18 LAB — HEMOGLOBIN AND HEMATOCRIT, BLOOD
HCT: 36.3 % (ref 36.0–46.0)
HEMOGLOBIN: 11.9 g/dL — AB (ref 12.0–15.0)

## 2015-09-18 SURGERY — TURBT (TRANSURETHRAL RESECTION OF BLADDER TUMOR)
Anesthesia: General

## 2015-09-18 MED ORDER — LIDOCAINE HCL (CARDIAC) 20 MG/ML IV SOLN
INTRAVENOUS | Status: DC | PRN
  Administered 2015-09-18: 100 mg via INTRAVENOUS

## 2015-09-18 MED ORDER — HYDROCODONE-ACETAMINOPHEN 10-325 MG PO TABS
1.0000 | ORAL_TABLET | Freq: Four times a day (QID) | ORAL | Status: DC | PRN
  Administered 2015-09-18 – 2015-09-20 (×5): 1 via ORAL
  Filled 2015-09-18 (×5): qty 1

## 2015-09-18 MED ORDER — EPHEDRINE SULFATE 50 MG/ML IJ SOLN
INTRAMUSCULAR | Status: AC
  Filled 2015-09-18: qty 1

## 2015-09-18 MED ORDER — BELLADONNA ALKALOIDS-OPIUM 16.2-60 MG RE SUPP
1.0000 | Freq: Four times a day (QID) | RECTAL | Status: DC | PRN
  Administered 2015-09-18: 1 via RECTAL
  Filled 2015-09-18: qty 1

## 2015-09-18 MED ORDER — HYDROMORPHONE HCL 1 MG/ML IJ SOLN
0.5000 mg | INTRAMUSCULAR | Status: DC | PRN
  Administered 2015-09-18: 0.5 mg via INTRAVENOUS
  Filled 2015-09-18: qty 1

## 2015-09-18 MED ORDER — FENTANYL CITRATE (PF) 100 MCG/2ML IJ SOLN
25.0000 ug | INTRAMUSCULAR | Status: DC | PRN
  Administered 2015-09-18 (×2): 50 ug via INTRAVENOUS

## 2015-09-18 MED ORDER — FENTANYL CITRATE (PF) 100 MCG/2ML IJ SOLN
INTRAMUSCULAR | Status: AC
  Filled 2015-09-18: qty 2

## 2015-09-18 MED ORDER — SODIUM CHLORIDE 0.9 % IV SOLN
INTRAVENOUS | Status: DC | PRN
  Administered 2015-09-18: 25 ug/min via INTRAVENOUS

## 2015-09-18 MED ORDER — PROMETHAZINE HCL 25 MG/ML IJ SOLN: 6.2500 mg | INTRAMUSCULAR | Status: DC | PRN

## 2015-09-18 MED ORDER — MEPERIDINE HCL 50 MG/ML IJ SOLN: 6.2500 mg | INTRAMUSCULAR | Status: DC | PRN

## 2015-09-18 MED ORDER — PHENYLEPHRINE HCL 10 MG/ML IJ SOLN
INTRAMUSCULAR | Status: AC
  Filled 2015-09-18: qty 1

## 2015-09-18 MED ORDER — SODIUM CHLORIDE 0.9 % IR SOLN
Status: DC | PRN
  Administered 2015-09-18: 45000 mL via INTRAVESICAL

## 2015-09-18 MED ORDER — EPHEDRINE SULFATE 50 MG/ML IJ SOLN
INTRAMUSCULAR | Status: DC | PRN
  Administered 2015-09-18: 5 mg via INTRAVENOUS
  Administered 2015-09-18: 10 mg via INTRAVENOUS
  Administered 2015-09-18: 5 mg via INTRAVENOUS

## 2015-09-18 MED ORDER — ONDANSETRON HCL 4 MG/2ML IJ SOLN
INTRAMUSCULAR | Status: AC
  Filled 2015-09-18: qty 2

## 2015-09-18 MED ORDER — METOCLOPRAMIDE HCL 5 MG PO TABS: 5.0000 mg | ORAL_TABLET | Freq: Three times a day (TID) | ORAL | Status: DC | PRN

## 2015-09-18 MED ORDER — SODIUM CHLORIDE 0.9 % IV SOLN
INTRAVENOUS | Status: DC | PRN
  Administered 2015-09-18: 10:00:00

## 2015-09-18 MED ORDER — MIRABEGRON ER 50 MG PO TB24
50.0000 mg | ORAL_TABLET | Freq: Every day | ORAL | Status: DC
  Administered 2015-09-19: 50 mg via ORAL
  Filled 2015-09-18 (×2): qty 1

## 2015-09-18 MED ORDER — IRBESARTAN 300 MG PO TABS
300.0000 mg | ORAL_TABLET | Freq: Every day | ORAL | Status: DC
  Administered 2015-09-18 – 2015-09-19 (×2): 300 mg via ORAL
  Filled 2015-09-18 (×2): qty 1
  Filled 2015-09-18: qty 2
  Filled 2015-09-18: qty 1

## 2015-09-18 MED ORDER — HYDROMORPHONE HCL 1 MG/ML IJ SOLN
0.5000 mg | Freq: Once | INTRAMUSCULAR | Status: AC
  Administered 2015-09-18: 0.5 mg via INTRAVENOUS

## 2015-09-18 MED ORDER — SUCCINYLCHOLINE CHLORIDE 20 MG/ML IJ SOLN
INTRAMUSCULAR | Status: DC | PRN
  Administered 2015-09-18: 100 mg via INTRAVENOUS

## 2015-09-18 MED ORDER — SODIUM CHLORIDE 0.9 % IR SOLN
3000.0000 mL | Status: DC
  Administered 2015-09-18: 3000 mL

## 2015-09-18 MED ORDER — ROCURONIUM BROMIDE 100 MG/10ML IV SOLN
INTRAVENOUS | Status: DC | PRN
  Administered 2015-09-18: 40 mg via INTRAVENOUS

## 2015-09-18 MED ORDER — SODIUM CHLORIDE 0.9 % IJ SOLN
INTRAMUSCULAR | Status: AC
  Filled 2015-09-18: qty 10

## 2015-09-18 MED ORDER — PROPOFOL 10 MG/ML IV BOLUS
INTRAVENOUS | Status: AC
  Filled 2015-09-18: qty 20

## 2015-09-18 MED ORDER — LACTATED RINGERS IV SOLN
INTRAVENOUS | Status: DC
  Administered 2015-09-18: 09:00:00 via INTRAVENOUS

## 2015-09-18 MED ORDER — LEFLUNOMIDE 20 MG PO TABS
20.0000 mg | ORAL_TABLET | Freq: Every day | ORAL | Status: DC
  Administered 2015-09-18 – 2015-09-19 (×2): 20 mg via ORAL
  Filled 2015-09-18 (×3): qty 1

## 2015-09-18 MED ORDER — LIDOCAINE HCL (CARDIAC) 20 MG/ML IV SOLN
INTRAVENOUS | Status: AC
Start: 2015-09-18 — End: 2015-09-18
  Filled 2015-09-18: qty 5

## 2015-09-18 MED ORDER — PROPOFOL 10 MG/ML IV BOLUS
INTRAVENOUS | Status: DC | PRN
  Administered 2015-09-18: 50 mg via INTRAVENOUS
  Administered 2015-09-18: 150 mg via INTRAVENOUS

## 2015-09-18 MED ORDER — HYDROMORPHONE HCL 1 MG/ML IJ SOLN
INTRAMUSCULAR | Status: AC
  Filled 2015-09-18: qty 1

## 2015-09-18 MED ORDER — CIPROFLOXACIN IN D5W 400 MG/200ML IV SOLN
400.0000 mg | INTRAVENOUS | Status: AC
  Administered 2015-09-18: 400 mg via INTRAVENOUS

## 2015-09-18 MED ORDER — LACTATED RINGERS IV SOLN
INTRAVENOUS | Status: DC
  Administered 2015-09-18 – 2015-09-19 (×4): via INTRAVENOUS

## 2015-09-18 MED ORDER — SUGAMMADEX SODIUM 200 MG/2ML IV SOLN
INTRAVENOUS | Status: AC
  Filled 2015-09-18: qty 2

## 2015-09-18 MED ORDER — TIZANIDINE HCL 2 MG PO TABS
2.0000 mg | ORAL_TABLET | Freq: Every day | ORAL | Status: DC
  Administered 2015-09-18 – 2015-09-19 (×2): 2 mg via ORAL
  Filled 2015-09-18 (×2): qty 1

## 2015-09-18 MED ORDER — SUGAMMADEX SODIUM 200 MG/2ML IV SOLN
INTRAVENOUS | Status: DC | PRN
  Administered 2015-09-18: 200 mg via INTRAVENOUS

## 2015-09-18 MED ORDER — BELLADONNA ALKALOIDS-OPIUM 16.2-60 MG RE SUPP
RECTAL | Status: AC
  Filled 2015-09-18: qty 1

## 2015-09-18 MED ORDER — FENTANYL CITRATE (PF) 100 MCG/2ML IJ SOLN
INTRAMUSCULAR | Status: DC | PRN
  Administered 2015-09-18 (×4): 25 ug via INTRAVENOUS

## 2015-09-18 MED ORDER — STERILE WATER FOR IRRIGATION IR SOLN
Status: DC | PRN
  Administered 2015-09-18: 1000 mL

## 2015-09-18 MED ORDER — ONDANSETRON HCL 4 MG/2ML IJ SOLN
INTRAMUSCULAR | Status: DC | PRN
  Administered 2015-09-18: 4 mg via INTRAVENOUS

## 2015-09-18 MED ORDER — CIPROFLOXACIN IN D5W 400 MG/200ML IV SOLN
INTRAVENOUS | Status: AC
  Filled 2015-09-18: qty 200

## 2015-09-18 MED ORDER — BELLADONNA ALKALOIDS-OPIUM 16.2-60 MG RE SUPP
RECTAL | Status: DC | PRN
  Administered 2015-09-18: 1 via RECTAL

## 2015-09-18 SURGICAL SUPPLY — 32 items
BAG URINE DRAINAGE (UROLOGICAL SUPPLIES) IMPLANT
BAG URO CATCHER STRL LF (MISCELLANEOUS) ×3 IMPLANT
BASKET DAKOTA 1.9FR 11X120 (BASKET) IMPLANT
BASKET ZERO TIP NITINOL 2.4FR (BASKET) IMPLANT
BSKT STON RTRVL ZERO TP 2.4FR (BASKET)
CATH FOLEY 2WAY SLVR 30CC 24FR (CATHETERS) IMPLANT
CATH FOLEY 3WAY 30CC 24FR (CATHETERS) ×3
CATH URET 5FR 28IN OPEN ENDED (CATHETERS) ×3 IMPLANT
CATH URTH STD 24FR FL 3W 2 (CATHETERS) IMPLANT
CLOTH BEACON ORANGE TIMEOUT ST (SAFETY) ×3 IMPLANT
ELECT REM PT RETURN 9FT ADLT (ELECTROSURGICAL) ×3
ELECTRODE REM PT RTRN 9FT ADLT (ELECTROSURGICAL) ×2 IMPLANT
EVACUATOR MICROVAS BLADDER (UROLOGICAL SUPPLIES) IMPLANT
GLOVE BIOGEL M 8.0 STRL (GLOVE) ×3 IMPLANT
GLOVE BIOGEL M STRL SZ7.5 (GLOVE) ×3 IMPLANT
GOWN STRL REUS W/ TWL XL LVL3 (GOWN DISPOSABLE) ×2 IMPLANT
GOWN STRL REUS W/TWL XL LVL3 (GOWN DISPOSABLE) ×6 IMPLANT
GUIDEWIRE ANG ZIPWIRE 038X150 (WIRE) IMPLANT
GUIDEWIRE STR DUAL SENSOR (WIRE) ×3 IMPLANT
LOOP CUT BIPOLAR 24F LRG (ELECTROSURGICAL) ×1 IMPLANT
MANIFOLD NEPTUNE II (INSTRUMENTS) ×3 IMPLANT
NDL SAFETY ECLIPSE 18X1.5 (NEEDLE) ×2 IMPLANT
NEEDLE HYPO 18GX1.5 SHARP (NEEDLE) ×3
PACK CYSTO (CUSTOM PROCEDURE TRAY) ×3 IMPLANT
SET ASPIRATION TUBING (TUBING) IMPLANT
SHEATH ACCESS URETERAL 24CM (SHEATH) IMPLANT
SHEATH ACCESS URETERAL 38CM (SHEATH) IMPLANT
SHEATH ACCESS URETERAL 54CM (SHEATH) IMPLANT
SYR 30ML LL (SYRINGE) ×1 IMPLANT
SYRINGE IRR TOOMEY STRL 70CC (SYRINGE) ×1 IMPLANT
TUBING CONNECTING 10 (TUBING) ×3 IMPLANT
WATER STERILE IRR 3000ML UROMA (IV SOLUTION) ×3 IMPLANT

## 2015-09-18 NOTE — Op Note (Signed)
Preoperative diagnosis:  1. Bladder tumor   Postoperative diagnosis:  1. Bladder tumor greater than 5 cm the posterior bladder wall 2. Numerous smaller satellite bladder lesions in overlapping sites   Procedure: 1. Cystoscopy, left retrograde pyelogram with interpretation 2. Transurethral resection of bladder tumor, greater than 5 cm  Surgeon: Ardis Hughs, MD  Anesthesia: General  Complications: None  Intraoperative findings:  #1: The patient's left retrograde pyelogram demonstrated a normal caliber ureter with no filling defects or hydronephrosis. #2  The patient had extensive tumor burden predominantly on the posterior floor in the left lateral wall.  After resecting for 2.5 hours a decision was made to stage this resection.  The tumor was incompletely resected with a significant amount of tumor left along the left side of the posterior bladder wall and the left lateral wall.  EBL: Minimal  Specimens: None  Indication: Tara Ingram is a 70 y.o. patient with history of urinary incontinence and dysuria.  She had gross hematuria over the past year.  She was treated for urinary urgency and infections over the course of that year and ultimately a CT scan was done in demonstrated a large bladder tumor..  After reviewing the management options for treatment, he elected to proceed with the above surgical procedure(s). We have discussed the potential benefits and risks of the procedure, side effects of the proposed treatment, the likelihood of the patient achieving the goals of the procedure, and any potential problems that might occur during the procedure or recuperation. Informed consent has been obtained.  Description of procedure:  The patient was taken to the operating room and general anesthesia was induced.  The patient was placed in the dorsal lithotomy position, prepped and draped in the usual sterile fashion, and preoperative antibiotics were administered. A preoperative  time-out was performed.   A 30 21 French cystoscope was gently passed into the patient's urethra and into the bladder.  Immediately it was clear the patient had a very large tumor burden.  Fortunately, I was able to locate the left ureteral orifice and performed a retrograde pyelogram using 10 cc of Omnipaque contrast.  The above findings were then noted.  I was unable to identify the patient's right ureteral orifice.  I then exchanged the 21 French cystoscope for the 26 French resectoscope using the visual obturator.  I then resected the patient's right lateral wall and posterior floor for approximately 2.5 hours.  There was still significant tumor burden in the patient's left lateral wall and left posterior wall.  However, given the amount of resection and worsening visualization due to bleeding, I opted to terminate the case and states the procedure.  The bladder was then hand irrigated with a Toomey syringe using sterile water.  I then inserted a 24 French three-way Foley catheter and place the patient on continuous bladder irrigation.  A B and O suppository was placed in the patient's rectum.  The patient was subsequently awoken and taken to the PACU in stable condition.   Ardis Hughs, M.D.

## 2015-09-18 NOTE — Progress Notes (Signed)
Hgb. And Hct. And B met drawn by lab.

## 2015-09-18 NOTE — Anesthesia Postprocedure Evaluation (Signed)
Anesthesia Post Note  Patient: Tara Ingram [facility]  Procedure(s) Performed: Procedure(s) (LRB): TRANSURETHRAL RESECTION OF BLADDER TUMOR (TURBT) (N/A) BILATERAL RETROGRADE PYELOGRAM (Bilateral)  Patient location during evaluation: PACU Anesthesia Type: General Level of consciousness: awake and alert Pain management: pain level controlled Vital Signs Assessment: post-procedure vital signs reviewed and stable Respiratory status: spontaneous breathing, nonlabored ventilation, respiratory function stable and patient connected to nasal cannula oxygen Cardiovascular status: blood pressure returned to baseline and stable Postop Assessment: no signs of nausea or vomiting Anesthetic complications: no    Last Vitals:  Vitals:   09/18/15 1253 09/18/15 1300  BP:  (!) 107/52  Pulse: 87 80  Resp: 13 13  Temp:  36.4 C    Last Pain:  Vitals:   09/18/15 1253  TempSrc:   PainSc: [place]

## 2015-09-18 NOTE — Anesthesia Procedure Notes (Signed)
Procedure Name: Intubation Date/Time: 09/18/2015 9:11 AM Performed by: Maxwell Caul Pre-anesthesia Checklist: Patient identified, Emergency Drugs available, Suction available and Patient being monitored Patient Re-evaluated:Patient Re-evaluated prior to inductionOxygen Delivery Method: Circle system utilized Preoxygenation: Pre-oxygenation with 100% oxygen Intubation Type: IV induction Ventilation: Mask ventilation without difficulty Laryngoscope Size: Mac and 4 Grade View: Grade I Tube type: Oral Tube size: 7.0 mm Number of attempts: 1 Airway Equipment and Method: Stylet Placement Confirmation: ETT inserted through vocal cords under direct vision,  positive ETCO2 and breath sounds checked- equal and bilateral Secured at: 21 cm Tube secured with: Tape Dental Injury: Teeth and Oropharynx as per pre-operative assessment

## 2015-09-18 NOTE — Progress Notes (Signed)
Lab results noted. 

## 2015-09-18 NOTE — H&P (Signed)
Tara Ingram is a 70 year-old female patient who was referred by Dr. Arleta Creek, MD who is here for blood in the urine.  She first noticed the symptoms approximately 08/02/2014. She did see the blood in her urine. She has not seen blood clots.   She does not have a burning sensation when she urinates. She is not currently having trouble urinating.   She has had kidney stones. She is not having pain. She has not recently had unwanted weight loss.   Patient has been having blood in urine off and on for a couple of years. She noticed this occurs when she takes certain medications such as BC Powder for headaches and celebrex.     CC: I have urinary urgency.  HPI: She does have urgency. She does have problems getting to the bathroom in time after she has the urge to urinate. The condition started approximately 07/03/2015. Her symptoms have gotten worse over the last year.   She does wear protective pads. She wears 2-3 pads per day. She urinates every hour in the daytime. She gets up at night to urinate 3 times. She is having problems with emptying her bladder well.   The patient's urgency symptoms have been present for approximately one month. She denies any fevers or chills. She has not been treated with antibiotic. She denies any flank pain.     ALLERGIES: None   MEDICATIONS: Myrbetriq 50 mg tablet, extended release 24 hr  Cholestyramine 4 gram powder  Leflunomide 10 mg tablet  Tizanidine Hcl 2 mg tablet  Valsartan-Hydrochlorothiazide 320 mg-25 mg tablet     GU PSH: Hysterectomy    NON-GU PSH: Eye Surgery Procedure, Corneal transplants-both eyes Lumbar Laminectomy    GU PMH: None   NON-GU PMH: Essential (primary) hypertension Pure hypercholesterolemia, unspecified Sleep apnea, unspecified Unspecified osteoarthritis, unspecified site    FAMILY HISTORY: Diabetes - Sister Heart Attack - Father   SOCIAL HISTORY: Marital Status: Married Current Smoking Status: Patient has  never smoked.  Has never drank.  Drinks 4+ caffeinated drinks per day. Patient's occupation is/was Retired.    REVIEW OF SYSTEMS:    GU Review Female:   Patient reports frequent urination, hard to postpone urination, get up at night to urinate, and leakage of urine. Patient denies burning /pain with urination, stream starts and stops, trouble starting your stream, have to strain to urinate, and currently pregnant.  Gastrointestinal (Upper):   Patient reports indigestion/ heartburn. Patient denies nausea and vomiting.  Gastrointestinal (Lower):   Patient denies diarrhea and constipation.  Constitutional:   Patient denies fever, night sweats, weight loss, and fatigue.  Skin:   Patient denies skin rash/ lesion and itching.  Eyes:   Patient denies blurred vision and double vision.  Ears/ Nose/ Throat:   Patient reports sore throat and sinus problems.   Hematologic/Lymphatic:   Patient denies easy bruising and swollen glands.  Cardiovascular:   Patient denies leg swelling and chest pains.  Respiratory:   Patient denies cough and shortness of breath.  Endocrine:   Patient denies excessive thirst.  Musculoskeletal:   Patient reports back pain and joint pain.   Neurological:   Patient reports headaches. Patient denies dizziness.  Psychologic:   Patient denies depression and anxiety.   VITAL SIGNS:      08/08/2015 03:14 PM  Weight 170 lb / 77.11 kg  Height 64 in / 162.56 cm  BP 114/64 mmHg  Pulse 69 /min  Temperature 97.9 F / 37 C  BMI 29.2 kg/m   MULTI-SYSTEM PHYSICAL EXAMINATION:    Constitutional: Well-nourished. No physical deformities. Normally developed. Good grooming.  Neck: Neck symmetrical, not swollen. Normal tracheal position.  Respiratory: No labored breathing, no use of accessory muscles.   Cardiovascular: Normal temperature, normal extremity pulses, no swelling, no varicosities.  Lymphatic: No enlargement of neck, axillae, groin.  Skin: No paleness, no jaundice, no  cyanosis. No lesion, no ulcer, no rash.  Neurologic / Psychiatric: Oriented to time, oriented to place, oriented to person. No depression, no anxiety, no agitation.  Gastrointestinal: No mass, no tenderness, no rigidity, non obese abdomen.  Eyes: Normal conjunctivae. Normal eyelids.  Ears, Nose, Mouth, and Throat: Left ear no scars, no lesions, no masses. Right ear no scars, no lesions, no masses. Nose no scars, no lesions, no masses. Normal hearing. Normal lips.  Musculoskeletal: Normal gait and station of head and neck.     PAST DATA REVIEWED:  Source Of History:  Patient  Records Review:   Previous Doctor Records, Previous Patient Records  Urine Test Review:   Urinalysis   PROCEDURES:           PVR Ultrasound - [id number]  Scanned Volume: 188 cc         Urinalysis w/Scope - 81001 Dipstick Dipstick Cont'd Micro  Specimen: Voided Bilirubin: Neg WBC/hpf: 20-40/hpf  Color: Straw Ketones: Neg RBC/hpf: 40-60/hpf  Appearance: Cloudy Blood: 3+ Bacteria: Many (>50/hpf)  Specific Gravity: 1.015 Protein: 1+ Cystals: NS (Not Seen)  pH: 6.0 Urobilinogen: 0.2 Casts: NS (Not Seen)  Glucose: Neg Nitrites: Neg Trichomonas: Not Present    Leukocyte Esterase: 1+ Mucous: Not Present      Epithelial Cells: NS (Not Seen)      Yeast: NS (Not Seen)      Sperm: Not Present    ASSESSMENT:      ICD-10 Details  1 GU:   Gross hematuria - R31.0   2   Urgency of urination - R39.15    PLAN:           Orders Labs BMP          Schedule Labs: 2 Weeks - Urinalysis  X-Rays: 2 Weeks - C.T. Abdomen/Pelvis With and Without I.V. Contrast  Procedure: Unspecified Date at [facility] [phone] - Flexible Cystoscopy (Cystoscopy) - 52000          Document Letter(s):  Created for Patient: Clinical Summary         Notes:   The patient has gross hematuria and progressive voiding symptoms. Her symptoms have improved with myrbetriq 50 mg daily. However, I'm concerned that she may have  some bladder pathology that needs to be further evaluated. As such, I have gone over the gross hematuria evaluation with the patient recommended that she undergo a CT scan, hematuria protocol as well as cystoscopy. The patient like that cystoscopy performed on her return visit. We will obtain labs today so that We can evaluate her renal function prior to giving her IV contrast.    Addendum: CT scan demonstrates a large bladder tumor protruding form the floor of the bladder.  I discussed with the patient the findings and recommended that we proceed to the OR for resection.  I  Discussed the risk/benefits of this operation and the potential for further surgery following the initial resection.  We will get surgical clearance and schedule her ASAP.

## 2015-09-18 NOTE — Transfer of Care (Signed)
Immediate Anesthesia Transfer of Care Note  Patient: Tara Ingram [facility]  Procedure(s) Performed: Procedure(s): TRANSURETHRAL RESECTION OF BLADDER TUMOR (TURBT) (N/A) BILATERAL RETROGRADE PYELOGRAM (Bilateral)  Patient Location: PACU  Anesthesia Type:General  Level of Consciousness:  sedated, patient cooperative and responds to stimulation  Airway & Oxygen Therapy:Patient Spontanous Breathing and Patient connected to face mask oxgen  Post-op Assessment:  Report given to PACU RN and Post -op Vital signs reviewed and stable  Post vital signs:  Reviewed and stable  Last Vitals:  Vitals:   09/18/15 0725  BP: 112/63  Pulse: 86  Resp: 18  Temp: 0000000 C    Complications: No apparent anesthesia complications

## 2015-09-19 DIAGNOSIS — K219 Gastro-esophageal reflux disease without esophagitis: Secondary | ICD-10-CM | POA: Diagnosis present

## 2015-09-19 DIAGNOSIS — G4730 Sleep apnea, unspecified: Secondary | ICD-10-CM | POA: Diagnosis present

## 2015-09-19 DIAGNOSIS — R319 Hematuria, unspecified: Secondary | ICD-10-CM | POA: Diagnosis not present

## 2015-09-19 DIAGNOSIS — E7800 Pure hypercholesterolemia, unspecified: Secondary | ICD-10-CM | POA: Diagnosis present

## 2015-09-19 DIAGNOSIS — R310 Gross hematuria: Secondary | ICD-10-CM | POA: Diagnosis present

## 2015-09-19 DIAGNOSIS — I10 Essential (primary) hypertension: Secondary | ICD-10-CM | POA: Diagnosis present

## 2015-09-19 DIAGNOSIS — R3915 Urgency of urination: Secondary | ICD-10-CM | POA: Diagnosis present

## 2015-09-19 DIAGNOSIS — M1990 Unspecified osteoarthritis, unspecified site: Secondary | ICD-10-CM | POA: Diagnosis present

## 2015-09-19 DIAGNOSIS — C670 Malignant neoplasm of trigone of bladder: Secondary | ICD-10-CM | POA: Diagnosis present

## 2015-09-19 LAB — BASIC METABOLIC PANEL
ANION GAP: 5 (ref 5–15)
BUN: 13 mg/dL (ref 6–20)
CHLORIDE: 110 mmol/L (ref 101–111)
CO2: 26 mmol/L (ref 22–32)
Calcium: 8.5 mg/dL — ABNORMAL LOW (ref 8.9–10.3)
Creatinine, Ser: 0.83 mg/dL (ref 0.44–1.00)
GFR calc non Af Amer: >60 mL/min (ref >60)
GLUCOSE: 112 mg/dL — AB (ref 65–99)
POTASSIUM: 4.2 mmol/L (ref 3.5–5.1)
Sodium: 141 mmol/L (ref 135–145)

## 2015-09-19 LAB — HEMOGLOBIN AND HEMATOCRIT, BLOOD
HEMATOCRIT: 35.1 % — AB (ref 36.0–46.0)
Hemoglobin: 11.3 g/dL — ABNORMAL LOW (ref 12.0–15.0)

## 2015-09-19 MED ORDER — URIBEL 118 MG PO CAPS: 1.0000 | ORAL_CAPSULE | Freq: Four times a day (QID) | ORAL | 1 refills | Status: DC | PRN

## 2015-09-19 NOTE — Progress Notes (Signed)
Pt states that she will self administer CPAP when ready for bed.  Pt to notify RT if any assistance is required throughout the night.  RT to monitor and assess as needed.  

## 2015-09-19 NOTE — Discharge Summary (Signed)
Date of admission: 09/18/2015  Date of discharge: 09/19/2015  Admission diagnosis: bladder cancer  Discharge diagnosis: bladder cancer  Secondary diagnoses: none  History and Physical: For full details, please see admission history and physical. Briefly, SHARLISA Ingram is a 70 y.o. year old patient with bladder cancer.   Hospital Course: 70 yo female who is s/p TURBT with Dr. Louis Meckel on 09/18/15. She did well post-operatively. She was on CBI initially which was weaned. Her diet was slowly advanced and at the time of discharge she was tolerating a regular diet, ambulating at her baseline, was voiding spontaneously after foley catheter removal on POD#2, and pain was well controlled with oral narcotics. She was discharged to home on POD#2.  Laboratory values:  Recent Labs  09/16/15 1500 09/18/15 1219 09/19/15 0530  HGB 13.9 11.9* 11.3*  HCT 42.9 36.3 35.1*    Recent Labs  09/18/15 1219 09/19/15 0530  CREATININE 0.84 0.83    Disposition: Home  Discharge instruction: The patient was instructed to be ambulatory but told to refrain from heavy lifting, strenuous activity, or driving.  Discharge medications:    Medication List    TAKE these medications   cholestyramine 4 g packet Commonly known as:  QUESTRAN MIX 1 PACKET (4 GM TOTAL) AND TAKE BY MOUTH TWICE DAILY. What changed:  See the new instructions.   diclofenac sodium 1 % Gel Commonly known as:  VOLTAREN Apply 1 application topically 2 (two) times daily.   diphenhydrAMINE 25 MG tablet Commonly known as:  BENADRYL Take 25 mg by mouth 3 (three) times daily as needed for itching or allergies.   HYDROcodone-acetaminophen 10-325 MG tablet Commonly known as:  NORCO Take 1 tablet by mouth every 6 (six) hours as needed for moderate pain.   leflunomide 20 MG tablet Commonly known as:  ARAVA Take 20 mg by mouth daily.   metoCLOPramide 5 MG tablet Commonly known as:  REGLAN Take 2-3 times daily as needed for  nausea. What changed:  how much to take  how to take this  when to take this  reasons to take this  additional instructions   mirabegron ER 50 MG Tb24 tablet Commonly known as:  MYRBETRIQ Take 50 mg by mouth daily.   tiZANidine 2 MG tablet Commonly known as:  ZANAFLEX Take 2 mg by mouth at bedtime.   URIBEL 118 MG Caps Take 1 capsule (118 mg total) by mouth 4 (four) times daily as needed.   valsartan 320 MG tablet Commonly known as:  DIOVAN Take 320 mg by mouth daily.   ZEGERID OTC PO Take 1 capsule by mouth daily as needed (heartburn/acid reflux). Reported on 04/08/2015       Followup:  Follow-up Information    Ardis Hughs, MD Follow up on 09/30/2015.   Specialty:  Urology Why:  12:30pm Contact information: [place] [address] [phone]         Patient was seen, examined,treatment plan was discussed with the resident.  I have directly reviewed the clinical findings, lab, imaging studies and management of this patient in detail. I have made the necessary changes and/or additions to the above noted documentation, and agree with the documentation, as recorded by the resident.

## 2015-09-19 NOTE — Progress Notes (Signed)
Urology Inpatient Progress Report  bladder tumor  Procedure(s): TRANSURETHRAL RESECTION OF BLADDER TUMOR (TURBT) BILATERAL RETROGRADE PYELOGRAM  1 Day Post-Op   Intv/Subj: No acute events overnight. Patient is without complaint. Urine clearing on CBI Complaining of abdominal soreness and bladder pressure  Active Problems:   Malignant tumor of trigone of bladder (HCC)  Current Facility-Administered Medications  Medication Dose Route Frequency Provider Last Rate Last Dose  . HYDROcodone-acetaminophen (NORCO) 10-325 MG per tablet 1 tablet  1 tablet Oral Q6H PRN Ardis Hughs, MD   1 tablet at 09/19/15 [phone]  . HYDROmorphone (DILAUDID) injection 0.5-1 mg  0.5-1 mg Intravenous Q2H PRN Ardis Hughs, MD   0.5 mg at 09/18/15 1356  . irbesartan (AVAPRO) tablet 300 mg  300 mg Oral Daily Ardis Hughs, MD   300 mg at 09/19/15 1105  . lactated ringers infusion   Intravenous Continuous Ardis Hughs, MD 100 mL/hr at 09/19/15 1106    . leflunomide (ARAVA) tablet 20 mg  20 mg Oral Daily Ardis Hughs, MD   20 mg at 09/19/15 1105  . metoCLOPramide (REGLAN) tablet 5 mg  5 mg Oral TID PRN Ardis Hughs, MD      . mirabegron ER [facility]) tablet 50 mg  50 mg Oral Daily Ardis Hughs, MD   50 mg at 09/19/15 1105  . opium-belladonna (B&O SUPPRETTES) 16.2-60 MG suppository 1 suppository  1 suppository Rectal Q6H PRN Ardis Hughs, MD   1 suppository at 09/18/15 1703  . tiZANidine (ZANAFLEX) tablet 2 mg  2 mg Oral QHS Ardis Hughs, MD   2 mg at 09/18/15 2143     Objective: Vital: Vitals:   09/18/15 1322 09/18/15 2136 09/19/15 0153 09/19/15 0631  BP: (!) 131/53 (!) 118/54 (!) 114/59 106/74  Pulse: 88 87 80 99  Resp: 14 16 18 18   Temp: 97.6 F (36.4 C) 98.3 F (36.8 C) 98 F (36.7 C) 98.8 F (37.1 C)  TempSrc:  Oral Oral Oral  SpO2: 97% 96% 97% 97%  Weight:      Height:       I/Os: I/O last 3 completed shifts: In: 10723.3 [P.O.:240;  I.V.:2283.3; Other:8200] Out: 10050 [Urine:10050]  Physical Exam:  General: Patient is in no apparent distress Lungs: Normal respiratory effort, chest expands symmetrically. GI: The abdomen is soft and nontender without mass. JP drain with serosanguinous drainage Foley: straw colored urine on slow CBI  Ext: lower extremities symmetric  Lab Results:  Recent Labs  09/16/15 1500 09/18/15 1219 09/19/15 0530  WBC 6.8  --   --   HGB 13.9 11.9* 11.3*  HCT 42.9 36.3 35.1*    Recent Labs  09/18/15 1219 09/19/15 0530  NA 137 141  K 3.5 4.2  CL 109 110  CO2 23 26  GLUCOSE 108* 112*  BUN 15 13  CREATININE 0.84 0.83  CALCIUM 8.0* 8.5*   No results for input(s): LABPT, INR in the last 72 hours. No results for input(s): LABURIN in the last 72 hours. Results for orders placed or performed during the hospital encounter of 09/16/15  Urine culture     Status: None   Collection Time: 09/16/15  3:00 PM  Result Value Ref Range Status   Specimen Description URINE, CLEAN CATCH  Final   Special Requests NONE  Final   Culture NO GROWTH Performed at [facility]   Final   Report Status 09/17/2015 FINAL  Final    Studies/Results: No results found.  Assessment: Procedure(s): TRANSURETHRAL RESECTION OF BLADDER TUMOR (TURBT) BILATERAL RETROGRADE PYELOGRAM, 1 Day Post-Op  doing well.  Plan: Wean CBI, voiding trial in the AM HLIVF ADAT Encourage ambulation. D/c tomorrow.   Louis Meckel, MD Urology 09/19/2015, 1:27 PM

## 2015-09-19 NOTE — Discharge Instructions (Signed)

## 2015-09-23 ENCOUNTER — Other Ambulatory Visit: Payer: Self-pay | Admitting: Urology

## 2015-10-09 DIAGNOSIS — M8000XS Age-related osteoporosis with current pathological fracture, unspecified site, sequela: Secondary | ICD-10-CM | POA: Diagnosis not present

## 2015-10-09 DIAGNOSIS — G894 Chronic pain syndrome: Secondary | ICD-10-CM | POA: Diagnosis not present

## 2015-10-09 DIAGNOSIS — M62830 Muscle spasm of back: Secondary | ICD-10-CM | POA: Diagnosis not present

## 2015-10-09 DIAGNOSIS — M47817 Spondylosis without myelopathy or radiculopathy, lumbosacral region: Secondary | ICD-10-CM | POA: Diagnosis not present

## 2015-10-15 ENCOUNTER — Encounter (HOSPITAL_COMMUNITY): Payer: Self-pay | Admitting: *Deleted

## 2015-10-16 DIAGNOSIS — E789 Disorder of lipoprotein metabolism, unspecified: Secondary | ICD-10-CM | POA: Diagnosis not present

## 2015-10-16 DIAGNOSIS — M810 Age-related osteoporosis without current pathological fracture: Secondary | ICD-10-CM | POA: Diagnosis not present

## 2015-10-16 DIAGNOSIS — I10 Essential (primary) hypertension: Secondary | ICD-10-CM | POA: Diagnosis not present

## 2015-10-16 LAB — HM DEXA SCAN

## 2015-10-17 ENCOUNTER — Ambulatory Visit (HOSPITAL_COMMUNITY): Payer: Medicare Other | Admitting: Anesthesiology

## 2015-10-17 ENCOUNTER — Encounter (HOSPITAL_COMMUNITY): Payer: Self-pay | Admitting: *Deleted

## 2015-10-17 ENCOUNTER — Ambulatory Visit (HOSPITAL_COMMUNITY): Payer: Medicare Other

## 2015-10-17 ENCOUNTER — Encounter (HOSPITAL_COMMUNITY)
Admission: RE | Disposition: A | Discharge status: Home / Self Care | Payer: Self-pay | Source: Ambulatory Visit | Attending: Urology

## 2015-10-17 ENCOUNTER — Ambulatory Visit (HOSPITAL_COMMUNITY)
Admission: RE | Admit: 2015-10-17 | Discharge: 2015-10-17 | Disposition: A | Discharge status: Home / Self Care | Payer: Medicare Other | Source: Ambulatory Visit | Attending: Urology | Admitting: Urology

## 2015-10-17 DIAGNOSIS — N3289 Other specified disorders of bladder: Secondary | ICD-10-CM | POA: Diagnosis not present

## 2015-10-17 DIAGNOSIS — E7800 Pure hypercholesterolemia, unspecified: Secondary | ICD-10-CM | POA: Insufficient documentation

## 2015-10-17 DIAGNOSIS — D414 Neoplasm of uncertain behavior of bladder: Secondary | ICD-10-CM | POA: Diagnosis not present

## 2015-10-17 DIAGNOSIS — I10 Essential (primary) hypertension: Secondary | ICD-10-CM | POA: Insufficient documentation

## 2015-10-17 DIAGNOSIS — C670 Malignant neoplasm of trigone of bladder: Secondary | ICD-10-CM | POA: Insufficient documentation

## 2015-10-17 DIAGNOSIS — M1990 Unspecified osteoarthritis, unspecified site: Secondary | ICD-10-CM | POA: Diagnosis not present

## 2015-10-17 DIAGNOSIS — Z79899 Other long term (current) drug therapy: Secondary | ICD-10-CM | POA: Diagnosis not present

## 2015-10-17 DIAGNOSIS — C672 Malignant neoplasm of lateral wall of bladder: Secondary | ICD-10-CM | POA: Diagnosis not present

## 2015-10-17 DIAGNOSIS — G4730 Sleep apnea, unspecified: Secondary | ICD-10-CM | POA: Insufficient documentation

## 2015-10-17 DIAGNOSIS — D494 Neoplasm of unspecified behavior of bladder: Secondary | ICD-10-CM

## 2015-10-17 DIAGNOSIS — R319 Hematuria, unspecified: Secondary | ICD-10-CM | POA: Diagnosis present

## 2015-10-17 HISTORY — PX: CYSTOSCOPY W/ RETROGRADES: SHX1426

## 2015-10-17 HISTORY — PX: TRANSURETHRAL RESECTION OF BLADDER TUMOR: SHX2575

## 2015-10-17 SURGERY — TURBT (TRANSURETHRAL RESECTION OF BLADDER TUMOR)
Anesthesia: General | Laterality: Right

## 2015-10-17 MED ORDER — FENTANYL CITRATE (PF) 100 MCG/2ML IJ SOLN
INTRAMUSCULAR | Status: AC
  Filled 2015-10-17: qty 2

## 2015-10-17 MED ORDER — SUGAMMADEX SODIUM 200 MG/2ML IV SOLN
INTRAVENOUS | Status: AC
  Filled 2015-10-17: qty 2

## 2015-10-17 MED ORDER — ONDANSETRON HCL 4 MG/2ML IJ SOLN
INTRAMUSCULAR | Status: DC | PRN
Start: 2015-10-17 — End: 2015-10-17
  Administered 2015-10-17: 4 mg via INTRAVENOUS

## 2015-10-17 MED ORDER — PROPOFOL 10 MG/ML IV BOLUS
INTRAVENOUS | Status: AC
  Filled 2015-10-17: qty 20

## 2015-10-17 MED ORDER — PROPOFOL 10 MG/ML IV BOLUS
INTRAVENOUS | Status: DC | PRN
  Administered 2015-10-17: 110 mg via INTRAVENOUS

## 2015-10-17 MED ORDER — SUGAMMADEX SODIUM 200 MG/2ML IV SOLN
INTRAVENOUS | Status: DC | PRN
  Administered 2015-10-17: 200 mg via INTRAVENOUS

## 2015-10-17 MED ORDER — BELLADONNA ALKALOIDS-OPIUM 16.2-60 MG RE SUPP
RECTAL | Status: DC | PRN
  Administered 2015-10-17: 1 via RECTAL

## 2015-10-17 MED ORDER — CIPROFLOXACIN IN D5W 400 MG/200ML IV SOLN
400.0000 mg | INTRAVENOUS | Status: AC
  Administered 2015-10-17: 400 mg via INTRAVENOUS

## 2015-10-17 MED ORDER — SODIUM CHLORIDE 0.9 % IV SOLN
INTRAVENOUS | Status: DC | PRN
  Administered 2015-10-17: 10 mL

## 2015-10-17 MED ORDER — CIPROFLOXACIN IN D5W 400 MG/200ML IV SOLN
INTRAVENOUS | Status: AC
  Filled 2015-10-17: qty 200

## 2015-10-17 MED ORDER — ONDANSETRON HCL 4 MG/2ML IJ SOLN: 4.0000 mg | Freq: Once | INTRAMUSCULAR | Status: DC | PRN

## 2015-10-17 MED ORDER — FENTANYL CITRATE (PF) 100 MCG/2ML IJ SOLN
INTRAMUSCULAR | Status: DC
Start: 2015-10-17 — End: 2015-10-17
  Filled 2015-10-17: qty 2

## 2015-10-17 MED ORDER — LIDOCAINE HCL 2 % EX GEL
CUTANEOUS | Status: DC | PRN
  Administered 2015-10-17: 1 via URETHRAL

## 2015-10-17 MED ORDER — BELLADONNA ALKALOIDS-OPIUM 16.2-60 MG RE SUPP
RECTAL | Status: AC
  Filled 2015-10-17: qty 1

## 2015-10-17 MED ORDER — ROCURONIUM BROMIDE 100 MG/10ML IV SOLN
INTRAVENOUS | Status: AC
  Filled 2015-10-17: qty 1

## 2015-10-17 MED ORDER — FENTANYL CITRATE (PF) 100 MCG/2ML IJ SOLN
INTRAMUSCULAR | Status: DC | PRN
  Administered 2015-10-17 (×2): 50 ug via INTRAVENOUS
  Administered 2015-10-17: 100 ug via INTRAVENOUS

## 2015-10-17 MED ORDER — LACTATED RINGERS IV SOLN
INTRAVENOUS | Status: DC | PRN
  Administered 2015-10-17 (×2): via INTRAVENOUS

## 2015-10-17 MED ORDER — ROCURONIUM BROMIDE 10 MG/ML (PF) SYRINGE
PREFILLED_SYRINGE | INTRAVENOUS | Status: DC | PRN
  Administered 2015-10-17: 40 mg via INTRAVENOUS

## 2015-10-17 MED ORDER — PHENYLEPHRINE HCL 10 MG/ML IJ SOLN
INTRAMUSCULAR | Status: DC | PRN
  Administered 2015-10-17: 160 ug via INTRAVENOUS
  Administered 2015-10-17: 120 ug via INTRAVENOUS
  Administered 2015-10-17 (×2): 40 ug via INTRAVENOUS
  Administered 2015-10-17 (×2): 80 ug via INTRAVENOUS
  Administered 2015-10-17: 40 ug via INTRAVENOUS
  Administered 2015-10-17 (×2): 80 ug via INTRAVENOUS

## 2015-10-17 MED ORDER — LIDOCAINE HCL 2 % EX GEL
CUTANEOUS | Status: AC
  Filled 2015-10-17: qty 5

## 2015-10-17 MED ORDER — LIDOCAINE 2% (20 MG/ML) 5 ML SYRINGE
INTRAMUSCULAR | Status: DC | PRN
  Administered 2015-10-17: 80 mg via INTRAVENOUS

## 2015-10-17 MED ORDER — SUCCINYLCHOLINE CHLORIDE 200 MG/10ML IV SOSY
PREFILLED_SYRINGE | INTRAVENOUS | Status: DC | PRN
  Administered 2015-10-17: 40 mg via INTRAVENOUS

## 2015-10-17 MED ORDER — LIDOCAINE 2% (20 MG/ML) 5 ML SYRINGE
INTRAMUSCULAR | Status: AC
  Filled 2015-10-17: qty 5

## 2015-10-17 MED ORDER — FENTANYL CITRATE (PF) 100 MCG/2ML IJ SOLN
25.0000 ug | INTRAMUSCULAR | Status: DC | PRN
  Administered 2015-10-17 (×2): 50 ug via INTRAVENOUS

## 2015-10-17 MED ORDER — DEXAMETHASONE SODIUM PHOSPHATE 10 MG/ML IJ SOLN
INTRAMUSCULAR | Status: DC | PRN
  Administered 2015-10-17: 10 mg via INTRAVENOUS

## 2015-10-17 SURGICAL SUPPLY — 37 items
BAG URINE DRAINAGE (UROLOGICAL SUPPLIES) ×2 IMPLANT
BAG URO CATCHER STRL LF (MISCELLANEOUS) ×4 IMPLANT
BASKET DAKOTA 1.9FR 11X120 (BASKET) IMPLANT
BASKET ZERO TIP NITINOL 2.4FR (BASKET) IMPLANT
BSKT STON RTRVL ZERO TP 2.4FR (BASKET)
CATH FOLEY 2WAY SLVR  5CC 20FR (CATHETERS) ×2
CATH FOLEY 2WAY SLVR 30CC 24FR (CATHETERS) IMPLANT
CATH FOLEY 2WAY SLVR 5CC 20FR (CATHETERS) IMPLANT
CATH FOLEY 3WAY 30CC 20FR (CATHETERS) IMPLANT
CATH URET 5FR 28IN OPEN ENDED (CATHETERS) ×4 IMPLANT
CLOTH BEACON ORANGE TIMEOUT ST (SAFETY) ×4 IMPLANT
ELECT REM PT RETURN 9FT ADLT (ELECTROSURGICAL) ×4
ELECTRODE REM PT RTRN 9FT ADLT (ELECTROSURGICAL) ×2 IMPLANT
EVACUATOR MICROVAS BLADDER (UROLOGICAL SUPPLIES) IMPLANT
GLOVE BIOGEL M 8.0 STRL (GLOVE) ×2 IMPLANT
GLOVE BIOGEL M STRL SZ7.5 (GLOVE) ×4 IMPLANT
GOWN STRL REUS W/ TWL XL LVL3 (GOWN DISPOSABLE) ×2 IMPLANT
GOWN STRL REUS W/TWL XL LVL3 (GOWN DISPOSABLE) ×6 IMPLANT
GUIDEWIRE ANG ZIPWIRE 038X150 (WIRE) IMPLANT
GUIDEWIRE STR DUAL SENSOR (WIRE) ×4 IMPLANT
HOLDER FOLEY CATH W/STRAP (MISCELLANEOUS) ×2 IMPLANT
LOOP CUT BIPOLAR 24F LRG (ELECTROSURGICAL) ×4 IMPLANT
MANIFOLD NEPTUNE II (INSTRUMENTS) ×4 IMPLANT
NDL SAFETY ECLIPSE 18X1.5 (NEEDLE) ×2 IMPLANT
NEEDLE HYPO 18GX1.5 SHARP (NEEDLE)
NS IRRIG 1000ML POUR BTL (IV SOLUTION) ×2 IMPLANT
PACK CYSTO (CUSTOM PROCEDURE TRAY) ×4 IMPLANT
SET ASPIRATION TUBING (TUBING) IMPLANT
SHEATH ACCESS URETERAL 24CM (SHEATH) IMPLANT
SHEATH ACCESS URETERAL 38CM (SHEATH) IMPLANT
SHEATH ACCESS URETERAL 54CM (SHEATH) IMPLANT
SYRINGE 3CC LL L/F (MISCELLANEOUS) ×2 IMPLANT
SYRINGE IRR TOOMEY STRL 70CC (SYRINGE) ×2 IMPLANT
TUBING CONNECTING 10 (TUBING) ×3 IMPLANT
TUBING CONNECTING 10' (TUBING) ×1
WATER STERILE IRR 3000ML UROMA (IV SOLUTION) ×2 IMPLANT
WATER STERILE IRR 500ML POUR (IV SOLUTION) ×2 IMPLANT

## 2015-10-17 NOTE — H&P (View-Only) (Signed)
Tara Ingram is a 70 year-old female patient who was referred by Dr. Arleta Creek, MD who is here for blood in the urine.  She first noticed the symptoms approximately 08/02/2014. She did see the blood in her urine. She has not seen blood clots.   She does not have a burning sensation when she urinates. She is not currently having trouble urinating.   She has had kidney stones. She is not having pain. She has not recently had unwanted weight loss.   Patient has been having blood in urine off and on for a couple of years. She noticed this occurs when she takes certain medications such as BC Powder for headaches and celebrex.     CC: I have urinary urgency.  HPI: She does have urgency. She does have problems getting to the bathroom in time after she has the urge to urinate. The condition started approximately 07/03/2015. Her symptoms have gotten worse over the last year.   She does wear protective pads. She wears 2-3 pads per day. She urinates every hour in the daytime. She gets up at night to urinate 3 times. She is having problems with emptying her bladder well.   The patient's urgency symptoms have been present for approximately one month. She denies any fevers or chills. She has not been treated with antibiotic. She denies any flank pain.     ALLERGIES: None   MEDICATIONS: Myrbetriq 50 mg tablet, extended release 24 hr  Cholestyramine 4 gram powder  Leflunomide 10 mg tablet  Tizanidine Hcl 2 mg tablet  Valsartan-Hydrochlorothiazide 320 mg-25 mg tablet     GU PSH: Hysterectomy    NON-GU PSH: Eye Surgery Procedure, Corneal transplants-both eyes Lumbar Laminectomy    GU PMH: None   NON-GU PMH: Essential (primary) hypertension Pure hypercholesterolemia, unspecified Sleep apnea, unspecified Unspecified osteoarthritis, unspecified site    FAMILY HISTORY: Diabetes - Sister Heart Attack - Father   SOCIAL HISTORY: Marital Status: Married Current Smoking Status: Patient has  never smoked.  Has never drank.  Drinks 4+ caffeinated drinks per day. Patient's occupation is/was Retired.    REVIEW OF SYSTEMS:    GU Review Female:   Patient reports frequent urination, hard to postpone urination, get up at night to urinate, and leakage of urine. Patient denies burning /pain with urination, stream starts and stops, trouble starting your stream, have to strain to urinate, and currently pregnant.  Gastrointestinal (Upper):   Patient reports indigestion/ heartburn. Patient denies nausea and vomiting.  Gastrointestinal (Lower):   Patient denies diarrhea and constipation.  Constitutional:   Patient denies fever, night sweats, weight loss, and fatigue.  Skin:   Patient denies skin rash/ lesion and itching.  Eyes:   Patient denies blurred vision and double vision.  Ears/ Nose/ Throat:   Patient reports sore throat and sinus problems.   Hematologic/Lymphatic:   Patient denies easy bruising and swollen glands.  Cardiovascular:   Patient denies leg swelling and chest pains.  Respiratory:   Patient denies cough and shortness of breath.  Endocrine:   Patient denies excessive thirst.  Musculoskeletal:   Patient reports back pain and joint pain.   Neurological:   Patient reports headaches. Patient denies dizziness.  Psychologic:   Patient denies depression and anxiety.   VITAL SIGNS:      08/08/2015 03:14 PM  Weight 170 lb / 77.11 kg  Height 64 in / 162.56 cm  BP 114/64 mmHg  Pulse 69 /min  Temperature 97.9 F / 37 C  BMI 29.2 kg/m   MULTI-SYSTEM PHYSICAL EXAMINATION:    Constitutional: Well-nourished. No physical deformities. Normally developed. Good grooming.  Neck: Neck symmetrical, not swollen. Normal tracheal position.  Respiratory: No labored breathing, no use of accessory muscles.   Cardiovascular: Normal temperature, normal extremity pulses, no swelling, no varicosities.  Lymphatic: No enlargement of neck, axillae, groin.  Skin: No paleness, no jaundice, no  cyanosis. No lesion, no ulcer, no rash.  Neurologic / Psychiatric: Oriented to time, oriented to place, oriented to person. No depression, no anxiety, no agitation.  Gastrointestinal: No mass, no tenderness, no rigidity, non obese abdomen.  Eyes: Normal conjunctivae. Normal eyelids.  Ears, Nose, Mouth, and Throat: Left ear no scars, no lesions, no masses. Right ear no scars, no lesions, no masses. Nose no scars, no lesions, no masses. Normal hearing. Normal lips.  Musculoskeletal: Normal gait and station of head and neck.     PAST DATA REVIEWED:  Source Of History:  Patient  Records Review:   Previous Doctor Records, Previous Patient Records  Urine Test Review:   Urinalysis   PROCEDURES:           PVR Ultrasound - [id number]  Scanned Volume: 188 cc         Urinalysis w/Scope - 81001 Dipstick Dipstick Cont'd Micro  Specimen: Voided Bilirubin: Neg WBC/hpf: 20-40/hpf  Color: Straw Ketones: Neg RBC/hpf: 40-60/hpf  Appearance: Cloudy Blood: 3+ Bacteria: Many (>50/hpf)  Specific Gravity: 1.015 Protein: 1+ Cystals: NS (Not Seen)  pH: 6.0 Urobilinogen: 0.2 Casts: NS (Not Seen)  Glucose: Neg Nitrites: Neg Trichomonas: Not Present    Leukocyte Esterase: 1+ Mucous: Not Present      Epithelial Cells: NS (Not Seen)      Yeast: NS (Not Seen)      Sperm: Not Present    ASSESSMENT:      ICD-10 Details  1 GU:   Gross hematuria - R31.0   2   Urgency of urination - R39.15    PLAN:           Orders Labs BMP          Schedule Labs: 2 Weeks - Urinalysis  X-Rays: 2 Weeks - C.T. Abdomen/Pelvis With and Without I.V. Contrast  Procedure: Unspecified Date at [facility] [phone] - Flexible Cystoscopy (Cystoscopy) - 52000          Document Letter(s):  Created for Patient: Clinical Summary         Notes:   The patient has gross hematuria and progressive voiding symptoms. Her symptoms have improved with myrbetriq 50 mg daily. However, I'm concerned that she may have  some bladder pathology that needs to be further evaluated. As such, I have gone over the gross hematuria evaluation with the patient recommended that she undergo a CT scan, hematuria protocol as well as cystoscopy. The patient like that cystoscopy performed on her return visit. We will obtain labs today so that We can evaluate her renal function prior to giving her IV contrast.    Addendum: CT scan demonstrates a large bladder tumor protruding form the floor of the bladder.  I discussed with the patient the findings and recommended that we proceed to the OR for resection.  I  Discussed the risk/benefits of this operation and the potential for further surgery following the initial resection.  We will get surgical clearance and schedule her ASAP.

## 2015-10-17 NOTE — Transfer of Care (Signed)
Immediate Anesthesia Transfer of Care Note  Patient: Tara Ingram [facility]  Procedure(s) Performed: Procedure(s): TRANSURETHRAL RESECTION OF BLADDER TUMOR (TURBT) (N/A) CYSTOSCOPY WITH RETROGRADE PYELOGRAM (Right)  Patient Location: PACU  Anesthesia Type:General  Level of Consciousness: sedated  Airway & Oxygen Therapy: Patient Spontanous Breathing and Patient connected to face mask oxygen  Post-op Assessment: Report given to RN and Post -op Vital signs reviewed and stable  Post vital signs: Reviewed and stable  Last Vitals:  Vitals:   10/17/15 1049  BP: (!) 145/62  Pulse: 77  Resp: 18  Temp: 36.7 C    Last Pain:  Vitals:   10/17/15 1049  TempSrc: Oral         Complications: No apparent anesthesia complications

## 2015-10-17 NOTE — Anesthesia Preprocedure Evaluation (Signed)
Anesthesia Evaluation  Patient identified by MRN, date of birth, ID band Patient awake    Reviewed: Allergy & Precautions, NPO status , Patient's Chart, lab work & pertinent test results  History of Anesthesia Complications Negative for: history of anesthetic complications  Airway Mallampati: II  TM Distance: >3 FB Neck ROM: Full    Dental no notable dental hx. (+) Dental Advisory Given   Pulmonary sleep apnea ,    Pulmonary exam normal breath sounds clear to auscultation       Cardiovascular hypertension, Pt. on medications Normal cardiovascular exam Rhythm:Regular Rate:Normal     Neuro/Psych  Headaches, negative psych ROS   GI/Hepatic Neg liver ROS, GERD  Controlled,  Endo/Other  negative endocrine ROS  Renal/GU negative Renal ROS  negative genitourinary   Musculoskeletal  (+) Arthritis ,   Abdominal   Peds negative pediatric ROS (+)  Hematology negative hematology ROS (+)   Anesthesia Other Findings   Reproductive/Obstetrics negative OB ROS                             Anesthesia Physical Anesthesia Plan  ASA: II  Anesthesia Plan: General   Post-op Pain Management:    Induction: Intravenous  Airway Management Planned: LMA  Additional Equipment:   Intra-op Plan:   Post-operative Plan: Extubation in OR  Informed Consent: I have reviewed the patients History and Physical, chart, labs and discussed the procedure including the risks, benefits and alternatives for the proposed anesthesia with the patient or authorized representative who has indicated his/her understanding and acceptance.   Dental advisory given  Plan Discussed with: CRNA  Anesthesia Plan Comments:         Anesthesia Quick Evaluation

## 2015-10-17 NOTE — Anesthesia Procedure Notes (Addendum)
Procedure Name: Intubation Date/Time: 10/17/2015 12:13 PM Performed by: Lind Covert Pre-anesthesia Checklist: Patient identified, Emergency Drugs available, Suction available and Patient being monitored Patient Re-evaluated:Patient Re-evaluated prior to inductionOxygen Delivery Method: Circle system utilized Preoxygenation: Pre-oxygenation with 100% oxygen Intubation Type: IV induction Ventilation: Mask ventilation without difficulty Laryngoscope Size: Mac and 3 Grade View: Grade I Tube type: Oral Tube size: 7.0 mm Number of attempts: 1 Airway Equipment and Method: Stylet Placement Confirmation: ETT inserted through vocal cords under direct vision and positive ETCO2 Secured at: 21 cm Tube secured with: Tape Dental Injury: Teeth and Oropharynx as per pre-operative assessment

## 2015-10-17 NOTE — Op Note (Signed)
Preoperative diagnosis:  1.  High-grade non-muscle invasive bladder cancer  Postoperative diagnosis:  1. Same   Procedure: #1. Second stage of TURBT, tumor located on the left lateral wall and trigone region, greater than 5 cm #2. Right retrograde pyelogram with interpretation  Surgeon: Ardis Hughs, MD  Anesthesia: General  Complications: None  Intraoperative findings: 10 mL of Omnipaque contrast was instilled into the patient's right ureteral orifice using a 5 [place] open-ended ureteral catheter demonstrating a normal caliber ureter with no filling defects, sharp calyces, and no hydronephrosis.  EBL: Approximately 200 mL  Specimens: Bladder tumor, left lateral wall and trigone region  Indication: Tara Ingram is a 70 y.o. patient with a large bladder tumor requiring a stage procedure. Her initial path came back as high-grade non-muscle invasive bladder cancer.  After reviewing the management options for treatment, he elected to proceed with the above surgical procedure(s). We have discussed the potential benefits and risks of the procedure, side effects of the proposed treatment, the likelihood of the patient achieving the goals of the procedure, and any potential problems that might occur during the procedure or recuperation. Informed consent has been obtained.  Description of procedure:  The patient was taken to the operating room and general anesthesia was induced.  The patient was placed in the dorsal lithotomy position, prepped and draped in the usual sterile fashion, and preoperative antibiotics were administered. A preoperative time-out was performed.   A 21 French 30 cystoscope was gently inserted into the patient's urethra and into the bladder under visual guidance. Cystoscopy confirmed extensive tumor overlying the left lateral wall and trigonal region. There was scar tissue and necrotic debris from the previous resection on the right lateral wall and trigonal  region. The right ureteral orifice was found and I was able to cannulate it with a 5 [place] open-ended ureteral catheter. A retrograde pyelogram was performed as described above.  I then removed the cystoscope and inserted a 26 French resectoscope sheath using the obturator. I then exchanged the obturator for a resectoscope and a 30 lens. I then proceeded to perform a TURBT on the remaining tumor which was greater than 5 cm. The tumor was extensive from the trigonal region extending laterally onto the left lateral wall.  Once all the bladder tumor chips had been removed I then irrigated the patient's bladder using sterile water. Hemostasis was noted to be quite good. I inserted lidocaine jelly into the patient's urethra and a B&O suppository into her rectum. I subsequently placed a 20 [place] two-way Foley catheter and instilled 10 mL. The patient will be discharged home with a plan to follow up on Monday for a voiding trial.  Ardis Hughs, M.D.

## 2015-10-17 NOTE — Interval H&P Note (Signed)
History and Physical Interval Note: Patient presents today for the second stage of her staged TURBT. Since she was last seen in discharge from the hospital she has had no changes to her history and physical. Our plan today is to proceed with TURBT.  Vitals:   10/17/15 1049  BP: (!) 145/62  Pulse: 77  Resp: 18  Temp: 98 F (36.7 C)   NAD RRR CTA-B 10/17/2015 11:46 AM  Tara Ingram  has presented today for surgery, with the diagnosis of bladder mass  The various methods of treatment have been discussed with the patient and family. After consideration of risks, benefits and other options for treatment, the patient has consented to  Procedure(s): TRANSURETHRAL RESECTION OF BLADDER TUMOR (TURBT) (N/A) CYSTOSCOPY WITH RETROGRADE PYELOGRAM (Bilateral) as a surgical intervention .  The patient's history has been reviewed, patient examined, no change in status, stable for surgery.  I have reviewed the patient's chart and labs.  Questions were answered to the patient's satisfaction.     Louis Meckel W

## 2015-10-17 NOTE — Discharge Instructions (Signed)
Foley Catheter Care, Adult A soft, flexible tube (Foley catheter) has been placed in your bladder. This may be done to temporarily help with urine drainage after an operation or to relieve blockage from an enlarged prostate gland. HOME CARE INSTRUCTIONS  If you are going home with a Foley catheter in place, follow these instructions: Taking Care of the Catheter:  Keep the area where the catheter leaves your body clean.  Attach the catheter to the leg so there is no tension on the catheter.  Keep the drainage bag below the level of the bladder, but keep it OFF the floor.  Do not take long soaking baths.   Wash your hands before touching ANYTHING related to the catheter or bag.  Using mild soap and warm water on a washcloth:  Clean the area closest to the catheter insertion site using a circular motion around the catheter.  Clean the catheter itself by wiping AWAY from the insertion site for several inches down the tube.  NEVER wipe upward as this could sweep bacteria up into the urethra (tube in your body that normally drains the bladder) and cause infection. Taking Care of the Drainage Bags:  Two drainage bags will be taken home: a large overnight drainage bag, and a smaller leg bag which fits underneath clothing.  It is okay to wear the overnight bag at any time, but NEVER wear the smaller leg bag at night.  Keep the drainage bag well below the level of your bladder. This prevents backflow of urine into the bladder and allows the urine to drain freely.  Anchor the tubing to your leg to prevent pulling or tension on the catheter. Use tape or a leg strap provided by the hospital.  Empty the drainage bag when it is  to  full. Wash your hands before and after touching the bag.  Periodically check the tubing for kinks to make sure there is no pressure on the tubing which could restrict the flow of urine. Changing the Drainage Bags:  Cleanse both ends of the clean bag with alcohol  before changing.  Pinch off the rubber catheter to avoid urine spillage during the disconnection.  Disconnect the dirty bag and connect the clean one.  Empty the dirty bag carefully to avoid a urine spill.  Attach the new bag to the leg with tape or a leg strap. Cleaning the Drainage Bags:  Whenever a drainage bag is disconnected, it must be cleaned quickly so it is ready for the next use.  Wash the bag in warm, soapy water.  Rinse the bag thoroughly with warm water.  Soak the bag for 30 minutes in a solution of white vinegar and water (1 cup vinegar to 1 quart warm water).  Rinse with warm water. SEEK MEDICAL CARE IF:   Some pain develops in the kidney (lower back) area.  The urine is cloudy or smells bad.  There is some blood in the urine.  The catheter becomes clogged and/or there is no urine drainage. SEEK IMMEDIATE MEDICAL CARE IF:   You have moderate or severe pain in the kidney region.  You start to throw up (vomit).  Blood fills the tube.  Worsening belly (abdominal) pain develops.  You have a fever. MAKE SURE YOU:   Understand these instructions.  Will watch your condition.  Will get help right away if you are not doing well or get worse.  Call [facility] if you have any questions or concerns: [phone]   Transurethral Resection  of Bladder Tumor (TURBT) or Bladder Biopsy   Definition:  Transurethral Resection of the Bladder Tumor is a surgical procedure used to diagnose and remove tumors within the bladder. TURBT is the most common treatment for early stage bladder cancer.  General instructions:     Your recent bladder surgery requires very little post hospital care but some definite precautions.  Despite the fact that no skin incisions were used, the area around the bladder incisions are raw and covered with scabs to promote healing and prevent bleeding. Certain precautions are needed to insure that the scabs are not disturbed over the  next 2-4 weeks while the healing proceeds.  Because the raw surface inside your bladder and the irritating effects of urine you may expect frequency of urination and/or urgency (a stronger desire to urinate) and perhaps even getting up at night more often. This will usually resolve or improve slowly over the healing period. You may see some blood in your urine over the first 6 weeks. Do not be alarmed, even if the urine was clear for a while. Get off your feet and drink lots of fluids until clearing occurs. If you start to pass clots or don't improve call us.  Diet:  You may return to your normal diet immediately. Because of the raw surface of your bladder, alcohol, spicy foods, foods high in acid and drinks with caffeine may cause irritation or frequency and should be used in moderation. To keep your urine flowing freely and avoid constipation, drink plenty of fluids during the day (8-10 glasses). Tip: Avoid cranberry juice because it is very acidic.  Activity:  Your physical activity doesn't need to be restricted. However, if you are very active, you may see some blood in the urine. We suggest that you reduce your activity under the circumstances until the bleeding has stopped.  Bowels:  It is important to keep your bowels regular during the postoperative period. Straining with bowel movements can cause bleeding. A bowel movement every other day is reasonable. Use a mild laxative if needed, such as milk of magnesia 2-3 tablespoons, or 2 Dulcolax tablets. Call if you continue to have problems. If you had been taking narcotics for pain, before, during or after your surgery, you may be constipated. Take a laxative if necessary.    Medication:  You should resume your pre-surgery medications unless told not to. In addition you may be given an antibiotic to prevent or treat infection. Antibiotics are not always necessary. All medication should be taken as prescribed until the bottles are finished  unless you are having an unusual reaction to one of the drugs.   General Anesthesia, Adult, Care After Refer to this sheet in the next few weeks. These instructions provide you with information on caring for yourself after your procedure. Your health care provider may also give you more specific instructions. Your treatment has been planned according to current medical practices, but problems sometimes occur. Call your health care provider if you have any problems or questions after your procedure. WHAT TO EXPECT AFTER THE PROCEDURE After the procedure, it is typical to experience:  Sleepiness.  Nausea and vomiting. HOME CARE INSTRUCTIONS  For the first 24 hours after general anesthesia:  Have a responsible person with you.  Do not drive a car. If you are alone, do not take public transportation.  Do not drink alcohol.  Do not take medicine that has not been prescribed by your health care provider.  Do not sign important papers  or make important decisions.  You may resume a normal diet and activities as directed by your health care provider.  Change bandages (dressings) as directed.  If you have questions or problems that seem related to general anesthesia, call the hospital and ask for the anesthetist or anesthesiologist on call. SEEK MEDICAL CARE IF:  You have nausea and vomiting that continue the day after anesthesia.  You develop a rash. SEEK IMMEDIATE MEDICAL CARE IF:   You have difficulty breathing.  You have chest pain.  You have any allergic problems.   This information is not intended to replace advice given to you by your health care provider. Make sure you discuss any questions you have with your health care provider.   Document Released: 04/26/2000 Document Revised: 02/08/2014 Document Reviewed: 05/19/2011 Elsevier Interactive Patient Education [employer].

## 2015-10-20 ENCOUNTER — Encounter (HOSPITAL_COMMUNITY): Payer: Self-pay | Admitting: Urology

## 2015-10-20 DIAGNOSIS — R310 Gross hematuria: Secondary | ICD-10-CM | POA: Diagnosis not present

## 2015-10-20 NOTE — Anesthesia Postprocedure Evaluation (Signed)
Anesthesia Post Note  Patient: Tara Ingram [facility]  Procedure(s) Performed: Procedure(s) (LRB): TRANSURETHRAL RESECTION OF BLADDER TUMOR (TURBT) (N/A) CYSTOSCOPY WITH RETROGRADE PYELOGRAM (Right)  Anesthesia Type: General Vital Signs Assessment: post-procedure vital signs reviewed and stable Anesthetic complications: no     Last Vitals:  Vitals:   10/17/15 1520 10/17/15 1610  BP: (!) 125/93 138/68  Pulse: 70 84  Resp: 16 14  Temp: 36.4 C 37 C    Last Pain:  Vitals:   10/17/15 1610  TempSrc: Oral  PainSc: 3    Pain Goal: Patients Stated Pain Goal: 3 (10/17/15 1610)               [name]

## 2015-10-22 DIAGNOSIS — M810 Age-related osteoporosis without current pathological fracture: Secondary | ICD-10-CM | POA: Diagnosis not present

## 2015-10-23 DIAGNOSIS — Z23 Encounter for immunization: Secondary | ICD-10-CM | POA: Diagnosis not present

## 2015-10-23 DIAGNOSIS — I10 Essential (primary) hypertension: Secondary | ICD-10-CM | POA: Diagnosis not present

## 2015-10-23 DIAGNOSIS — G4733 Obstructive sleep apnea (adult) (pediatric): Secondary | ICD-10-CM | POA: Diagnosis not present

## 2015-10-31 DIAGNOSIS — C678 Malignant neoplasm of overlapping sites of bladder: Secondary | ICD-10-CM | POA: Diagnosis not present

## 2015-12-02 ENCOUNTER — Ambulatory Visit: Payer: Medicare Other | Admitting: Pulmonary Disease

## 2015-12-10 DIAGNOSIS — B9620 Unspecified Escherichia coli [E. coli] as the cause of diseases classified elsewhere: Secondary | ICD-10-CM | POA: Diagnosis not present

## 2015-12-10 DIAGNOSIS — N390 Urinary tract infection, site not specified: Secondary | ICD-10-CM | POA: Diagnosis not present

## 2015-12-11 DIAGNOSIS — G894 Chronic pain syndrome: Secondary | ICD-10-CM | POA: Diagnosis not present

## 2015-12-11 DIAGNOSIS — M47817 Spondylosis without myelopathy or radiculopathy, lumbosacral region: Secondary | ICD-10-CM | POA: Diagnosis not present

## 2015-12-11 DIAGNOSIS — M8000XS Age-related osteoporosis with current pathological fracture, unspecified site, sequela: Secondary | ICD-10-CM | POA: Diagnosis not present

## 2015-12-11 DIAGNOSIS — M62830 Muscle spasm of back: Secondary | ICD-10-CM | POA: Diagnosis not present

## 2015-12-15 DIAGNOSIS — M1990 Unspecified osteoarthritis, unspecified site: Secondary | ICD-10-CM | POA: Diagnosis not present

## 2015-12-15 DIAGNOSIS — M150 Primary generalized (osteo)arthritis: Secondary | ICD-10-CM | POA: Diagnosis not present

## 2015-12-15 DIAGNOSIS — M0579 Rheumatoid arthritis with rheumatoid factor of multiple sites without organ or systems involvement: Secondary | ICD-10-CM | POA: Diagnosis not present

## 2015-12-15 DIAGNOSIS — Z79899 Other long term (current) drug therapy: Secondary | ICD-10-CM | POA: Diagnosis not present

## 2015-12-15 DIAGNOSIS — M810 Age-related osteoporosis without current pathological fracture: Secondary | ICD-10-CM | POA: Diagnosis not present

## 2015-12-15 DIAGNOSIS — S32000D Wedge compression fracture of unspecified lumbar vertebra, subsequent encounter for fracture with routine healing: Secondary | ICD-10-CM | POA: Diagnosis not present

## 2015-12-24 DIAGNOSIS — Z5111 Encounter for antineoplastic chemotherapy: Secondary | ICD-10-CM | POA: Diagnosis not present

## 2015-12-24 DIAGNOSIS — C678 Malignant neoplasm of overlapping sites of bladder: Secondary | ICD-10-CM | POA: Diagnosis not present

## 2015-12-31 DIAGNOSIS — C678 Malignant neoplasm of overlapping sites of bladder: Secondary | ICD-10-CM | POA: Diagnosis not present

## 2015-12-31 DIAGNOSIS — Z5111 Encounter for antineoplastic chemotherapy: Secondary | ICD-10-CM | POA: Diagnosis not present

## 2015-12-31 DIAGNOSIS — Z947 Corneal transplant status: Secondary | ICD-10-CM | POA: Diagnosis not present

## 2015-12-31 DIAGNOSIS — H2513 Age-related nuclear cataract, bilateral: Secondary | ICD-10-CM | POA: Diagnosis not present

## 2016-01-08 DIAGNOSIS — C678 Malignant neoplasm of overlapping sites of bladder: Secondary | ICD-10-CM | POA: Diagnosis not present

## 2016-01-08 DIAGNOSIS — Z5111 Encounter for antineoplastic chemotherapy: Secondary | ICD-10-CM | POA: Diagnosis not present

## 2016-01-09 ENCOUNTER — Ambulatory Visit: Payer: Medicare Other | Admitting: Pulmonary Disease

## 2016-01-15 DIAGNOSIS — Z5111 Encounter for antineoplastic chemotherapy: Secondary | ICD-10-CM | POA: Diagnosis not present

## 2016-01-15 DIAGNOSIS — C678 Malignant neoplasm of overlapping sites of bladder: Secondary | ICD-10-CM | POA: Diagnosis not present

## 2016-01-22 DIAGNOSIS — Z5111 Encounter for antineoplastic chemotherapy: Secondary | ICD-10-CM | POA: Diagnosis not present

## 2016-01-22 DIAGNOSIS — C678 Malignant neoplasm of overlapping sites of bladder: Secondary | ICD-10-CM | POA: Diagnosis not present

## 2016-01-29 DIAGNOSIS — C678 Malignant neoplasm of overlapping sites of bladder: Secondary | ICD-10-CM | POA: Diagnosis not present

## 2016-01-29 DIAGNOSIS — Z5111 Encounter for antineoplastic chemotherapy: Secondary | ICD-10-CM | POA: Diagnosis not present

## 2016-02-03 DIAGNOSIS — L0291 Cutaneous abscess, unspecified: Secondary | ICD-10-CM | POA: Diagnosis not present

## 2016-02-04 DIAGNOSIS — M47817 Spondylosis without myelopathy or radiculopathy, lumbosacral region: Secondary | ICD-10-CM | POA: Diagnosis not present

## 2016-02-04 DIAGNOSIS — M62830 Muscle spasm of back: Secondary | ICD-10-CM | POA: Diagnosis not present

## 2016-02-04 DIAGNOSIS — G894 Chronic pain syndrome: Secondary | ICD-10-CM | POA: Diagnosis not present

## 2016-02-04 DIAGNOSIS — M8000XS Age-related osteoporosis with current pathological fracture, unspecified site, sequela: Secondary | ICD-10-CM | POA: Diagnosis not present

## 2016-03-02 DIAGNOSIS — M47817 Spondylosis without myelopathy or radiculopathy, lumbosacral region: Secondary | ICD-10-CM | POA: Diagnosis not present

## 2016-03-02 DIAGNOSIS — M62830 Muscle spasm of back: Secondary | ICD-10-CM | POA: Diagnosis not present

## 2016-03-02 DIAGNOSIS — G894 Chronic pain syndrome: Secondary | ICD-10-CM | POA: Diagnosis not present

## 2016-03-02 DIAGNOSIS — M8000XS Age-related osteoporosis with current pathological fracture, unspecified site, sequela: Secondary | ICD-10-CM | POA: Diagnosis not present

## 2016-03-10 ENCOUNTER — Other Ambulatory Visit: Payer: Self-pay | Admitting: Physical Medicine and Rehabilitation

## 2016-03-10 DIAGNOSIS — M545 Low back pain: Secondary | ICD-10-CM

## 2016-03-16 DIAGNOSIS — M150 Primary generalized (osteo)arthritis: Secondary | ICD-10-CM | POA: Diagnosis not present

## 2016-03-16 DIAGNOSIS — E663 Overweight: Secondary | ICD-10-CM | POA: Diagnosis not present

## 2016-03-16 DIAGNOSIS — M0579 Rheumatoid arthritis with rheumatoid factor of multiple sites without organ or systems involvement: Secondary | ICD-10-CM | POA: Diagnosis not present

## 2016-03-16 DIAGNOSIS — Z6828 Body mass index (BMI) 28.0-28.9, adult: Secondary | ICD-10-CM | POA: Diagnosis not present

## 2016-03-16 DIAGNOSIS — Z79899 Other long term (current) drug therapy: Secondary | ICD-10-CM | POA: Diagnosis not present

## 2016-03-16 DIAGNOSIS — S32000D Wedge compression fracture of unspecified lumbar vertebra, subsequent encounter for fracture with routine healing: Secondary | ICD-10-CM | POA: Diagnosis not present

## 2016-03-16 DIAGNOSIS — M810 Age-related osteoporosis without current pathological fracture: Secondary | ICD-10-CM | POA: Diagnosis not present

## 2016-03-19 ENCOUNTER — Ambulatory Visit
Admission: RE | Admit: 2016-03-19 | Discharge: 2016-03-19 | Disposition: A | Discharge status: Home / Self Care | Payer: Medicare Other | Source: Ambulatory Visit | Attending: Physical Medicine and Rehabilitation | Admitting: Physical Medicine and Rehabilitation

## 2016-03-19 DIAGNOSIS — M545 Low back pain: Secondary | ICD-10-CM

## 2016-03-19 DIAGNOSIS — M5126 Other intervertebral disc displacement, lumbar region: Secondary | ICD-10-CM | POA: Diagnosis not present

## 2016-03-19 MED ORDER — GADOBENATE DIMEGLUMINE 529 MG/ML IV SOLN
15.0000 mL | Freq: Once | INTRAVENOUS | Status: AC | PRN
  Administered 2016-03-19: 15 mL via INTRAVENOUS

## 2016-04-06 DIAGNOSIS — M62830 Muscle spasm of back: Secondary | ICD-10-CM | POA: Diagnosis not present

## 2016-04-06 DIAGNOSIS — G894 Chronic pain syndrome: Secondary | ICD-10-CM | POA: Diagnosis not present

## 2016-04-06 DIAGNOSIS — M47817 Spondylosis without myelopathy or radiculopathy, lumbosacral region: Secondary | ICD-10-CM | POA: Diagnosis not present

## 2016-04-06 DIAGNOSIS — M8000XS Age-related osteoporosis with current pathological fracture, unspecified site, sequela: Secondary | ICD-10-CM | POA: Diagnosis not present

## 2016-04-29 DIAGNOSIS — R310 Gross hematuria: Secondary | ICD-10-CM | POA: Diagnosis not present

## 2016-04-29 DIAGNOSIS — C678 Malignant neoplasm of overlapping sites of bladder: Secondary | ICD-10-CM | POA: Diagnosis not present

## 2016-05-03 DIAGNOSIS — M47817 Spondylosis without myelopathy or radiculopathy, lumbosacral region: Secondary | ICD-10-CM | POA: Diagnosis not present

## 2016-05-04 DIAGNOSIS — M810 Age-related osteoporosis without current pathological fracture: Secondary | ICD-10-CM | POA: Diagnosis not present

## 2016-05-26 DIAGNOSIS — C678 Malignant neoplasm of overlapping sites of bladder: Secondary | ICD-10-CM | POA: Diagnosis not present

## 2016-05-26 DIAGNOSIS — Z5111 Encounter for antineoplastic chemotherapy: Secondary | ICD-10-CM | POA: Diagnosis not present

## 2016-06-02 DIAGNOSIS — R3915 Urgency of urination: Secondary | ICD-10-CM | POA: Diagnosis not present

## 2016-06-02 DIAGNOSIS — G894 Chronic pain syndrome: Secondary | ICD-10-CM | POA: Diagnosis not present

## 2016-06-02 DIAGNOSIS — M62830 Muscle spasm of back: Secondary | ICD-10-CM | POA: Diagnosis not present

## 2016-06-02 DIAGNOSIS — C679 Malignant neoplasm of bladder, unspecified: Secondary | ICD-10-CM | POA: Diagnosis not present

## 2016-06-02 DIAGNOSIS — M47817 Spondylosis without myelopathy or radiculopathy, lumbosacral region: Secondary | ICD-10-CM | POA: Diagnosis not present

## 2016-06-02 DIAGNOSIS — Z5111 Encounter for antineoplastic chemotherapy: Secondary | ICD-10-CM | POA: Diagnosis not present

## 2016-06-02 DIAGNOSIS — C678 Malignant neoplasm of overlapping sites of bladder: Secondary | ICD-10-CM | POA: Diagnosis not present

## 2016-06-02 DIAGNOSIS — M8000XS Age-related osteoporosis with current pathological fracture, unspecified site, sequela: Secondary | ICD-10-CM | POA: Diagnosis not present

## 2016-06-09 DIAGNOSIS — Z5111 Encounter for antineoplastic chemotherapy: Secondary | ICD-10-CM | POA: Diagnosis not present

## 2016-06-09 DIAGNOSIS — C678 Malignant neoplasm of overlapping sites of bladder: Secondary | ICD-10-CM | POA: Diagnosis not present

## 2016-06-10 DIAGNOSIS — M79673 Pain in unspecified foot: Secondary | ICD-10-CM | POA: Diagnosis not present

## 2016-06-11 DIAGNOSIS — M47817 Spondylosis without myelopathy or radiculopathy, lumbosacral region: Secondary | ICD-10-CM | POA: Diagnosis not present

## 2016-06-14 DIAGNOSIS — Z79899 Other long term (current) drug therapy: Secondary | ICD-10-CM | POA: Diagnosis not present

## 2016-06-14 DIAGNOSIS — M810 Age-related osteoporosis without current pathological fracture: Secondary | ICD-10-CM | POA: Diagnosis not present

## 2016-06-14 DIAGNOSIS — M0579 Rheumatoid arthritis with rheumatoid factor of multiple sites without organ or systems involvement: Secondary | ICD-10-CM | POA: Diagnosis not present

## 2016-06-14 DIAGNOSIS — M150 Primary generalized (osteo)arthritis: Secondary | ICD-10-CM | POA: Diagnosis not present

## 2016-06-14 DIAGNOSIS — E663 Overweight: Secondary | ICD-10-CM | POA: Diagnosis not present

## 2016-06-14 DIAGNOSIS — S32000D Wedge compression fracture of unspecified lumbar vertebra, subsequent encounter for fracture with routine healing: Secondary | ICD-10-CM | POA: Diagnosis not present

## 2016-06-14 DIAGNOSIS — M25561 Pain in right knee: Secondary | ICD-10-CM | POA: Diagnosis not present

## 2016-06-14 DIAGNOSIS — Z6827 Body mass index (BMI) 27.0-27.9, adult: Secondary | ICD-10-CM | POA: Diagnosis not present

## 2016-06-30 DIAGNOSIS — Z947 Corneal transplant status: Secondary | ICD-10-CM | POA: Diagnosis not present

## 2016-06-30 DIAGNOSIS — H2513 Age-related nuclear cataract, bilateral: Secondary | ICD-10-CM | POA: Diagnosis not present

## 2016-07-01 DIAGNOSIS — M8000XS Age-related osteoporosis with current pathological fracture, unspecified site, sequela: Secondary | ICD-10-CM | POA: Diagnosis not present

## 2016-07-01 DIAGNOSIS — G894 Chronic pain syndrome: Secondary | ICD-10-CM | POA: Diagnosis not present

## 2016-07-01 DIAGNOSIS — M62830 Muscle spasm of back: Secondary | ICD-10-CM | POA: Diagnosis not present

## 2016-07-01 DIAGNOSIS — M47817 Spondylosis without myelopathy or radiculopathy, lumbosacral region: Secondary | ICD-10-CM | POA: Diagnosis not present

## 2016-07-14 DIAGNOSIS — M150 Primary generalized (osteo)arthritis: Secondary | ICD-10-CM | POA: Diagnosis not present

## 2016-07-22 DIAGNOSIS — N3281 Overactive bladder: Secondary | ICD-10-CM | POA: Diagnosis not present

## 2016-08-05 ENCOUNTER — Ambulatory Visit: Payer: Medicare Other | Admitting: Neurology

## 2016-08-05 DIAGNOSIS — M47817 Spondylosis without myelopathy or radiculopathy, lumbosacral region: Secondary | ICD-10-CM | POA: Diagnosis not present

## 2016-08-09 ENCOUNTER — Ambulatory Visit: Payer: Medicare Other | Attending: Endocrinology | Admitting: Physical Therapy

## 2016-08-09 DIAGNOSIS — M6281 Muscle weakness (generalized): Secondary | ICD-10-CM | POA: Insufficient documentation

## 2016-08-09 DIAGNOSIS — R2689 Other abnormalities of gait and mobility: Secondary | ICD-10-CM | POA: Insufficient documentation

## 2016-08-09 DIAGNOSIS — R2681 Unsteadiness on feet: Secondary | ICD-10-CM | POA: Diagnosis not present

## 2016-08-09 NOTE — Therapy (Signed)
[facility] [address] [place], [place], [address] Phone: [phone]   Fax:  [phone]  Physical Therapy Evaluation  Patient Details  Name: Tara Ingram MRN: [id number] Date of Birth: [birth year] Referring Provider: Dr. Anda Kraft (will be seeing Dr. Shelia Media)  Encounter Date: 08/09/2016      PT End of Session - 08/09/16 1906    Visit Number 1   Number of Visits 17   Date for PT Re-Evaluation 10/08/16   Authorization Type Medicare   Authorization Time Period 08-09-16 - 10-08-16   PT Start Time 3151   PT Stop Time 1450   PT Time Calculation (min) 47 min      Past Medical History:  Diagnosis Date  . Allergy   . Arthritis    hands/feet/back ra  . Cataract    bilateral  . GERD (gastroesophageal reflux disease)   . Headache   . History of kidney stones 50 yrs ago  . Hyperlipidemia   . Hypertension   . Sleep apnea    uses CPAP every night setting of 12    Past Surgical History:  Procedure Laterality Date  . APPENDECTOMY    . BACK SURGERY  05/2014   lower back for crushed disc  . CHOLECYSTECTOMY    . COLONOSCOPY  12/2011   hx polyps/jacobs  . CORNEAL TRANSPLANT     bilateral  . CYSTOSCOPY W/ RETROGRADES Bilateral 09/18/2015   Procedure: BILATERAL RETROGRADE PYELOGRAM;  Surgeon: Ardis Hughs, MD;  Location: [facility];  Service: Urology;  Laterality: Bilateral;  . CYSTOSCOPY W/ RETROGRADES Right 10/17/2015   Procedure: CYSTOSCOPY WITH RETROGRADE PYELOGRAM;  Surgeon: Ardis Hughs, MD;  Location: [facility];  Service: Urology;  Laterality: Right;  . EYE SURGERY Bilateral    lens replacement for cataract  . FOOT SURGERY Left 6 yrs ago   tammer toe reconstruction  . PARTIAL HYSTERECTOMY    . TONSILLECTOMY    . TRANSURETHRAL RESECTION OF BLADDER TUMOR N/A 09/18/2015   Procedure: TRANSURETHRAL RESECTION OF BLADDER TUMOR (TURBT);  Surgeon: Ardis Hughs, MD;  Location: [facility];  Service: Urology;   Laterality: N/A;  . TRANSURETHRAL RESECTION OF BLADDER TUMOR N/A 10/17/2015   Procedure: TRANSURETHRAL RESECTION OF BLADDER TUMOR (TURBT);  Surgeon: Ardis Hughs, MD;  Location: [facility];  Service: Urology;  Laterality: N/A;    There were no vitals filed for this visit.       Subjective Assessment - 08/09/16 1413    Subjective Pt reports she fell in Apri 2016 and has had difficulty walking since that fall; has used a single point cane since that time; pt states that her walking pattern (taking very small steps started after that fall); pt states she has appt with Dr. Jaynee Eagles tomorrow   Pertinent History Bladder cancer Aug. 2017; compression fracture in April 2016 due to fall with surgery   Patient Stated Goals Improve walking - "I want to walk right"   Currently in Pain? Yes   Pain Score 5    Pain Location Back   Pain Orientation Lower   Pain Descriptors / Indicators Aching   Pain Type Chronic pain   Pain Onset More than a month ago   Pain Frequency Intermittent   Aggravating Factors  standing or walking makes it worse   Pain Relieving Factors sitting or lying down   Multiple Pain Sites No            [facility] PT Assessment - 08/09/16 1418  Assessment   Medical Diagnosis Gait disorder   Referring Provider Dr. Anda Kraft  will be seeing Dr. Shelia Media   Onset Date/Surgical Date --  April 2016     Restrictions   Weight Bearing Restrictions No     Balance Screen   Has the patient fallen in the past 6 months No   Has the patient had a decrease in activity level because of a fear of falling?  Yes   Is the patient reluctant to leave their home because of a fear of falling?  No     Home Environment   Living Environment Private residence   Type of [place] to enter   Entrance Stairs-Number of Steps 3   Entrance Stairs-Rails Right   Home Layout Two level;Able to live on main level with bedroom/bathroom     Prior Function   Level of Independence  Independent     ROM / Strength   AROM / PROM / Strength Strength     Strength   Overall Strength Comments Pt has Lt knee pain with resisted hip flexion and with resisted knee extension;  bil. LE strength is WFL's     Transfers   Transfers Sit to Stand   Sit to Stand With upper extremity assist;6: Modified independent (Device/Increase time)   Comments Lt knee pain due to OA- crepitus heard with LLE AROM     Ambulation/Gait   Ambulation/Gait Yes   Ambulation/Gait Assistance 6: Modified independent (Device/Increase time)   Ambulation Distance (Feet) 100 Feet   Assistive device Straight cane   Gait Pattern Decreased stride length;Decreased step length - right;Decreased step length - left;Decreased dorsiflexion - right;Decreased dorsiflexion - left;Shuffle;Decreased trunk rotation   Ambulation Surface Level;Indoor   Gait velocity 27.62 = 1.19 ft/sec  with [facility]     Standardized Balance Assessment   Standardized Balance Assessment Berg Balance Test;Timed Up and Go Test     Berg Balance Test   Sit to Stand Able to stand  independently using hands   Standing Unsupported Able to stand safely 2 minutes   Sitting with Back Unsupported but Feet Supported on Floor or Stool Able to sit safely and securely 2 minutes   Stand to Sit Controls descent by using hands   Transfers Able to transfer safely, definite need of hands   Standing Unsupported with Eyes Closed Able to stand 10 seconds safely   Standing Ubsupported with Feet Together Able to place feet together independently and stand 1 minute safely   From Standing, Reach Forward with Outstretched Arm Can reach forward >12 cm safely (5")   From Standing Position, Pick up Object from Floor Able to pick up shoe safely and easily   From Standing Position, Turn to Look Behind Over each Shoulder Turn sideways only but maintains balance   Turn 360 Degrees Able to turn 360 degrees safely but slowly   Standing Unsupported, Alternately Place Feet on  Step/Stool Able to complete >2 steps/needs minimal assist   Standing Unsupported, One Foot in Front Able to take small step independently and hold 30 seconds   Standing on One Leg Tries to lift leg/unable to hold 3 seconds but remains standing independently   Total Score 40     Timed Up and Go Test   Normal TUG (seconds) 29.02  with SPC            Objective measurements completed on examination: See above findings.  PT Short Term Goals - 08/09/16 1946      PT SHORT TERM GOAL #1   Title Improve TUG score to </= 25 secs with SPC for improved functional mobility/reduced fall risk.  09-09-16   Baseline 29.02 secs with SPC   Time 4   Period Weeks   Status New     PT SHORT TERM GOAL #2   Title Incr. gait velocity to >/= 1.4 ft/sec with SPC for incr. gait efficiency.  09-09-16   Baseline 27.62 secs = 1.19 ft/sec   Time 4   Period Weeks   Status New     PT SHORT TERM GOAL #3   Title Improve Berg score to >/= 44/56 to decr. fall risk.  09-09-16   Baseline 40/56    Time 4   Period Weeks   Status New     PT SHORT TERM GOAL #4   Title Amb. 350' with rollator with SBA on flat, even surface.  09-09-16   Time 4   Period Weeks   Status New     PT SHORT TERM GOAL #5   Title Independent in HEP for balance and strengthening.  09-09-16   Time 4   Period Weeks   Status New           PT Long Term Goals - 08/09/16 2024      PT LONG TERM GOAL #1   Title Increase Berg balance test score from 40/56 to >/= 48/56 to decr. fall risk.  10-08-16   Time 8   Period Weeks   Status New     PT LONG TERM GOAL #2   Title Improve TUG score to </= 21 secs with SPC for reduced fall risk.  10-08-16   Baseline 29.02 secs with SPC   Time 8   Period Weeks   Status New     PT LONG TERM GOAL #3   Title Incr. gait velocity from 1.19 to >/= 1.7 ft/sec with SPC for incr. gait efficiency.  10-08-16   Time 8   Period Weeks   Status New     PT LONG TERM GOAL #4   Title  Amb. 500' with rollator with SBA on flat, even surface.  10-08-16   Time 8   Period Weeks   Status New     PT LONG TERM GOAL #5   Title Independent in updated HEP.  10-08-16   Time 8   Period Weeks   Status New                Plan - 08/09/16 1911    Clinical Impression Statement Pt is a 71 yr old lady with gait abnormality which she reports has progressively worsened since onset which started after a fall sustained in April 2016, in which she sustained a lumbar compression fracture.  Pt is using a single point cane and exhibits shuffling gait pattern with significantly decreased step and stride length.  Pt also presents with balance deficits and mild hip musculature weakness with c/o left knee pain due to OA.  History and Personal Factors relevant to plan of care: compression fracture (lumbar) due to fall in April 2016:  h/o bladder cancer in August 2017; OA:  OSA   Clinical Presentation Evolving   Clinical Presentation due to: gait disorder (unknown exact etiology as pt has appt with neurologist on 08-10-16); parkinsonism type gait   Clinical Decision Making Moderate   Rehab Potential Good   PT Frequency 2x / week   PT Duration 8 weeks   PT Treatment/Interventions ADLs/Self Care Home Management;DME Instruction;Gait training;Stair training;Functional mobility training;Therapeutic activities;Therapeutic exercise;Balance training;Neuromuscular re-education;Patient/family education   PT Next Visit Plan balance HEP; PWR exercises in standing; gait training with rollator   PT Home Exercise Plan see above   Consulted and Agree with Plan of Care Patient      Patient will benefit from skilled therapeutic intervention in order to improve the following deficits and impairments:  Abnormal gait, Decreased balance, Decreased knowledge of use of DME, Decreased strength, Pain  Visit  Diagnosis: Other abnormalities of gait and mobility - Plan: PT plan of care cert/re-cert  Unsteadiness on feet - Plan: PT plan of care cert/re-cert  Muscle weakness (generalized) - Plan: PT plan of care cert/re-cert      G-Codes - 28/00/34 2040    Functional Assessment Tool Used (Outpatient Only) Berg score 40/56:  TUG score 29.02 secs:  gait velocity 1.19 ft/sec with SPC   Functional Limitation Mobility: Walking and moving around   Mobility: Walking and Moving Around Current Status [phone]) At least 40 percent but less than 60 percent impaired, limited or restricted   Mobility: Walking and Moving Around Goal Status [phone]) At least 20 percent but less than 40 percent impaired, limited or restricted       Problem List Patient Active Problem List   Diagnosis Date Noted  . Malignant tumor of trigone of bladder ([place]) 09/18/2015  . OSA (obstructive sleep apnea) 12/30/2014  . Closed wedge compression fracture of second lumbar vertebra ([place])   . DIARRHEA 04/17/2010    Tara Ingram, PT 08/09/2016, 8:46 PM  [facility] [address] [place], [place], [address] Phone: [phone]   Fax:  [phone]  Name: Tara Ingram MRN: [id number] Date of Birth: [birth year]

## 2016-08-10 ENCOUNTER — Encounter: Payer: Self-pay | Admitting: Neurology

## 2016-08-10 ENCOUNTER — Ambulatory Visit (INDEPENDENT_AMBULATORY_CARE_PROVIDER_SITE_OTHER): Payer: Medicare Other | Admitting: Neurology

## 2016-08-10 VITALS — BP 136/76 | HR 68 | Ht 63.5 in | Wt 160.0 lb

## 2016-08-10 DIAGNOSIS — G20 Parkinson's disease: Secondary | ICD-10-CM

## 2016-08-10 NOTE — Progress Notes (Signed)
[facility]    Provider:  Dr Jaynee Eagles Referring Provider: Anda Kraft, MD Primary Care Physician:  Anda Kraft, MD  CC:  Shuffling gait  HPI:  Tara Ingram is a 71 y.o. female here as a referral from Dr. Wilson Singer for shuffling gait. Here with her husband who also provides information.  Past medical history of sleep apnea, hypertension, hyperlipidemia, kidney stones, headache, GERD, bladder cancer, arthritis, allergy. Patient fell in 2016 and has not gotten over it since she fell, she fell on her deck and crushed a vertebrae in her back and since then she has been walking differently. Walking has about the same, not better. Her walking is slow. She has chronic back pain. She shuffles, moves the left leg forward and drags the other one. She has pain when she walks and she drags the right foot. She has pain when standing in her back. Before the fall she was walking fine and after the fall her gait changed. She has postural tremors that she has had for years. No resting tremor. Volume of voice not significantly changed but speaks more softly. She denies any dreams or trouble with sleeping.No vivid dreams. No wet pillows or drooling. She has decreased smell for 4-5 years. No urinary incontinence but has difficulty since bladder cancer. She has no difficulty swallowing, handwriting has worsened not small but messy. Denies hallucinations. Mother, grandmother and sister has Parkinson's disease. Feels imbalanced.     Reviewed notes, labs and imaging from outside physicians, which showed:   Non-compressive disc bulges at T10-11, T11-12 and T12-L1.  L1-2: Bulging of the disc. Mild posterior bowing of the posterosuperior endplate of L2 related to the old fracture. Mild stenosis of both lateral recesses at this level without definite neural compression. No evidence of un healed component at the L2 level.  L2-3: Bulging of the disc. Bilateral facet arthropathy. No central canal  stenosis. Facet arthropathy could be symptomatic. Mild foraminal stenosis on the left.  L3-4: Mild bulging of the disc. Mild facet hypertrophy. No compressive stenosis.  L4-5: Advanced bilateral facet arthropathy with anterolisthesis of 7 mm. Mild bulging of the disc. Narrowing of the lateral recesses that could cause neural compression. Facet arthropathy on the left shows edema and enhancement which could be symptomatic.  L5-S1: Advanced bilateral facet arthropathy with anterolisthesis of 6 mm. Bulging of the disc. Mild stenosis of the subarticular lateral recesses and neural foramina without visible neural compression.Facet edema and enhancement right more than left which could be symptomatic.  No evidence of sacral fracture down through S3.  IMPRESSION: Old healed augmented fracture at L2. No evidence of progression or residual edema.  Degenerative facet arthritis at L2-3 left more than right probably because of the altered biomechanics. This could be a cause of left-sided back pain. Some left foraminal narrowing could possibly affect the L2 nerve root.  Facet arthropathy at L4-5 with 7 mm of anterolisthesis. Stenosis of both lateral recesses. Facet edema and enhancement on the left which could be associated with pain.  Facet arthropathy at L5-S1 with 6 mm of anterolisthesis. Mild subarticular lateral recess narrowing bilaterally. Facet edema and enhancement right more than left could be associated with low back pain.  Review of labs from [facility] which included unremarkable CBC, unremarkable CMP with BUN 14 and creatinine 0.8 both labs were drawn 10/16/2015, TSH 4.28  Review of Systems: Patient complains of symptoms per HPI as well as the following symptoms: memory loss, headache. Pertinent negatives and positives per HPI.  All others negative.   Social History   Social History  . Marital status: Married    Spouse name: N/A  . Number of children: 0    . Years of education: 12   Occupational History  . Retired    Social History Main Topics  . Smoking status: Never Smoker  . Smokeless tobacco: Never Used  . Alcohol use No  . Drug use: No  . Sexual activity: Yes    Birth control/ protection: Surgical     Comment: hysterectomy   Other Topics Concern  . Not on file   Social History Narrative   Lives at home w/ her husband   Right-handed   Caffeine: 1+ glasses of tea per day    Family History  Problem Relation Age of Onset  . Colon cancer Sister 74  . Allergies Mother   . Heart disease Mother   . Rheumatologic disease Mother   . Allergies Father   . Asthma Father   . Heart disease Father   . Esophageal cancer Neg Hx   . Colon polyps Neg Hx   . Rectal cancer Neg Hx   . Stomach cancer Neg Hx     Past Medical History:  Diagnosis Date  . Allergy   . Arthritis    hands/feet/back ra  . Bladder cancer ([place])   . Cataract    bilateral  . GERD (gastroesophageal reflux disease)   . Headache   . History of kidney stones 50 yrs ago  . Hyperlipidemia   . Hypertension   . Sleep apnea    uses CPAP every night setting of 12    Past Surgical History:  Procedure Laterality Date  . APPENDECTOMY    . BACK SURGERY  05/2014   lower back for crushed disc  . CHOLECYSTECTOMY    . COLONOSCOPY  12/2011   hx polyps/jacobs  . CORNEAL TRANSPLANT     bilateral  . CYSTOSCOPY W/ RETROGRADES Bilateral 09/18/2015   Procedure: BILATERAL RETROGRADE PYELOGRAM;  Surgeon: Ardis Hughs, MD;  [facility];  Service: Urology;  Laterality: Bilateral;  . CYSTOSCOPY W/ RETROGRADES Right 10/17/2015   Procedure: CYSTOSCOPY WITH RETROGRADE PYELOGRAM;  Surgeon: Ardis Hughs, MD;  Location: [facility];  Service: Urology;  Laterality: Right;  . EYE SURGERY Bilateral    lens replacement for cataract  . FOOT SURGERY Left 6 yrs ago   tammer toe reconstruction  . PARTIAL HYSTERECTOMY    . TONSILLECTOMY    . TRANSURETHRAL RESECTION OF  BLADDER TUMOR N/A 09/18/2015   Procedure: TRANSURETHRAL RESECTION OF BLADDER TUMOR (TURBT);  Surgeon: Ardis Hughs, MD;  Location: [facility];  Service: Urology;  Laterality: N/A;  . TRANSURETHRAL RESECTION OF BLADDER TUMOR N/A 10/17/2015   Procedure: TRANSURETHRAL RESECTION OF BLADDER TUMOR (TURBT);  Surgeon: Ardis Hughs, MD;  Location: [facility];  Service: Urology;  Laterality: N/A;    Current Outpatient Prescriptions  Medication Sig Dispense Refill  . celecoxib (CELEBREX) 200 MG capsule Take 200 mg by mouth as needed.   2  . cholestyramine (QUESTRAN) 4 g packet MIX 1 PACKET (4 GM TOTAL) AND TAKE BY MOUTH TWICE DAILY. (Patient taking differently: MIX 1 PACKET (4 GM TOTAL) AND TAKE BY MOUTH DAILY.) 60 each 11  . diclofenac sodium (VOLTAREN) 1 % GEL Apply 1 application topically 2 (two) times daily.   5  . diphenhydrAMINE (BENADRYL) 25 MG tablet Take 25 mg by mouth 3 (three) times daily as needed for itching or allergies.     Marland Kitchen  HYDROcodone-acetaminophen (NORCO) 10-325 MG per tablet Take 1 tablet by mouth every 6 (six) hours as needed for moderate pain.    Marland Kitchen leflunomide (ARAVA) 20 MG tablet Take 20 mg by mouth daily.    . Meth-Hyo-M Bl-Na Phos-Ph Sal (URIBEL) 118 MG CAPS Take 1 capsule (118 mg total) by mouth 4 (four) times daily as needed. 30 capsule 1  . metoCLOPramide (REGLAN) 5 MG tablet Take 2-3 times daily as needed for nausea. (Patient taking differently: Take 5 mg by mouth 3 (three) times daily as needed for nausea or vomiting. Take 2-3 times daily as needed for nausea.) 50 tablet 3  . mirabegron ER (MYRBETRIQ) 50 MG TB24 tablet Take 50 mg by mouth daily.    Earney Navy Bicarbonate (ZEGERID OTC PO) Take 1 capsule by mouth daily as needed (heartburn/acid reflux). Reported on 04/08/2015    . oxyCODONE-acetaminophen (PERCOCET/ROXICET) 5-325 MG tablet   0  . tiZANidine (ZANAFLEX) 2 MG tablet Take 2 mg by mouth at bedtime.   2  . valsartan (DIOVAN) 320 MG tablet Take 320 mg by mouth  daily.     No current facility-administered medications for this visit.     Allergies as of 08/10/2016  . (No Known Allergies)    Vitals: BP 136/76   Pulse 68   Ht 5' 3.5" (1.613 m)   Wt 160 lb (72.6 kg)   BMI 27.90 kg/m  Last Weight:  Wt Readings from Last 1 Encounters:  08/10/16 160 lb (72.6 kg)   Last Height:   Ht Readings from Last 1 Encounters:  08/10/16 5' 3.5" (1.613 m)   Physical exam: Exam: Gen: NAD, conversant, well nourised, well groomed                     CV: RRR, no MRG. No Carotid Bruits. No peripheral edema, warm, nontender Eyes: Conjunctivae clear without exudates or hemorrhage  Neuro: Detailed Neurologic Exam  Speech:    Speech is normal; fluent and spontaneous with normal comprehension.  Cognition:    The patient is oriented to person, place, and time;     recent and remote memory intact;     language fluent;     normal attention, concentration,     fund of knowledge Cranial Nerves: Hypomimia    The pupils are equal, round, and reactive to light. Attempted fundoscopic exam could not visualize. Visual fields are full to finger confrontation. Extraocular movements are intact. Trigeminal sensation is intact and the muscles of mastication are normal. The face is symmetric. The palate elevates in the midline. Hearing intact. Voice is normal. Shoulder shrug is normal. The tongue has normal motion without fasciculations.   Coordination:    No dysmetria. Mild bradykinesia on finger taps and dec amplitude.  Gait:    Shuffling, reduced arm swing, reemergent tremor right hand  Motor Observation:    No asymmetry, no atrophy, and no involuntary movements noted. Tone:    cogwheeling on the right enhanced with facilitation   Posture:    Posture is normal. normal erect    Strength: mild proximal weakness otherwise strength is V/V in the upper and lower limbs.      Sensation: intact to LT     Reflex Exam:  DTR's:    Deep tendon reflexes in the upper  and lower extremities are brisk bilaterally.   Toes:    The toes are equivocal bilaterally.   Clonus:    Clonus is absent.       Assessment/Plan:  71 year old with parkinsonism. Exam shows shuffling gait, reduced arm swing, re-emergent tremor of the right hand, cogwheeling increased tone of the right upper extremity, brisk reflexes, hypomimia. Patient reports hypophonia and loss of smell. Concerning for Parkinson's disease however patient had a significant fall several years ago with fracture of the spine and reports her shuffling gait started immediately afterwards which is not consistent with Parkinson's disease so need further workup including MRI brain and possibly DAT scan.   MRI brain w/wo contrast DAT Scan Discussed fall precautions, she is starting physical therapy next week.  Orders Placed This Encounter  Procedures  . MR BRAIN WO CONTRAST   Cc: Dr. Ronnette Hila, MD  [facility] [address] [place], [place] [address]  Phone [phone] Fax [phone]

## 2016-08-10 NOTE — Patient Instructions (Signed)
Remember to drink plenty of fluid, eat healthy meals and do not skip any meals. Try to eat protein with a every meal and eat a healthy snack such as fruit or nuts in between meals. Try to keep a regular sleep-wake schedule and try to exercise daily, particularly in the form of walking, 20-30 minutes a day, if you can.   As far as diagnostic testing: MRI of the brain and DAT scan  I would like to see you back in 4 months, sooner if we need to. Please call us with any interim questions, concerns, problems, updates or refill requests.   Our phone number is [phone]. We also have an after hours call service for urgent matters and there is a physician on-call for urgent questions. For any emergencies you know to call 911 or go to the nearest emergency room   Parkinson Disease Parkinson disease is a long-term (chronic) condition. It gets worse over time (is progressive). Parkinson disease limits your ability:  To control how your body moves.  To move your body normally.  This condition affects each person differently. The condition can range from mild to very bad. This condition tends to progress slowly over many years. Follow these instructions at home:  Take over-the-counter and prescription medicines only as told by your doctor.  Put grab bars and railings in your home. These help to prevent falls.  Follow instructions from your doctor about what you can or cannot eat or drink.  Go back to your normal activities as told by your doctor. Ask your doctor what activities are safe for you.  Exercise as told by your doctor or physical therapist.  Keep all follow-up visits as told by your doctor. This is important. These include any visits with a speech or occupational therapist.  Think about joining a support group for people who have Parkinson disease. Contact a doctor if:  Medicines do not help your symptoms.  You feel off-balance.  You fall at home.  You need more help at  home.  You have: ? Trouble swallowing. ? A very hard time pooping (constipation). ? A lot of side effects from your medicines.  You see or hear things that are not real (hallucinate).  You feel: ? Confused. ? Anxious. ? Sad (depressed). Get help right away if:  You were hurt in a fall.  You cannot swallow without choking.  You have chest pain.  You have trouble breathing.  You do not feel safe at home. This information is not intended to replace advice given to you by your health care provider. Make sure you discuss any questions you have with your health care provider. Document Released: 04/12/2011 Document Revised: 06/26/2015 Document Reviewed: 11/08/2014 Elsevier Interactive Patient Education  [employer].

## 2016-08-31 ENCOUNTER — Ambulatory Visit: Payer: Medicare Other | Admitting: Physical Therapy

## 2016-08-31 DIAGNOSIS — R2681 Unsteadiness on feet: Secondary | ICD-10-CM | POA: Diagnosis not present

## 2016-08-31 DIAGNOSIS — M6281 Muscle weakness (generalized): Secondary | ICD-10-CM | POA: Diagnosis not present

## 2016-08-31 DIAGNOSIS — R2689 Other abnormalities of gait and mobility: Secondary | ICD-10-CM

## 2016-08-31 NOTE — Therapy (Addendum)
[facility] [address] [place], [place], [address] Phone: [phone]   Fax:  [phone]  Physical Therapy Treatment  Patient Details  Name: Tara Ingram MRN: [id number] Date of Birth: [birth year] Referring Provider: Dr. Anda Kraft (will be seeing Dr. Shelia Media)  Encounter Date: 08/31/2016      PT End of Session - 08/31/16 2128    Visit Number 2   Number of Visits 17   Date for PT Re-Evaluation 10/08/16   Authorization Type Medicare   Authorization Time Period 08-09-16 - 10-08-16   PT Start Time 3545   PT Stop Time 1403   PT Time Calculation (min) 46 min      Past Medical History:  Diagnosis Date  . Allergy   . Arthritis    hands/feet/back ra  . Bladder cancer ([place])   . Cataract    bilateral  . GERD (gastroesophageal reflux disease)   . Headache   . History of kidney stones 50 yrs ago  . Hyperlipidemia   . Hypertension   . Sleep apnea    uses CPAP every night setting of 12    Past Surgical History:  Procedure Laterality Date  . APPENDECTOMY    . BACK SURGERY  05/2014   lower back for crushed disc  . CHOLECYSTECTOMY    . COLONOSCOPY  12/2011   hx polyps/jacobs  . CORNEAL TRANSPLANT     bilateral  . CYSTOSCOPY W/ RETROGRADES Bilateral 09/18/2015   Procedure: BILATERAL RETROGRADE PYELOGRAM;  Surgeon: Ardis Hughs, MD;  Location: [facility];  Service: Urology;  Laterality: Bilateral;  . CYSTOSCOPY W/ RETROGRADES Right 10/17/2015   Procedure: CYSTOSCOPY WITH RETROGRADE PYELOGRAM;  Surgeon: Ardis Hughs, MD;  [facility];  Service: Urology;  Laterality: Right;  . EYE SURGERY Bilateral    lens replacement for cataract  . FOOT SURGERY Left 6 yrs ago   tammer toe reconstruction  . PARTIAL HYSTERECTOMY    . TONSILLECTOMY    . TRANSURETHRAL RESECTION OF BLADDER TUMOR N/A 09/18/2015   Procedure: TRANSURETHRAL RESECTION OF BLADDER TUMOR (TURBT);  Surgeon: Ardis Hughs, MD;  [facility];  Service: Urology;  Laterality: N/A;  . TRANSURETHRAL RESECTION OF BLADDER TUMOR N/A 10/17/2015   Procedure: TRANSURETHRAL RESECTION OF BLADDER TUMOR (TURBT);  Surgeon: Ardis Hughs, MD;  [facility];  Service: Urology;  Laterality: N/A;    There were no vitals filed for this visit.      Subjective Assessment - 09/01/16 1716    Subjective Saw Dr. Jaynee Eagles on 08-10-16 - states she is going to have MRI and DAT test for further testing to see if she has Parkinson's   Pertinent History Bladder cancer Aug. 2017; compression fracture in April 2016 due to fall with surgery   Patient Stated Goals Improve walking - "I want to walk right"   Currently in Pain? Yes   Pain Score 4    Pain Location Buttocks   Pain Orientation Right;Left   Pain Descriptors / Indicators Dull   Pain Type Chronic pain   Pain Onset 1 to 4 weeks ago   Pain Frequency Constant                         [facility] Adult PT Treatment/Exercise - 09/01/16 0001      Ambulation/Gait   Ambulation/Gait Yes   Ambulation/Gait Assistance 5: Supervision   Ambulation/Gait Assistance Details cues to stay close to RW and to  increase step length   Ambulation Distance (Feet) 230 Feet  115   Assistive device 4-wheeled walker   Gait Pattern Decreased stride length;Decreased step length - right;Decreased step length - left;Decreased dorsiflexion - right;Decreased dorsiflexion - left;Shuffle;Decreased trunk rotation   Ambulation Surface Level;Indoor     High Level Balance   High Level Balance Activities Marching forwards;Other (comment)  stepping over and back of orange hurdle 10 reps each foot      Exercises   Exercises Knee/Hip     Knee/Hip Exercises: Stretches   Active Hamstring Stretch Both;1 rep;30 seconds           PWR [facility]) - 09/01/16 1705    PWR! exercises Moves in standing   PWR Step 10 reps         LSVT [facility]) - 09/01/16 1703    Side to side 10 reps  forward, back and side directions             PT Education - 09/01/16 1713    Education provided Yes   Education Details PWR! stepping exercises - stepping forward, back and side x 10 reps   Person(s) Educated Patient   Methods Handout;Explanation   Comprehension Verbalized understanding;Returned demonstration          PT Short Term Goals - 08/09/16 1946      PT SHORT TERM GOAL #1   Title Improve TUG score to </= 25 secs with SPC for improved functional mobility/reduced fall risk.  09-09-16   Baseline 29.02 secs with SPC   Time 4   Period Weeks   Status New     PT SHORT TERM GOAL #2   Title Incr. gait velocity to >/= 1.4 ft/sec with SPC for incr. gait efficiency.  09-09-16   Baseline 27.62 secs = 1.19 ft/sec   Time 4   Period Weeks   Status New     PT SHORT TERM GOAL #3   Title Improve Berg score to >/= 44/56 to decr. fall risk.  09-09-16   Baseline 40/56    Time 4   Period Weeks   Status New     PT SHORT TERM GOAL #4   Title Amb. 350' with rollator with SBA on flat, even surface.  09-09-16   Time 4   Period Weeks   Status New     PT SHORT TERM GOAL #5   Title Independent in HEP for balance and strengthening.  09-09-16   Time 4   Period Weeks   Status New           PT Long Term Goals - 08/09/16 2024      PT LONG TERM GOAL #1   Title Increase Berg balance test score from 40/56 to >/= 48/56 to decr. fall risk.  10-08-16   Time 8   Period Weeks   Status New     PT LONG TERM GOAL #2   Title Improve TUG score to </= 21 secs with SPC for reduced fall risk.  10-08-16   Baseline 29.02 secs with SPC   Time 8   Period Weeks   Status New     PT LONG TERM GOAL #3   Title Incr. gait velocity from 1.19 to >/= 1.7 ft/sec with SPC for incr. gait efficiency.  10-08-16   Time 8   Period Weeks   Status New     PT LONG TERM GOAL #4   Title Amb. 500' with rollator with SBA on flat, even surface.  10-08-16  Time 8   Period Weeks   Status New     PT LONG TERM GOAL #5   Title Independent in updated HEP.   10-08-16   Time 8   Period Weeks   Status New             Patient will benefit from skilled therapeutic intervention in order to improve the following deficits and impairments:     Visit Diagnosis: Other abnormalities of gait and mobility  Unsteadiness on feet     Problem List Patient Active Problem List   Diagnosis Date Noted  . Malignant tumor of trigone of bladder ([place]) 09/18/2015  . OSA (obstructive sleep apnea) 12/30/2014  . Closed wedge compression fracture of second lumbar vertebra ([place])   . DIARRHEA 04/17/2010    Alda Lea, PT 09/01/2016, 5:16 PM  [facility] [address] [place], [place], [address] Phone: [phone]   Fax:  [phone]  Name: TIAJUANA LEPPANEN MRN: [id number] Date of Birth: [birth year]

## 2016-09-02 ENCOUNTER — Ambulatory Visit: Payer: Medicare Other | Admitting: Physical Therapy

## 2016-09-03 ENCOUNTER — Ambulatory Visit
Admission: RE | Admit: 2016-09-03 | Discharge: 2016-09-03 | Disposition: A | Discharge status: Home / Self Care | Payer: Medicare Other | Source: Ambulatory Visit | Attending: Neurology | Admitting: Neurology

## 2016-09-03 DIAGNOSIS — G20 Parkinson's disease: Secondary | ICD-10-CM | POA: Diagnosis not present

## 2016-09-06 ENCOUNTER — Ambulatory Visit: Payer: Medicare Other | Attending: Endocrinology | Admitting: Physical Therapy

## 2016-09-06 DIAGNOSIS — R2689 Other abnormalities of gait and mobility: Secondary | ICD-10-CM | POA: Diagnosis not present

## 2016-09-06 DIAGNOSIS — R2681 Unsteadiness on feet: Secondary | ICD-10-CM | POA: Insufficient documentation

## 2016-09-07 NOTE — Therapy (Signed)
[facility] [address] [place], [place], [address] Phone: [phone]   Fax:  [phone]  Physical Therapy Treatment  Patient Details  Name: Tara Ingram MRN: [id number] Date of Birth: [birth year] Referring Provider: Dr. Anda Kraft (will be seeing Dr. Shelia Media)  Encounter Date: 09/06/2016      PT End of Session - 09/07/16 1409    Visit Number 3  G3   Number of Visits 17   Date for PT Re-Evaluation 10/08/16   Authorization Type Medicare   Authorization Time Period 08-09-16 - 10-08-16   PT Start Time 1534   PT Stop Time 1621   PT Time Calculation (min) 47 min   Equipment Utilized During Treatment Gait belt      Past Medical History:  Diagnosis Date  . Allergy   . Arthritis    hands/feet/back ra  . Bladder cancer ([place])   . Cataract    bilateral  . GERD (gastroesophageal reflux disease)   . Headache   . History of kidney stones 50 yrs ago  . Hyperlipidemia   . Hypertension   . Sleep apnea    uses CPAP every night setting of 12    Past Surgical History:  Procedure Laterality Date  . APPENDECTOMY    . BACK SURGERY  05/2014   lower back for crushed disc  . CHOLECYSTECTOMY    . COLONOSCOPY  12/2011   hx polyps/jacobs  . CORNEAL TRANSPLANT     bilateral  . CYSTOSCOPY W/ RETROGRADES Bilateral 09/18/2015   Procedure: BILATERAL RETROGRADE PYELOGRAM;  Surgeon: Ardis Hughs, MD;  [facility];  Service: Urology;  Laterality: Bilateral;  . CYSTOSCOPY W/ RETROGRADES Right 10/17/2015   Procedure: CYSTOSCOPY WITH RETROGRADE PYELOGRAM;  Surgeon: Ardis Hughs, MD;  [facility];  Service: Urology;  Laterality: Right;  . EYE SURGERY Bilateral    lens replacement for cataract  . FOOT SURGERY Left 6 yrs ago   tammer toe reconstruction  . PARTIAL HYSTERECTOMY    . TONSILLECTOMY    . TRANSURETHRAL RESECTION OF BLADDER TUMOR N/A 09/18/2015   Procedure: TRANSURETHRAL RESECTION OF BLADDER TUMOR  (TURBT);  Surgeon: Ardis Hughs, MD;  [facility];  Service: Urology;  Laterality: N/A;  . TRANSURETHRAL RESECTION OF BLADDER TUMOR N/A 10/17/2015   Procedure: TRANSURETHRAL RESECTION OF BLADDER TUMOR (TURBT);  Surgeon: Ardis Hughs, MD;  [facility];  Service: Urology;  Laterality: N/A;    There were no vitals filed for this visit.      Subjective Assessment - 09/07/16 1404    Subjective Pt states she had MRI done on Friday - hasn't gotten results yet   Pertinent History Bladder cancer Aug. 2017; compression fracture in April 2016 due to fall with surgery   Patient Stated Goals Improve walking - "I want to walk right"   Currently in Pain? No/denies                         [facility] Adult PT Treatment/Exercise - 09/07/16 0001      Ambulation/Gait   Ambulation/Gait Yes   Ambulation/Gait Assistance 5: Supervision   Ambulation/Gait Assistance Details cues to stay close to RW and to increase step length   Ambulation Distance (Feet) 250 Feet   Assistive device Rolling walker   Gait Pattern Decreased stride length;Decreased step length - right;Decreased step length - left;Decreased dorsiflexion - right;Decreased dorsiflexion - left;Shuffle;Decreased trunk rotation   Ambulation Surface Level;Indoor  PWR [facility]) - 09/07/16 1408    PWR Step 10 reps each leg with forward, backward and sidestepping with UE support prn          Balance Exercises - 09/07/16 1406      Balance Exercises: Standing   SLS with Vectors Solid surface;5 reps;Other (comment)  with UE support   Sidestepping Upper extremity support;2 reps  inside // bars     Rockerboard anterior/posteriorly x 10 reps inside // bars with UE support Stepping up and back 10 reps alternating with UE support inside // bars        PT Short Term Goals - 08/09/16 1946      PT SHORT TERM GOAL #1   Title Improve TUG score to </= 25 secs with SPC for improved functional  mobility/reduced fall risk.  09-09-16   Baseline 29.02 secs with SPC   Time 4   Period Weeks   Status New     PT SHORT TERM GOAL #2   Title Incr. gait velocity to >/= 1.4 ft/sec with SPC for incr. gait efficiency.  09-09-16   Baseline 27.62 secs = 1.19 ft/sec   Time 4   Period Weeks   Status New     PT SHORT TERM GOAL #3   Title Improve Berg score to >/= 44/56 to decr. fall risk.  09-09-16   Baseline 40/56    Time 4   Period Weeks   Status New     PT SHORT TERM GOAL #4   Title Amb. 350' with rollator with SBA on flat, even surface.  09-09-16   Time 4   Period Weeks   Status New     PT SHORT TERM GOAL #5   Title Independent in HEP for balance and strengthening.  09-09-16   Time 4   Period Weeks   Status New           PT Long Term Goals - 08/09/16 2024      PT LONG TERM GOAL #1   Title Increase Berg balance test score from 40/56 to >/= 48/56 to decr. fall risk.  10-08-16   Time 8   Period Weeks   Status New     PT LONG TERM GOAL #2   Title Improve TUG score to </= 21 secs with SPC for reduced fall risk.  10-08-16   Baseline 29.02 secs with SPC   Time 8   Period Weeks   Status New     PT LONG TERM GOAL #3   Title Incr. gait velocity from 1.19 to >/= 1.7 ft/sec with SPC for incr. gait efficiency.  10-08-16   Time 8   Period Weeks   Status New     PT LONG TERM GOAL #4   Title Amb. 500' with rollator with SBA on flat, even surface.  10-08-16   Time 8   Period Weeks   Status New     PT LONG TERM GOAL #5   Title Independent in updated HEP.  10-08-16   Time 8   Period Weeks   Status New               Plan - 09/07/16 1411    Clinical Impression Statement Rollator adjusted to correct height for pt - recommend use of rollator rather than SPC for increased safety with gait and to facilitate more normal gait pattern with incr. step length:  pt able to increase step length with verbal cues, but unable to maintain normal step  length as she reverts ack to shuffling gait  pattern after approx. 20' distance with incr. step length   Rehab Potential Good   PT Frequency 2x / week   PT Duration 8 weeks   PT Treatment/Interventions ADLs/Self Care Home Management;DME Instruction;Gait training;Stair training;Functional mobility training;Therapeutic activities;Therapeutic exercise;Balance training;Neuromuscular re-education;Patient/family education   PT Next Visit Plan balance HEP; PWR exercises in standing; gait training with rollator   PT Home Exercise Plan see above   Consulted and Agree with Plan of Care Patient      Patient will benefit from skilled therapeutic intervention in order to improve the following deficits and impairments:  Abnormal gait, Decreased balance, Decreased knowledge of use of DME, Decreased strength, Pain  Visit Diagnosis: Other abnormalities of gait and mobility  Unsteadiness on feet     Problem List Patient Active Problem List   Diagnosis Date Noted  . Malignant tumor of trigone of bladder ([place]) 09/18/2015  . OSA (obstructive sleep apnea) 12/30/2014  . Closed wedge compression fracture of second lumbar vertebra ([place])   . DIARRHEA 04/17/2010    Alda Lea, PT 09/07/2016, 2:19 PM  [facility] [address] [place], [place], [address] Phone: [phone]   Fax:  [phone]  Name: Tara Ingram MRN: [id number] Date of Birth: 1945-08-19

## 2016-09-08 ENCOUNTER — Telehealth: Payer: Self-pay | Admitting: Neurology

## 2016-09-08 ENCOUNTER — Other Ambulatory Visit: Payer: Self-pay | Admitting: Neurology

## 2016-09-08 DIAGNOSIS — G20 Parkinson's disease: Secondary | ICD-10-CM

## 2016-09-08 NOTE — Telephone Encounter (Signed)
Tara Ingram, I also ordered a DAT scan for a patient with parkinsonism. These could be done a [employer]. This is a nuclear medicine study. FYI thanks for taking care of this, wanted to make sure you get these orders thanks!!  jen,fyi

## 2016-09-09 ENCOUNTER — Telehealth: Payer: Self-pay | Admitting: Neurology

## 2016-09-09 ENCOUNTER — Ambulatory Visit: Payer: Medicare Other | Admitting: Physical Therapy

## 2016-09-09 DIAGNOSIS — R2681 Unsteadiness on feet: Secondary | ICD-10-CM

## 2016-09-09 DIAGNOSIS — R2689 Other abnormalities of gait and mobility: Secondary | ICD-10-CM | POA: Diagnosis not present

## 2016-09-09 NOTE — Telephone Encounter (Signed)
IMPRESSION:  Abnormal  MRI scan of the brain showing mild ventriculomegaly out of proportion to the degree of cortical and central atrophy noted indicative of possible communicating hydrocephalus. Moderate changes of age-appropriate chronic microvascular ischemia and mild degree of generalized cerebral atrophy. No acute abnormality noted.  Pt is being scheduled for DAT scan.

## 2016-09-09 NOTE — Telephone Encounter (Signed)
Pt called request MRI results °

## 2016-09-09 NOTE — Telephone Encounter (Signed)
Call patient back with DAT Scan approval . Call her Friday with details.

## 2016-09-10 ENCOUNTER — Telehealth: Payer: Self-pay | Admitting: Neurology

## 2016-09-10 NOTE — Telephone Encounter (Signed)
NPR required for Medicare. [id number] .

## 2016-09-10 NOTE — Therapy (Signed)
[facility] [address] [place], [place], [address] Phone: [phone]   Fax:  [phone]  Physical Therapy Treatment  Patient Details  Name: Tara Ingram MRN: [id number] Date of Birth: 1946/01/14 Referring Provider: Dr. Anda Kraft (will be seeing Dr. Shelia Media)  Encounter Date: 09/09/2016      PT End of Session - 09/10/16 1706    Visit Number 4   Number of Visits 17   Date for PT Re-Evaluation 10/08/16   Authorization Type Medicare   Authorization Time Period 08-09-16 - 10-08-16   PT Start Time 1535   PT Stop Time 1622   PT Time Calculation (min) 47 min   Equipment Utilized During Treatment Gait belt      Past Medical History:  Diagnosis Date  . Allergy   . Arthritis    hands/feet/back ra  . Bladder cancer ([place])   . Cataract    bilateral  . GERD (gastroesophageal reflux disease)   . Headache   . History of kidney stones 50 yrs ago  . Hyperlipidemia   . Hypertension   . Sleep apnea    uses CPAP every night setting of 12    Past Surgical History:  Procedure Laterality Date  . APPENDECTOMY    . BACK SURGERY  05/2014   lower back for crushed disc  . CHOLECYSTECTOMY    . COLONOSCOPY  12/2011   hx polyps/jacobs  . CORNEAL TRANSPLANT     bilateral  . CYSTOSCOPY W/ RETROGRADES Bilateral 09/18/2015   Procedure: BILATERAL RETROGRADE PYELOGRAM;  Surgeon: Ardis Hughs, MD;  [facility];  Service: Urology;  Laterality: Bilateral;  . CYSTOSCOPY W/ RETROGRADES Right 10/17/2015   Procedure: CYSTOSCOPY WITH RETROGRADE PYELOGRAM;  Surgeon: Ardis Hughs, MD;  [facility];  Service: Urology;  Laterality: Right;  . EYE SURGERY Bilateral    lens replacement for cataract  . FOOT SURGERY Left 6 yrs ago   tammer toe reconstruction  . PARTIAL HYSTERECTOMY    . TONSILLECTOMY    . TRANSURETHRAL RESECTION OF BLADDER TUMOR N/A 09/18/2015   Procedure: TRANSURETHRAL RESECTION OF BLADDER TUMOR (TURBT);   Surgeon: Ardis Hughs, MD;  [facility];  Service: Urology;  Laterality: N/A;  . TRANSURETHRAL RESECTION OF BLADDER TUMOR N/A 10/17/2015   Procedure: TRANSURETHRAL RESECTION OF BLADDER TUMOR (TURBT);  Surgeon: Ardis Hughs, MD;  [facility];  Service: Urology;  Laterality: N/A;    There were no vitals filed for this visit.      Subjective Assessment - 09/10/16 1702    Subjective Pt states she has not been able to do exercises as much as she should - has been busy   Pertinent History Bladder cancer Aug. 2017; compression fracture in April 2016 due to fall with surgery   Patient Stated Goals Improve walking - "I want to walk right"                         [facility] Adult PT Treatment/Exercise - 09/10/16 0001      Ambulation/Gait   Ambulation/Gait Yes   Ambulation/Gait Assistance 5: Supervision   Ambulation/Gait Assistance Details cues to increase step length and increase initial heel contact    Ambulation Distance (Feet) 200 Feet   Assistive device Rolling walker   Gait Pattern Decreased stride length;Decreased step length - right;Decreased step length - left;Decreased dorsiflexion - right;Decreased dorsiflexion - left;Shuffle;Decreased trunk rotation   Ambulation Surface Level;Indoor  High Level Balance   High Level Balance Activities Side stepping;Turns;Marching forwards;Negotiating over obstacles  stepping over and back of balance beam iwth UE support prn      Pt performed PWR exercises - stepping forward, back and side with UE movement with verbal cues for big step 10 reps each leg each direction   Alternate tap ups to 6" step with UE support with CGA - 10 reps each leg         PT Short Term Goals - 08/09/16 1946      PT SHORT TERM GOAL #1   Title Improve TUG score to </= 25 secs with SPC for improved functional mobility/reduced fall risk.  09-09-16   Baseline 29.02 secs with SPC   Time 4   Period Weeks   Status New     PT SHORT  TERM GOAL #2   Title Incr. gait velocity to >/= 1.4 ft/sec with SPC for incr. gait efficiency.  09-09-16   Baseline 27.62 secs = 1.19 ft/sec   Time 4   Period Weeks   Status New     PT SHORT TERM GOAL #3   Title Improve Berg score to >/= 44/56 to decr. fall risk.  09-09-16   Baseline 40/56    Time 4   Period Weeks   Status New     PT SHORT TERM GOAL #4   Title Amb. 350' with rollator with SBA on flat, even surface.  09-09-16   Time 4   Period Weeks   Status New     PT SHORT TERM GOAL #5   Title Independent in HEP for balance and strengthening.  09-09-16   Time 4   Period Weeks   Status New           PT Long Term Goals - 08/09/16 2024      PT LONG TERM GOAL #1   Title Increase Berg balance test score from 40/56 to >/= 48/56 to decr. fall risk.  10-08-16   Time 8   Period Weeks   Status New     PT LONG TERM GOAL #2   Title Improve TUG score to </= 21 secs with SPC for reduced fall risk.  10-08-16   Baseline 29.02 secs with SPC   Time 8   Period Weeks   Status New     PT LONG TERM GOAL #3   Title Incr. gait velocity from 1.19 to >/= 1.7 ft/sec with SPC for incr. gait efficiency.  10-08-16   Time 8   Period Weeks   Status New     PT LONG TERM GOAL #4   Title Amb. 500' with rollator with SBA on flat, even surface.  10-08-16   Time 8   Period Weeks   Status New     PT LONG TERM GOAL #5   Title Independent in updated HEP.  10-08-16   Time 8   Period Weeks   Status New               Plan - 09/10/16 1707    Clinical Impression Statement Pt continues to have shuffling gait with decreased step length and decreased initial heel contact; able to correct and minimize deviations for approx. 10' prior to resuming gait deviations   PT Frequency 2x / week   PT Duration 8 weeks   PT Treatment/Interventions ADLs/Self Care Home Management;DME Instruction;Gait training;Stair training;Functional mobility training;Therapeutic activities;Therapeutic exercise;Balance  training;Neuromuscular re-education;Patient/family education   PT Next Visit Plan add to  balance HEP   PT Home Exercise Plan see above   Consulted and Agree with Plan of Care Patient      Patient will benefit from skilled therapeutic intervention in order to improve the following deficits and impairments:  Abnormal gait, Decreased balance, Decreased knowledge of use of DME, Decreased strength, Pain  Visit Diagnosis: Other abnormalities of gait and mobility  Unsteadiness on feet     Problem List Patient Active Problem List   Diagnosis Date Noted  . Malignant tumor of trigone of bladder ([place]) 09/18/2015  . OSA (obstructive sleep apnea) 12/30/2014  . Closed wedge compression fracture of second lumbar vertebra ([place])   . DIARRHEA 04/17/2010    Alda Lea, PT 09/10/2016, 5:10 PM  [facility] [address] [place], [place], [address] Phone: [phone]   Fax:  [phone]  Name: Tara Ingram MRN: [id number] Date of Birth: 1945-04-07

## 2016-09-11 ENCOUNTER — Telehealth: Payer: Self-pay | Admitting: Neurology

## 2016-09-11 DIAGNOSIS — M4802 Spinal stenosis, cervical region: Principal | ICD-10-CM

## 2016-09-11 DIAGNOSIS — G1229 Other motor neuron disease: Secondary | ICD-10-CM

## 2016-09-11 DIAGNOSIS — G912 (Idiopathic) normal pressure hydrocephalus: Secondary | ICD-10-CM

## 2016-09-11 DIAGNOSIS — G1221 Amyotrophic lateral sclerosis: Secondary | ICD-10-CM

## 2016-09-11 DIAGNOSIS — G992 Myelopathy in diseases classified elsewhere: Secondary | ICD-10-CM

## 2016-09-11 NOTE — Telephone Encounter (Signed)
Called patient earlier this week left message. Tried calling again today, left message on home number. Patient answered her cell phone and then we were disconnected. I called back and left my personal cell phone number on her cell phone to call me back but I have not received any calls.  Patient has lower shuffling gait after a fall. 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 Need MRI cervical spine to eval for significant stenosis that may be causing the buildup of csf fluid and afterwards a high-volume tap of 30-8ml to see if her gait improves. Will try to call back a few more times and if I cannot reach her will ask a colleague on Monday to try and call.

## 2016-09-11 NOTE — Telephone Encounter (Signed)
Called patient earlier this week left message. Tried calling again today, left message on home number. Patient answered her cell phone and then we were disconnected. I called back and left my personal cell phone number on her cell phone to call me back but I have not received any calls.  Patient has lower shuffling gait after a fall. 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 Need MRI cervical spine to eval for significant stenosis that may be causing the buildup of csf fluid and afterwards a high-volume tap of 30-93ml to see if her gait improves. Will try to call back a few more times and if I cannot reach her will ask a colleague on Monday to try and call.

## 2016-09-13 ENCOUNTER — Ambulatory Visit: Payer: Medicare Other | Admitting: Physical Therapy

## 2016-09-13 DIAGNOSIS — R2689 Other abnormalities of gait and mobility: Secondary | ICD-10-CM | POA: Diagnosis not present

## 2016-09-13 DIAGNOSIS — R2681 Unsteadiness on feet: Secondary | ICD-10-CM | POA: Diagnosis not present

## 2016-09-13 NOTE — Telephone Encounter (Signed)
MRI results and booklet about NPH from [employer] mailed to patient.

## 2016-09-13 NOTE — Telephone Encounter (Signed)
Orders for MRIs and LP placed per MD.

## 2016-09-13 NOTE — Telephone Encounter (Signed)
The patient is scheduled to have both MRI's at [facility] long arrival time is 6:45 pm. Patient is aware of this.. She asked me about picking up some paper work and if I was aware of that and I informed her I'm sorry I wasn't.

## 2016-09-13 NOTE — Telephone Encounter (Signed)
See additional phone note. 

## 2016-09-13 NOTE — Telephone Encounter (Signed)
Called and spoke to pt. She's scheduled to have MRIs on Wed at [facility]. Requested to pick up MRI results and NPH info, re-printed and placed at the front desk. Verbalized understanding and appreciation for call.

## 2016-09-13 NOTE — Addendum Note (Signed)
Addended by: Monte Fantasia on: 09/13/2016 10:32 AM   Modules accepted: Orders

## 2016-09-14 NOTE — Therapy (Signed)
[facility] [address] [place], [place], [address] Phone: [phone]   Fax:  [phone]  Physical Therapy Treatment  Patient Details  Name: Tara Ingram MRN: [id number] Date of Birth: 1945-10-04 Referring Provider: Dr. Anda Kraft (will be seeing Dr. Shelia Media)  Encounter Date: 09/13/2016      PT End of Session - 09/14/16 1942    Visit Number 5   Number of Visits 17   Date for PT Re-Evaluation 10/08/16   Authorization Type Medicare   Authorization Time Period 08-09-16 - 10-08-16   PT Start Time 1316   PT Stop Time 1400   PT Time Calculation (min) 44 min   Equipment Utilized During Treatment Gait belt      Past Medical History:  Diagnosis Date  . Allergy   . Arthritis    hands/feet/back ra  . Bladder cancer ([place])   . Cataract    bilateral  . GERD (gastroesophageal reflux disease)   . Headache   . History of kidney stones 50 yrs ago  . Hyperlipidemia   . Hypertension   . Sleep apnea    uses CPAP every night setting of 12    Past Surgical History:  Procedure Laterality Date  . APPENDECTOMY    . BACK SURGERY  05/2014   lower back for crushed disc  . CHOLECYSTECTOMY    . COLONOSCOPY  12/2011   hx polyps/jacobs  . CORNEAL TRANSPLANT     bilateral  . CYSTOSCOPY W/ RETROGRADES Bilateral 09/18/2015   Procedure: BILATERAL RETROGRADE PYELOGRAM;  Surgeon: Ardis Hughs, MD;  Location: [facility];  Service: Urology;  Laterality: Bilateral;  . CYSTOSCOPY W/ RETROGRADES Right 10/17/2015   Procedure: CYSTOSCOPY WITH RETROGRADE PYELOGRAM;  Surgeon: Ardis Hughs, MD;  Location: [facility];  Service: Urology;  Laterality: Right;  . EYE SURGERY Bilateral    lens replacement for cataract  . FOOT SURGERY Left 6 yrs ago   tammer toe reconstruction  . PARTIAL HYSTERECTOMY    . TONSILLECTOMY    . TRANSURETHRAL RESECTION OF BLADDER TUMOR N/A 09/18/2015   Procedure: TRANSURETHRAL RESECTION OF BLADDER TUMOR (TURBT);   Surgeon: Ardis Hughs, MD;  Location: [facility];  Service: Urology;  Laterality: N/A;  . TRANSURETHRAL RESECTION OF BLADDER TUMOR N/A 10/17/2015   Procedure: TRANSURETHRAL RESECTION OF BLADDER TUMOR (TURBT);  Surgeon: Ardis Hughs, MD;  Location: [facility];  Service: Urology;  Laterality: N/A;    There were no vitals filed for this visit.      Subjective Assessment - 09/14/16 1935    Subjective Pt states she talked to Dr. Jaynee Eagles regarding MRI results - she says she may have NPH: is having another MRI (with contrast) on Wed. evening to try to figure out what is going on   Pertinent History Bladder cancer Aug. 2017; compression fracture in April 2016 due to fall with surgery   Patient Stated Goals Improve walking - "I want to walk right"   Currently in Pain? No/denies                         [facility] Adult PT Treatment/Exercise - 09/14/16 0001      Ambulation/Gait   Ambulation/Gait Yes   Ambulation/Gait Assistance 5: Supervision   Ambulation/Gait Assistance Details cues to increase step length   Ambulation Distance (Feet) 115 Feet  130' with hand held assist   Assistive device Rolling walker   Gait Pattern Decreased stride length;Decreased step length -  right;Decreased step length - left;Decreased dorsiflexion - right;Decreased dorsiflexion - left;Shuffle;Decreased trunk rotation   Ambulation Surface Indoor;Level     Knee/Hip Exercises: Stretches   Active Hamstring Stretch Both;1 rep;30 seconds     Knee/Hip Exercises: Aerobic   Nustep Level 3 x 5" with UE's and LE's             Balance Exercises - 09/14/16 1941      Balance Exercises: Standing   Standing Eyes Opened Wide (BOA);Solid surface;2 reps;Head turns   Other Standing Exercises tap ups to 4" step with UE support inside // bars 10 reps each leg      Ladder on floor - min hand held assist - 6 reps- to facilitate increased step length and SLS       PT Short Term Goals - 08/09/16 1946      PT  SHORT TERM GOAL #1   Title Improve TUG score to </= 25 secs with SPC for improved functional mobility/reduced fall risk.  09-09-16   Baseline 29.02 secs with SPC   Time 4   Period Weeks   Status New     PT SHORT TERM GOAL #2   Title Incr. gait velocity to >/= 1.4 ft/sec with SPC for incr. gait efficiency.  09-09-16   Baseline 27.62 secs = 1.19 ft/sec   Time 4   Period Weeks   Status New     PT SHORT TERM GOAL #3   Title Improve Berg score to >/= 44/56 to decr. fall risk.  09-09-16   Baseline 40/56    Time 4   Period Weeks   Status New     PT SHORT TERM GOAL #4   Title Amb. 350' with rollator with SBA on flat, even surface.  09-09-16   Time 4   Period Weeks   Status New     PT SHORT TERM GOAL #5   Title Independent in HEP for balance and strengthening.  09-09-16   Time 4   Period Weeks   Status New           PT Long Term Goals - 08/09/16 2024      PT LONG TERM GOAL #1   Title Increase Berg balance test score from 40/56 to >/= 48/56 to decr. fall risk.  10-08-16   Time 8   Period Weeks   Status New     PT LONG TERM GOAL #2   Title Improve TUG score to </= 21 secs with SPC for reduced fall risk.  10-08-16   Baseline 29.02 secs with SPC   Time 8   Period Weeks   Status New     PT LONG TERM GOAL #3   Title Incr. gait velocity from 1.19 to >/= 1.7 ft/sec with SPC for incr. gait efficiency.  10-08-16   Time 8   Period Weeks   Status New     PT LONG TERM GOAL #4   Title Amb. 500' with rollator with SBA on flat, even surface.  10-08-16   Time 8   Period Weeks   Status New     PT LONG TERM GOAL #5   Title Independent in updated HEP.  10-08-16   Time 8   Period Weeks   Status New               Plan - 09/14/16 1943    Clinical Impression Statement Pt able to minimize gait deviations for approx. 15' prior to resuming taking short steps; pt  did well with ladder activity to facilitate taking longer steps (hand held assist provided)   Rehab Potential Good   PT Frequency  2x / week   PT Duration 8 weeks   PT Treatment/Interventions ADLs/Self Care Home Management;DME Instruction;Gait training;Stair training;Functional mobility training;Therapeutic activities;Therapeutic exercise;Balance training;Neuromuscular re-education;Patient/family education   PT Next Visit Plan check balance exercises given on 09-13-16 (forward, back and side kicks and marching); check STG's   PT Home Exercise Plan see above   Consulted and Agree with Plan of Care Patient      Patient will benefit from skilled therapeutic intervention in order to improve the following deficits and impairments:  Abnormal gait, Decreased balance, Decreased knowledge of use of DME, Decreased strength, Pain  Visit Diagnosis: Other abnormalities of gait and mobility  Unsteadiness on feet     Problem List Patient Active Problem List   Diagnosis Date Noted  . Malignant tumor of trigone of bladder ([place]) 09/18/2015  . OSA (obstructive sleep apnea) 12/30/2014  . Closed wedge compression fracture of second lumbar vertebra ([place])   . DIARRHEA 04/17/2010    Alda Lea, PT 09/14/2016, 7:48 PM  [facility] [address] [place], [place], [address] Phone: [phone]   Fax:  [phone]  Name: Tara Ingram MRN: [id number] Date of Birth: 02/12/1945

## 2016-09-14 NOTE — Telephone Encounter (Signed)
Tara Ingram with [facility] scheduling is calling to get order signed in Epic for MRI thoracic spine and cervical spine w/o contrast for the patient.

## 2016-09-14 NOTE — Telephone Encounter (Signed)
Patient is scheduled for DAT scan 10/14/2016. Patient aware.

## 2016-09-15 ENCOUNTER — Ambulatory Visit (HOSPITAL_COMMUNITY)
Admission: RE | Admit: 2016-09-15 | Discharge: 2016-09-15 | Disposition: A | Discharge status: Home / Self Care | Payer: Medicare Other | Source: Ambulatory Visit | Attending: Neurology | Admitting: Neurology

## 2016-09-15 DIAGNOSIS — M50322 Other cervical disc degeneration at C5-C6 level: Secondary | ICD-10-CM | POA: Diagnosis not present

## 2016-09-15 DIAGNOSIS — G992 Myelopathy in diseases classified elsewhere: Secondary | ICD-10-CM

## 2016-09-15 DIAGNOSIS — M4802 Spinal stenosis, cervical region: Principal | ICD-10-CM

## 2016-09-15 DIAGNOSIS — G1229 Other motor neuron disease: Secondary | ICD-10-CM | POA: Diagnosis not present

## 2016-09-15 DIAGNOSIS — G9589 Other specified diseases of spinal cord: Secondary | ICD-10-CM | POA: Diagnosis present

## 2016-09-15 DIAGNOSIS — S299XXA Unspecified injury of thorax, initial encounter: Secondary | ICD-10-CM | POA: Diagnosis not present

## 2016-09-15 DIAGNOSIS — G1221 Amyotrophic lateral sclerosis: Secondary | ICD-10-CM

## 2016-09-15 DIAGNOSIS — S199XXA Unspecified injury of neck, initial encounter: Secondary | ICD-10-CM | POA: Diagnosis not present

## 2016-09-15 DIAGNOSIS — M5124 Other intervertebral disc displacement, thoracic region: Secondary | ICD-10-CM | POA: Insufficient documentation

## 2016-09-20 ENCOUNTER — Telehealth: Payer: Self-pay | Admitting: *Deleted

## 2016-09-20 NOTE — Telephone Encounter (Signed)
Spoke with patient and informed her that Dr Leta Baptist reviewed her MRI of cervical (neck) and thoracic spine (upper back). Advised her Dr Leta Baptist stated she has degenerative / arthritis changes and no major findings.  Advised her these are typically age related. She verbalized understanding, appreciation, had no questions.

## 2016-09-21 ENCOUNTER — Ambulatory Visit: Payer: Medicare Other | Admitting: Physical Therapy

## 2016-09-21 DIAGNOSIS — R2689 Other abnormalities of gait and mobility: Secondary | ICD-10-CM

## 2016-09-21 DIAGNOSIS — R2681 Unsteadiness on feet: Secondary | ICD-10-CM | POA: Diagnosis not present

## 2016-09-21 NOTE — Patient Instructions (Signed)
SINGLE LIMB STANCE    Stance: single leg on floor. Raise leg. Hold _10__ seconds. Repeat with other leg. _1__ reps per set, __2_ sets per day, _5_ days per week  Copyright  VHI. All rights reserved.

## 2016-09-22 DIAGNOSIS — M47817 Spondylosis without myelopathy or radiculopathy, lumbosacral region: Secondary | ICD-10-CM | POA: Diagnosis not present

## 2016-09-22 NOTE — Therapy (Signed)
[facility] [address] [place], [place], [address] Phone: [phone]   Fax:  [phone]  Physical Therapy Treatment  Patient Details  Name: Tara Ingram MRN: [id number] Date of Birth: 08/02/1945 Referring Provider: Dr. Anda Kraft (will be seeing Dr. Shelia Media)  Encounter Date: 09/21/2016      PT End of Session - 09/22/16 2130    Visit Number 6   Number of Visits 17   Date for PT Re-Evaluation 10/08/16   Authorization Type Medicare   Authorization Time Period 08-09-16 - 10-08-16   PT Start Time 9381   PT Stop Time 0175   PT Time Calculation (min) 46 min      Past Medical History:  Diagnosis Date  . Allergy   . Arthritis    hands/feet/back ra  . Bladder cancer ([place])   . Cataract    bilateral  . GERD (gastroesophageal reflux disease)   . Headache   . History of kidney stones 50 yrs ago  . Hyperlipidemia   . Hypertension   . Sleep apnea    uses CPAP every night setting of 12    Past Surgical History:  Procedure Laterality Date  . APPENDECTOMY    . BACK SURGERY  05/2014   lower back for crushed disc  . CHOLECYSTECTOMY    . COLONOSCOPY  12/2011   hx polyps/jacobs  . CORNEAL TRANSPLANT     bilateral  . CYSTOSCOPY W/ RETROGRADES Bilateral 09/18/2015   Procedure: BILATERAL RETROGRADE PYELOGRAM;  Surgeon: Ardis Hughs, MD;  [facility];  Service: Urology;  Laterality: Bilateral;  . CYSTOSCOPY W/ RETROGRADES Right 10/17/2015   Procedure: CYSTOSCOPY WITH RETROGRADE PYELOGRAM;  Surgeon: Ardis Hughs, MD;  [facility];  Service: Urology;  Laterality: Right;  . EYE SURGERY Bilateral    lens replacement for cataract  . FOOT SURGERY Left 6 yrs ago   tammer toe reconstruction  . PARTIAL HYSTERECTOMY    . TONSILLECTOMY    . TRANSURETHRAL RESECTION OF BLADDER TUMOR N/A 09/18/2015   Procedure: TRANSURETHRAL RESECTION OF BLADDER TUMOR (TURBT);  Surgeon: Ardis Hughs, MD;  [facility];  Service: Urology;  Laterality: N/A;  . TRANSURETHRAL RESECTION OF BLADDER TUMOR N/A 10/17/2015   Procedure: TRANSURETHRAL RESECTION OF BLADDER TUMOR (TURBT);  Surgeon: Ardis Hughs, MD;  [facility];  Service: Urology;  Laterality: N/A;    There were no vitals filed for this visit.      Subjective Assessment - 09/22/16 2127    Subjective Pt states she had MRI done last Wed. - says it did not show anything with cervical or thoracic spine: states she is having lumbar puncture done next Friday to see if she has NPH   Pertinent History Bladder cancer Aug. 2017; compression fracture in April 2016 due to fall with surgery   Patient Stated Goals Improve walking - "I want to walk right"   Currently in Pain? No/denies                                   PT Short Term Goals - 08/09/16 1946      PT SHORT TERM GOAL #1   Title Improve TUG score to </= 25 secs with SPC for improved functional mobility/reduced fall risk.  09-09-16   Baseline 29.02 secs with SPC   Time 4   Period Weeks   Status New  PT SHORT TERM GOAL #2   Title Incr. gait velocity to >/= 1.4 ft/sec with SPC for incr. gait efficiency.  09-09-16   Baseline 27.62 secs = 1.19 ft/sec   Time 4   Period Weeks   Status New     PT SHORT TERM GOAL #3   Title Improve Berg score to >/= 44/56 to decr. fall risk.  09-09-16   Baseline 40/56    Time 4   Period Weeks   Status New     PT SHORT TERM GOAL #4   Title Amb. 350' with rollator with SBA on flat, even surface.  09-09-16   Time 4   Period Weeks   Status New     PT SHORT TERM GOAL #5   Title Independent in HEP for balance and strengthening.  09-09-16   Time 4   Period Weeks   Status New           PT Long Term Goals - 08/09/16 2024      PT LONG TERM GOAL #1   Title Increase Berg balance test score from 40/56 to >/= 48/56 to decr. fall risk.  10-08-16   Time 8   Period Weeks   Status New     PT LONG TERM GOAL #2   Title Improve  TUG score to </= 21 secs with SPC for reduced fall risk.  10-08-16   Baseline 29.02 secs with SPC   Time 8   Period Weeks   Status New     PT LONG TERM GOAL #3   Title Incr. gait velocity from 1.19 to >/= 1.7 ft/sec with SPC for incr. gait efficiency.  10-08-16   Time 8   Period Weeks   Status New     PT LONG TERM GOAL #4   Title Amb. 500' with rollator with SBA on flat, even surface.  10-08-16   Time 8   Period Weeks   Status New     PT LONG TERM GOAL #5   Title Independent in updated HEP.  10-08-16   Time 8   Period Weeks   Status New             Patient will benefit from skilled therapeutic intervention in order to improve the following deficits and impairments:     Visit Diagnosis: Other abnormalities of gait and mobility  Unsteadiness on feet     Problem List Patient Active Problem List   Diagnosis Date Noted  . Malignant tumor of trigone of bladder ([place]) 09/18/2015  . OSA (obstructive sleep apnea) 12/30/2014  . Closed wedge compression fracture of second lumbar vertebra ([place])   . DIARRHEA 04/17/2010    Alda Lea, PT 09/22/2016, 9:31 PM  [facility] [address] [place], [place], [address] Phone: [phone]   Fax:  [phone]  Name: Tara Ingram MRN: [id number] Date of Birth: [birth year]

## 2016-09-23 ENCOUNTER — Ambulatory Visit: Payer: Medicare Other | Admitting: Physical Therapy

## 2016-09-23 DIAGNOSIS — R3915 Urgency of urination: Secondary | ICD-10-CM | POA: Diagnosis not present

## 2016-09-23 DIAGNOSIS — C674 Malignant neoplasm of posterior wall of bladder: Secondary | ICD-10-CM | POA: Diagnosis not present

## 2016-09-24 NOTE — Therapy (Signed)
[facility] [address] [place], [place], [address] Phone: [phone]   Fax:  [phone]  Physical Therapy Treatment  Patient Details  Name: Tara Ingram MRN: [id number] Date of Birth: [birth year] Referring Provider: Dr. Anda Kraft (will be seeing Dr. Shelia Media)  Encounter Date: 09/21/2016      PT End of Session - 09/24/16 1555    Visit Number 6   Number of Visits 17   Date for PT Re-Evaluation 10/08/16   Authorization Type Medicare   Authorization Time Period 08-09-16 - 10-08-16   PT Start Time 1535   PT Stop Time 1621   PT Time Calculation (min) 46 min   Equipment Utilized During Treatment Gait belt      Past Medical History:  Diagnosis Date  . Allergy   . Arthritis    hands/feet/back ra  . Bladder cancer ([place])   . Cataract    bilateral  . GERD (gastroesophageal reflux disease)   . Headache   . History of kidney stones 50 yrs ago  . Hyperlipidemia   . Hypertension   . Sleep apnea    uses CPAP every night setting of 12    Past Surgical History:  Procedure Laterality Date  . APPENDECTOMY    . BACK SURGERY  05/2014   lower back for crushed disc  . CHOLECYSTECTOMY    . COLONOSCOPY  12/2011   hx polyps/jacobs  . CORNEAL TRANSPLANT     bilateral  . CYSTOSCOPY W/ RETROGRADES Bilateral 09/18/2015   Procedure: BILATERAL RETROGRADE PYELOGRAM;  Surgeon: Ardis Hughs, MD;  [facility];  Service: Urology;  Laterality: Bilateral;  . CYSTOSCOPY W/ RETROGRADES Right 10/17/2015   Procedure: CYSTOSCOPY WITH RETROGRADE PYELOGRAM;  Surgeon: Ardis Hughs, MD;  [facility];  Service: Urology;  Laterality: Right;  . EYE SURGERY Bilateral    lens replacement for cataract  . FOOT SURGERY Left 6 yrs ago   tammer toe reconstruction  . PARTIAL HYSTERECTOMY    . TONSILLECTOMY    . TRANSURETHRAL RESECTION OF BLADDER TUMOR N/A 09/18/2015   Procedure: TRANSURETHRAL RESECTION OF BLADDER TUMOR (TURBT);   Surgeon: Ardis Hughs, MD;  [facility];  Service: Urology;  Laterality: N/A;  . TRANSURETHRAL RESECTION OF BLADDER TUMOR N/A 10/17/2015   Procedure: TRANSURETHRAL RESECTION OF BLADDER TUMOR (TURBT);  Surgeon: Ardis Hughs, MD;  [facility];  Service: Urology;  Laterality: N/A;    There were no vitals filed for this visit.                       [facility] Adult PT Treatment/Exercise - 09/24/16 0001      Ambulation/Gait   Ambulation/Gait Yes   Ambulation/Gait Assistance 5: Supervision   Ambulation/Gait Assistance Details cues to increase step length   Ambulation Distance (Feet) 125 Feet   Assistive device Rolling walker   Gait Pattern Decreased stride length;Decreased step length - right;Decreased step length - left;Decreased dorsiflexion - right;Decreased dorsiflexion - left;Shuffle;Decreased trunk rotation   Ambulation Surface Level;Indoor     High Level Balance   High Level Balance Activities Side stepping;Backward walking;Direction changes;Marching forwards;Negotiating over obstacles      Reviewed balance HEP - forward, backward and side kicks x 10 reps each leg with UE support inside // bars Stepping exercise with UE flexion - forward, back and side 10 reps each side each direction  Alternate tap ups to 4"step 10 reps each leg Rockerboard anterior/posterior 15 reps  with UE support prn  Pt rode Nustep 5" at level 3 with UE's and LE's            PT Short Term Goals - 08/09/16 1946      PT SHORT TERM GOAL #1   Title Improve TUG score to </= 25 secs with SPC for improved functional mobility/reduced fall risk.  09-09-16   Baseline 29.02 secs with SPC   Time 4   Period Weeks   Status New     PT SHORT TERM GOAL #2   Title Incr. gait velocity to >/= 1.4 ft/sec with SPC for incr. gait efficiency.  09-09-16   Baseline 27.62 secs = 1.19 ft/sec   Time 4   Period Weeks   Status New     PT SHORT TERM GOAL #3   Title Improve Berg score to >/=  44/56 to decr. fall risk.  09-09-16   Baseline 40/56    Time 4   Period Weeks   Status New     PT SHORT TERM GOAL #4   Title Amb. 350' with rollator with SBA on flat, even surface.  09-09-16   Time 4   Period Weeks   Status New     PT SHORT TERM GOAL #5   Title Independent in HEP for balance and strengthening.  09-09-16   Time 4   Period Weeks   Status New           PT Long Term Goals - 08/09/16 2024      PT LONG TERM GOAL #1   Title Increase Berg balance test score from 40/56 to >/= 48/56 to decr. fall risk.  10-08-16   Time 8   Period Weeks   Status New     PT LONG TERM GOAL #2   Title Improve TUG score to </= 21 secs with SPC for reduced fall risk.  10-08-16   Baseline 29.02 secs with SPC   Time 8   Period Weeks   Status New     PT LONG TERM GOAL #3   Title Incr. gait velocity from 1.19 to >/= 1.7 ft/sec with SPC for incr. gait efficiency.  10-08-16   Time 8   Period Weeks   Status New     PT LONG TERM GOAL #4   Title Amb. 500' with rollator with SBA on flat, even surface.  10-08-16   Time 8   Period Weeks   Status New     PT LONG TERM GOAL #5   Title Independent in updated HEP.  10-08-16   Time 8   Period Weeks   Status New             Patient will benefit from skilled therapeutic intervention in order to improve the following deficits and impairments:     Visit Diagnosis: Other abnormalities of gait and mobility  Unsteadiness on feet     Problem List Patient Active Problem List   Diagnosis Date Noted  . Malignant tumor of trigone of bladder ([place]) 09/18/2015  . OSA (obstructive sleep apnea) 12/30/2014  . Closed wedge compression fracture of second lumbar vertebra ([place])   . DIARRHEA 04/17/2010    Alda Lea, PT 09/24/2016, 3:56 PM  [facility] [address] [place], [place], [address] Phone: [phone]   Fax:  [phone]  Name: Tara Ingram MRN: [id number] Date  of Birth: 12-16-1945

## 2016-09-27 ENCOUNTER — Ambulatory Visit: Payer: Medicare Other | Admitting: Physical Therapy

## 2016-09-27 DIAGNOSIS — M47817 Spondylosis without myelopathy or radiculopathy, lumbosacral region: Secondary | ICD-10-CM | POA: Diagnosis not present

## 2016-09-27 DIAGNOSIS — R2689 Other abnormalities of gait and mobility: Secondary | ICD-10-CM | POA: Diagnosis not present

## 2016-09-27 DIAGNOSIS — M8000XS Age-related osteoporosis with current pathological fracture, unspecified site, sequela: Secondary | ICD-10-CM | POA: Diagnosis not present

## 2016-09-27 DIAGNOSIS — M62830 Muscle spasm of back: Secondary | ICD-10-CM | POA: Diagnosis not present

## 2016-09-27 DIAGNOSIS — R2681 Unsteadiness on feet: Secondary | ICD-10-CM | POA: Diagnosis not present

## 2016-09-27 DIAGNOSIS — G894 Chronic pain syndrome: Secondary | ICD-10-CM | POA: Diagnosis not present

## 2016-09-27 NOTE — Patient Instructions (Signed)
Gastroc / Heel Cord Stretch - On Step    Stand with heels over edge of stair. Holding rail, lower heels until stretch is felt in calf of legs. Repeat __1_ times. Do _2__ times per day. HOLD 30 secs - each leg - can use bottom shelf of cabinet rather than step.Gastroc Stretch    Stand with right foot back, leg straight, forward leg bent. Keeping heel on floor, turned slightly out, lean into wall until stretch is felt in calf. Hold _30___ seconds. Repeat _1___ times per set. Do _1___ sets per session. Do 2 sessions per day.  DO EACH LEG _ AT COUNTER TOP   Copyright  VHI. All rights reserved.

## 2016-09-28 NOTE — Therapy (Signed)
Tara Ingram [address] [place], [place], [address] Phone: [phone]   Fax:  [phone]  Physical Therapy Treatment  Patient Details  Name: Tara Ingram MRN: [id number] Date of Birth: May 24, 1945 Referring Provider: Dr. Anda Kraft (will be seeing Dr. Shelia Media)  Encounter Date: 09/27/2016      PT End of Session - 09/28/16 2157    Visit Number 7   Number of Visits 17   Date for PT Re-Evaluation 10/08/16   Authorization Type Medicare   Authorization Time Period 08-09-16 - 10-08-16   PT Start Time 1536   PT Stop Time 1620   PT Time Calculation (min) 44 min      Past Medical History:  Diagnosis Date  . Allergy   . Arthritis    hands/feet/back ra  . Bladder cancer ([place])   . Cataract    bilateral  . GERD (gastroesophageal reflux disease)   . Headache   . History of kidney stones 50 yrs ago  . Hyperlipidemia   . Hypertension   . Sleep apnea    uses CPAP every night setting of 12    Past Surgical History:  Procedure Laterality Date  . APPENDECTOMY    . BACK SURGERY  05/2014   lower back for crushed disc  . CHOLECYSTECTOMY    . COLONOSCOPY  12/2011   hx polyps/jacobs  . CORNEAL TRANSPLANT     bilateral  . CYSTOSCOPY W/ RETROGRADES Bilateral 09/18/2015   Procedure: BILATERAL RETROGRADE PYELOGRAM;  Surgeon: Ardis Hughs, MD;  [facility]: [facility];  Service: Urology;  Laterality: Bilateral;  . CYSTOSCOPY W/ RETROGRADES Right 10/17/2015   Procedure: CYSTOSCOPY WITH RETROGRADE PYELOGRAM;  Surgeon: Ardis Hughs, MD;  [facility];  Service: Urology;  Laterality: Right;  . EYE SURGERY Bilateral    lens replacement for cataract  . FOOT SURGERY Left 6 yrs ago   tammer toe reconstruction  . PARTIAL HYSTERECTOMY    . TONSILLECTOMY    . TRANSURETHRAL RESECTION OF BLADDER TUMOR N/A 09/18/2015   Procedure: TRANSURETHRAL RESECTION OF BLADDER TUMOR (TURBT);  Surgeon: Ardis Hughs, MD;  [facility];  Service: Urology;  Laterality: N/A;  . TRANSURETHRAL RESECTION OF BLADDER TUMOR N/A 10/17/2015   Procedure: TRANSURETHRAL RESECTION OF BLADDER TUMOR (TURBT);  Surgeon: Ardis Hughs, MD;  [facility];  Service: Urology;  Laterality: N/A;    There were no vitals filed for this visit.      Subjective Assessment - 09/28/16 2156    Subjective Pt states she has lumbar puncture done on Friday - wants to hold PT until after this procedure to see exactly what her diagnosis is   Pertinent History Bladder cancer Aug. 2017; compression fracture in April 2016 due to fall with surgery   Patient Stated Goals Improve walking - "I want to walk right"   Currently in Pain? No/denies                                   PT Short Term Goals - 08/09/16 1946      PT SHORT TERM GOAL #1   Title Improve TUG score to </= 25 secs with SPC for improved functional mobility/reduced fall risk.  09-09-16   Baseline 29.02 secs with SPC   Time 4   Period Weeks   Status New     PT SHORT TERM GOAL #2   Title Incr.  gait velocity to >/= 1.4 ft/sec with SPC for incr. gait efficiency.  09-09-16   Baseline 27.62 secs = 1.19 ft/sec   Time 4   Period Weeks   Status New     PT SHORT TERM GOAL #3   Title Improve Berg score to >/= 44/56 to decr. fall risk.  09-09-16   Baseline 40/56    Time 4   Period Weeks   Status New     PT SHORT TERM GOAL #4   Title Amb. 350' with rollator with SBA on flat, even surface.  09-09-16   Time 4   Period Weeks   Status New     PT SHORT TERM GOAL #5   Title Independent in HEP for balance and strengthening.  09-09-16   Time 4   Period Weeks   Status New           PT Long Term Goals - 08/09/16 2022/05/13      PT LONG TERM GOAL #1   Title Increase Berg balance test score from 40/56 to >/= 48/56 to decr. fall risk.  10-08-16   Time 8   Period Weeks   Status New     PT LONG TERM GOAL #2   Title Improve TUG score to </= 21 secs with SPC for reduced  fall risk.  10-08-16   Baseline 29.02 secs with SPC   Time 8   Period Weeks   Status New     PT LONG TERM GOAL #3   Title Incr. gait velocity from 1.19 to >/= 1.7 ft/sec with SPC for incr. gait efficiency.  10-08-16   Time 8   Period Weeks   Status New     PT LONG TERM GOAL #4   Title Amb. 500' with rollator with SBA on flat, even surface.  10-08-16   Time 8   Period Weeks   Status New     PT LONG TERM GOAL #5   Title Independent in updated HEP.  10-08-16   Time 8   Period Weeks   Status New             Patient will benefit from skilled therapeutic intervention in order to improve the following deficits and impairments:     Visit Diagnosis: Other abnormalities of gait and mobility       G-Codes - 2016-10-10 05/12/2156    Functional Assessment Tool Used (Outpatient Only) --      Problem List Patient Active Problem List   Diagnosis Date Noted  . Malignant tumor of trigone of bladder ([place]) 09/18/2015  . OSA (obstructive sleep apnea) 12/30/2014  . Closed wedge compression fracture of second lumbar vertebra ([place])   . DIARRHEA 04/17/2010    Alda Lea, PT 09/28/2016, 10:02 PM  [facility] [address] [place], [place], [address] Phone: [phone]   Fax:  [phone]  Name: Tara Ingram MRN: [id number] Date of Birth: 1945-10-15

## 2016-09-29 NOTE — Therapy (Signed)
[facility] [address] [place], [place], [address] Phone: [phone]   Fax:  [phone]  Physical Therapy Treatment  Patient Details  Name: Tara Ingram MRN: [id number] Date of Birth: April 15, 1945 Referring Provider: Dr. Anda Kraft (will be seeing Dr. Shelia Media)  Encounter Date: 09/27/2016      PT End of Session - 09/28/16 2157    Visit Number 7   Number of Visits 17   Date for PT Re-Evaluation 10/08/16   Authorization Type Medicare   Authorization Time Period 08-09-16 - 10-08-16   PT Start Time 1536   PT Stop Time 1620   PT Time Calculation (min) 44 min      Past Medical History:  Diagnosis Date  . Allergy   . Arthritis    hands/feet/back ra  . Bladder cancer ([place])   . Cataract    bilateral  . GERD (gastroesophageal reflux disease)   . Headache   . History of kidney stones 50 yrs ago  . Hyperlipidemia   . Hypertension   . Sleep apnea    uses CPAP every night setting of 12    Past Surgical History:  Procedure Laterality Date  . APPENDECTOMY    . BACK SURGERY  05/2014   lower back for crushed disc  . CHOLECYSTECTOMY    . COLONOSCOPY  12/2011   hx polyps/jacobs  . CORNEAL TRANSPLANT     bilateral  . CYSTOSCOPY W/ RETROGRADES Bilateral 09/18/2015   Procedure: BILATERAL RETROGRADE PYELOGRAM;  Surgeon: Ardis Hughs, MD;  [facility];  Service: Urology;  Laterality: Bilateral;  . CYSTOSCOPY W/ RETROGRADES Right 10/17/2015   Procedure: CYSTOSCOPY WITH RETROGRADE PYELOGRAM;  Surgeon: Ardis Hughs, MD;  [facility];  Service: Urology;  Laterality: Right;  . EYE SURGERY Bilateral    lens replacement for cataract  . FOOT SURGERY Left 6 yrs ago   tammer toe reconstruction  . PARTIAL HYSTERECTOMY    . TONSILLECTOMY    . TRANSURETHRAL RESECTION OF BLADDER TUMOR N/A 09/18/2015   Procedure: TRANSURETHRAL RESECTION OF BLADDER TUMOR (TURBT);  Surgeon: Ardis Hughs, MD;  [facility];  Service: Urology;  Laterality: N/A;  . TRANSURETHRAL RESECTION OF BLADDER TUMOR N/A 10/17/2015   Procedure: TRANSURETHRAL RESECTION OF BLADDER TUMOR (TURBT);  Surgeon: Ardis Hughs, MD;  [facility];  Service: Urology;  Laterality: N/A;    There were no vitals filed for this visit.      Subjective Assessment - 09/28/16 2156    Subjective Pt states she has lumbar puncture done on Friday - wants to hold PT until after this procedure to see exactly what her diagnosis is   Pertinent History Bladder cancer Aug. 2017; compression fracture in April 2016 due to fall with surgery   Patient Stated Goals Improve walking - "I want to walk right"   Currently in Pain? No/denies                         [facility] Adult PT Treatment/Exercise - 09/29/16 0001      Ambulation/Gait   Ambulation/Gait Yes   Ambulation/Gait Assistance 5: Supervision   Ambulation Distance (Feet) 100 Feet   Assistive device Rolling walker   Gait Pattern Decreased stride length;Decreased step length - right;Decreased step length - left;Decreased dorsiflexion - right;Decreased dorsiflexion - left;Shuffle;Decreased trunk rotation   Ambulation Surface Level;Indoor     Pt performed standing hamstring stretch (runner's stretch) and heel cord stretch -  30 sec hold with each leg x 1 rep each        Balance Exercises - 09/29/16 1925      Balance Exercises: Standing   Sidestepping Upper extremity support;2 reps  inside // bars   Other Standing Exercises Reviewed forward, back and side kicks 10 reps each leg with UE support             PT Short Term Goals - 08/09/16 1946      PT SHORT TERM GOAL #1   Title Improve TUG score to </= 25 secs with SPC for improved functional mobility/reduced fall risk.  09-09-16   Baseline 29.02 secs with SPC   Time 4   Period Weeks   Status New     PT SHORT TERM GOAL #2   Title Incr. gait velocity to >/= 1.4 ft/sec with SPC for incr. gait efficiency.  09-09-16    Baseline 27.62 secs = 1.19 ft/sec   Time 4   Period Weeks   Status New     PT SHORT TERM GOAL #3   Title Improve Berg score to >/= 44/56 to decr. fall risk.  09-09-16   Baseline 40/56    Time 4   Period Weeks   Status New     PT SHORT TERM GOAL #4   Title Amb. 350' with rollator with SBA on flat, even surface.  09-09-16   Time 4   Period Weeks   Status New     PT SHORT TERM GOAL #5   Title Independent in HEP for balance and strengthening.  09-09-16   Time 4   Period Weeks   Status New           PT Long Term Goals - 08/09/16 2024      PT LONG TERM GOAL #1   Title Increase Berg balance test score from 40/56 to >/= 48/56 to decr. fall risk.  10-08-16   Time 8   Period Weeks   Status New     PT LONG TERM GOAL #2   Title Improve TUG score to </= 21 secs with SPC for reduced fall risk.  10-08-16   Baseline 29.02 secs with SPC   Time 8   Period Weeks   Status New     PT LONG TERM GOAL #3   Title Incr. gait velocity from 1.19 to >/= 1.7 ft/sec with SPC for incr. gait efficiency.  10-08-16   Time 8   Period Weeks   Status New     PT LONG TERM GOAL #4   Title Amb. 500' with rollator with SBA on flat, even surface.  10-08-16   Time 8   Period Weeks   Status New     PT LONG TERM GOAL #5   Title Independent in updated HEP.  10-08-16   Time 8   Period Weeks   Status New               Plan - 09/29/16 1856    Clinical Impression Statement Pt continues to exhibit gait deviations including decreased step length bilaterally with shuffling at times; pt able to increase step length for initial 10'-15'; pt wishes to HOLD PT unitl after lumbar puncture procedure is done on 10-01-16 for further diagnostic work up of etiology of gait disorder   Rehab Potential Good   PT Frequency 2x / week   PT Duration 8 weeks   PT Treatment/Interventions ADLs/Self Care Home Management;DME Instruction;Gait training;Stair training;Functional mobility training;Therapeutic activities;Therapeutic  exercise;Balance training;Neuromuscular re-education;Patient/family education   PT Next Visit Plan Pt requests to be placed on HOLD   PT Home Exercise Plan see above   Consulted and Agree with Plan of Care Patient      Patient will benefit from skilled therapeutic intervention in order to improve the following deficits and impairments:  Abnormal gait, Decreased balance, Decreased knowledge of use of DME, Decreased strength, Pain  Visit Diagnosis: Other abnormalities of gait and mobility     Problem List Patient Active Problem List   Diagnosis Date Noted  . Malignant tumor of trigone of bladder ([place]) 09/18/2015  . OSA (obstructive sleep apnea) 12/30/2014  . Closed wedge compression fracture of second lumbar vertebra ([place])   . DIARRHEA 04/17/2010    Alda Lea, PT 09/29/2016, 7:28 PM  [facility] [address] [place], [place], [address] Phone: [phone]   Fax:  [phone]  Name: Tara Ingram MRN: [id number] Date of Birth: [birth year]

## 2016-09-30 ENCOUNTER — Ambulatory Visit: Payer: Medicare Other | Admitting: Physical Therapy

## 2016-10-01 ENCOUNTER — Ambulatory Visit
Admission: RE | Admit: 2016-10-01 | Discharge: 2016-10-01 | Disposition: A | Discharge status: Home / Self Care | Payer: Medicare Other | Source: Ambulatory Visit | Attending: Neurology | Admitting: Neurology

## 2016-10-01 VITALS — BP 171/63 | HR 72

## 2016-10-01 DIAGNOSIS — G992 Myelopathy in diseases classified elsewhere: Secondary | ICD-10-CM

## 2016-10-01 DIAGNOSIS — G912 (Idiopathic) normal pressure hydrocephalus: Secondary | ICD-10-CM

## 2016-10-01 DIAGNOSIS — M4802 Spinal stenosis, cervical region: Secondary | ICD-10-CM

## 2016-10-01 DIAGNOSIS — G1221 Amyotrophic lateral sclerosis: Secondary | ICD-10-CM

## 2016-10-01 DIAGNOSIS — G1229 Other motor neuron disease: Secondary | ICD-10-CM

## 2016-10-01 LAB — CSF CELL COUNT WITH DIFFERENTIAL
RBC Count, CSF: 0 cells/uL (ref 0–10)
WBC CSF: 0 {cells}/uL (ref 0–5)

## 2016-10-01 LAB — PROTEIN, CSF: Total Protein, CSF: 32 mg/dL (ref 15–60)

## 2016-10-01 LAB — GLUCOSE, CSF: Glucose, CSF: 56 mg/dL (ref 43–76)

## 2016-10-01 NOTE — Discharge Instructions (Signed)

## 2016-10-02 ENCOUNTER — Telehealth: Payer: Self-pay | Admitting: Neurology

## 2016-10-02 NOTE — Telephone Encounter (Signed)
Spoke with patient, she is doing well after high volume Tap. She is going to walk and try to judge improvement.   Erasmo Downer, can you give her a call on Tuesday and see how she is doing and let me know? Looking for a drastic improvement in walking and also would like to try to squeeze her in next week if possible. If not with me then with an Np so I can see her walk, thanks

## 2016-10-05 ENCOUNTER — Ambulatory Visit: Payer: Medicare Other | Admitting: Physical Therapy

## 2016-10-05 NOTE — Progress Notes (Signed)
[facility]  PATIENT: Tara Ingram DOB: Jun 14, 1945   REASON FOR VISIT: Follow-up for shuffling gait  HISTORY FROM: Patient and husband    HISTORY OF PRESENT ILLNESS:UPDATE 9/5/2018CM . Mr.Digilio, 71 year old female returns for follow-up. She has a gait abnormality. She had an LP on 10/01/2016 and  her walking improved but her gait is gradually getting back to where it was. CSF fluid without abnormalities.  She ambulates with a single-point cane. She denies any recent falls. 09/03/16 Abnormal  MRI scan of the brain showing mild ventriculomegaly out of proportion to the degree of cortical and central atrophy noted indicative of possible communicating hydrocephalus. Moderate changes of age-appropriate chronic microvascular ischemia and mild degree of generalized cerebral atrophy. No acute abnormality noted. She also had an MRI of the cervical and thoracic spine which showed degenerative arthritic changes but no major findings. Patient admits to decreased smell for several years. There is also a history of Parkinson's disease in her family. She denies any difficulty with sleeping no vivid dreams no drooling. She denies any difficulty swallowing or hallucinations. She returns for reevaluation  HISTORY 08/10/16 Tara Ingram is a 71 y.o. female here as a referral from Dr. Wilson Singer for shuffling gait. Here with her husband who also provides information.  Past medical history of sleep apnea, hypertension, hyperlipidemia, kidney stones, headache, GERD, bladder cancer, arthritis, allergy. Patient fell in 2016 and has not gotten over it since she fell, she fell on her deck and crushed a vertebrae in her back and since then she has been walking differently. Walking has about the same, not better. Her walking is slow. She has chronic back pain. She shuffles, moves the left leg forward and drags the other one. She has pain when she walks and she drags the right foot. She has pain when  standing in her back. Before the fall she was walking fine and after the fall her gait changed. She has postural tremors that she has had for years. No resting tremor. Volume of voice not significantly changed but speaks more softly. She denies any dreams or trouble with sleeping.No vivid dreams. No wet pillows or drooling. She has decreased smell for 4-5 years. No urinary incontinence but has difficulty since bladder cancer. She has no difficulty swallowing, handwriting has worsened not small but messy. Denies hallucinations. Mother, grandmother and sister has Parkinson's disease. Feels imbalanced.   REVIEW OF SYSTEMS: Full 14 system review of systems performed and notable only for those listed, all others are neg:  Constitutional:Fatigue  Cardiovascular: neg Ear/Nose/Throat: neg  Skin: neg Eyes: neg Respiratory: neg Gastroitestinal:Urinary frequency  Hematology/Lymphatic: neg  Endocrine: neg Musculoskeletal Joint pain, back pain walking difficulty  Allergy/Immunology: neg Neurological: neg Psychiatric: neg Sleep : neg   ALLERGIES: No Known Allergies  HOME MEDICATIONS: Outpatient Medications Prior to Visit  Medication Sig Dispense Refill  . celecoxib (CELEBREX) 200 MG capsule Take 200 mg by mouth as needed.   2  . cholestyramine (QUESTRAN) 4 g packet MIX 1 PACKET (4 GM TOTAL) AND TAKE BY MOUTH TWICE DAILY. (Patient taking differently: MIX 1 PACKET (4 GM TOTAL) AND TAKE BY MOUTH DAILY.) 60 each 11  . diclofenac sodium (VOLTAREN) 1 % GEL Apply 1 application topically 2 (two) times daily.   5  . diphenhydrAMINE (BENADRYL) 25 MG tablet Take 25 mg by mouth 3 (three) times daily as needed for itching or allergies.     Marland Kitchen leflunomide (ARAVA) 20 MG tablet Take 20 mg by mouth daily.    Marland Kitchen  metoCLOPramide (REGLAN) 5 MG tablet Take 2-3 times daily as needed for nausea. (Patient taking differently: Take 5 mg by mouth 3 (three) times daily as needed for nausea or vomiting. Take 2-3 times daily as  needed for nausea.) 50 tablet 3  . mirabegron ER (MYRBETRIQ) 50 MG TB24 tablet Take 50 mg by mouth daily.    Earney Navy Bicarbonate (ZEGERID OTC PO) Take 1 capsule by mouth daily as needed (heartburn/acid reflux). Reported on 04/08/2015    . oxyCODONE-acetaminophen (PERCOCET/ROXICET) 5-325 MG tablet   0  . tiZANidine (ZANAFLEX) 2 MG tablet Take 2 mg by mouth at bedtime.   2  . valsartan (DIOVAN) 320 MG tablet Take 320 mg by mouth daily.    Marland Kitchen HYDROcodone-acetaminophen (NORCO) 10-325 MG per tablet Take 1 tablet by mouth every 6 (six) hours as needed for moderate pain.    . Meth-Hyo-M Bl-Na Phos-Ph Sal (URIBEL) 118 MG CAPS Take 1 capsule (118 mg total) by mouth 4 (four) times daily as needed. 30 capsule 1   No facility-administered medications prior to visit.     PAST MEDICAL HISTORY: Past Medical History:  Diagnosis Date  . Allergy   . Arthritis    hands/feet/back ra  . Bladder cancer ([place])   . Cataract    bilateral  . GERD (gastroesophageal reflux disease)   . Headache   . History of kidney stones 50 yrs ago  . Hyperlipidemia   . Hypertension   . Sleep apnea    uses CPAP every night setting of 12    PAST SURGICAL HISTORY: Past Surgical History:  Procedure Laterality Date  . APPENDECTOMY    . BACK SURGERY  05/2014   lower back for crushed disc  . CHOLECYSTECTOMY    . COLONOSCOPY  12/2011   hx polyps/jacobs  . CORNEAL TRANSPLANT     bilateral  . CYSTOSCOPY W/ RETROGRADES Bilateral 09/18/2015   Procedure: BILATERAL RETROGRADE PYELOGRAM;  Surgeon: Ardis Hughs, MD;  [facility];  Service: Urology;  Laterality: Bilateral;  . CYSTOSCOPY W/ RETROGRADES Right 10/17/2015   Procedure: CYSTOSCOPY WITH RETROGRADE PYELOGRAM;  Surgeon: Ardis Hughs, MD;  [facility];  Service: Urology;  Laterality: Right;  . EYE SURGERY Bilateral    lens replacement for cataract  . FOOT SURGERY Left 6 yrs ago   tammer toe reconstruction  . PARTIAL HYSTERECTOMY    .  TONSILLECTOMY    . TRANSURETHRAL RESECTION OF BLADDER TUMOR N/A 09/18/2015   Procedure: TRANSURETHRAL RESECTION OF BLADDER TUMOR (TURBT);  Surgeon: Ardis Hughs, MD;  [facility];  Service: Urology;  Laterality: N/A;  . TRANSURETHRAL RESECTION OF BLADDER TUMOR N/A 10/17/2015   Procedure: TRANSURETHRAL RESECTION OF BLADDER TUMOR (TURBT);  Surgeon: Ardis Hughs, MD;  [facility];  Service: Urology;  Laterality: N/A;    FAMILY HISTORY: Family History  Problem Relation Age of Onset  . Colon cancer Sister 28  . Allergies Mother   . Heart disease Mother   . Rheumatologic disease Mother   . Allergies Father   . Asthma Father   . Heart disease Father   . Esophageal cancer Neg Hx   . Colon polyps Neg Hx   . Rectal cancer Neg Hx   . Stomach cancer Neg Hx     SOCIAL HISTORY: Social History   Social History  . Marital status: Married    Spouse name: Jeneen Rinks  . Number of children: 0  . Years of education: 12   Occupational History  .  Retired    Social History Main Topics  . Smoking status: Never Smoker  . Smokeless tobacco: Never Used  . Alcohol use No  . Drug use: No  . Sexual activity: Yes    Birth control/ protection: Surgical     Comment: hysterectomy   Other Topics Concern  . Not on file   Social History Narrative   Lives at home w/ her husband   Right-handed   Caffeine: 1+ glasses of tea per day     PHYSICAL EXAM  Vitals:   10/06/16 0958  BP: 113/67  Pulse: 87  Weight: 153 lb 9.6 oz (69.7 kg)   Body mass index is 26.78 kg/m.  Generalized: Well developed, in no acute distress , no masking of face Head: normocephalic and atraumatic,. Oropharynx benign  Neck: Supple,  Cardiac: Regular rate rhythm, no murmur  Musculoskeletal: No deformity   Neurological examination   Mentation: Alert oriented to time, place, history taking. Attention span and concentration appropriate. Recent and remote memory intact.  Follows all commands speech and  language fluent.   Cranial nerve II-XII: .Pupils were equal round reactive to light extraocular movements were full, visual field were full on confrontational test. Facial sensation and strength were normal. hearing was intact to finger rubbing bilaterally. Uvula tongue midline. head turning and shoulder shrug were normal and symmetric.Tongue protrusion into cheek strength was normal. Motor: normal bulk and tone, full strength in the BUE, BLE, mild bradykinesia on finger taps, cogwheeling to right wrist  Sensory: normal and symmetric to light touch, pinprick, and  Vibration, in the upper and lower extremities  Reflexes: Brachioradialis 2/2, biceps 2/2, triceps 2/2, patellar 2/2, Achilles 2/2, plantar responses were flexor bilaterally. Gait and Station: Rising up from seated position with arms crossed can stand after 3 attempts. Waddling gait, ambulated 200 feet in 2 minutes. Bilateral decreased arm swing. Right resting tremor noted when walking  DIAGNOSTIC DATA (LABS, IMAGING, TESTING) - I reviewed patient records, labs, notes, testing and imaging myself where available.  Lab Results  Component Value Date   WBC 6.8 09/16/2015   HGB 11.3 (L) 09/19/2015   HCT 35.1 (L) 09/19/2015   MCV 94.7 09/16/2015   PLT 212 09/16/2015      Component Value Date/Time   NA 141 09/19/2015 0530   K 4.2 09/19/2015 0530   CL 110 09/19/2015 0530   CO2 26 09/19/2015 0530   GLUCOSE 112 (H) 09/19/2015 0530   BUN 13 09/19/2015 0530   CREATININE 0.83 09/19/2015 0530   CALCIUM 8.5 (L) 09/19/2015 0530   GFRNONAA >60 09/19/2015 0530   GFRAA >60 09/19/2015 0530    ASSESSMENT AND PLAN  71 year old with parkinsonism. Exam shows waddling  gait, decreased arm swing, re-emergent tremor of the right hand, cogwheeling increased tone of the right upper extremity,  Patient reports hypophonia and loss of smell. Concerning for Parkinson's disease however patient had a significant fall several years ago with fracture of the  spine and reports her shuffling gait started immediately afterwards which is not consistent with Parkinson's disease. Gait improved after LP  PLAN: Discussed with Dr. Jaynee Eagles  DAT scan scheduled Will make referral to neurosurgery for questionable shunt Pt may be placed on Sinemet after DAT scan.  F/U 4 weeks with Jaynee Eagles I spent 45 min  in total face to face time with the patient more than 50% of which was spent counseling and coordination of care, reviewing test results reviewing medications and discussing and reviewing the diagnosis of NPH versus  Parkinson's. Importance of keeping appointment for DAT scan and further treatment options. Additional questions answered for patient and husband , Dennie Bible, [facility], [facility], [facility] [address], [place], [place] [address] [phone]

## 2016-10-05 NOTE — Telephone Encounter (Signed)
I called pt. She reports an improvement in her walking, she doesn't think it is a "drastic" improvement, but does report an improvement. I offered pt several appts this week to be worked in for another gait evaluation, but pt has a busy schedule, but was able to get pt in with Hoyle Sauer, NP tomorrow at 10:15am. Pt knows to arrive 15-30 mins early to this appt.

## 2016-10-06 ENCOUNTER — Ambulatory Visit (INDEPENDENT_AMBULATORY_CARE_PROVIDER_SITE_OTHER): Payer: Medicare Other | Admitting: Nurse Practitioner

## 2016-10-06 ENCOUNTER — Encounter: Payer: Self-pay | Admitting: Nurse Practitioner

## 2016-10-06 DIAGNOSIS — R259 Unspecified abnormal involuntary movements: Secondary | ICD-10-CM | POA: Diagnosis not present

## 2016-10-06 DIAGNOSIS — G252 Other specified forms of tremor: Secondary | ICD-10-CM | POA: Insufficient documentation

## 2016-10-06 DIAGNOSIS — R269 Unspecified abnormalities of gait and mobility: Secondary | ICD-10-CM

## 2016-10-06 NOTE — Patient Instructions (Signed)
DAT scan scheduled Will make referral to neurosurgery  F/U 4 weeks with Jaynee Eagles

## 2016-10-07 ENCOUNTER — Ambulatory Visit: Payer: Medicare Other | Admitting: Physical Therapy

## 2016-10-09 NOTE — Progress Notes (Signed)
Personally  participated in, made any corrections needed, and agree with history, physical, neuro exam,assessment and plan as stated.     Antonia Ahern, MD [facility]     

## 2016-10-14 ENCOUNTER — Encounter (HOSPITAL_COMMUNITY)
Admission: RE | Admit: 2016-10-14 | Discharge: 2016-10-14 | Disposition: A | Discharge status: Home / Self Care | Payer: Medicare Other | Source: Ambulatory Visit | Attending: Neurology | Admitting: Neurology

## 2016-10-14 DIAGNOSIS — R269 Unspecified abnormalities of gait and mobility: Secondary | ICD-10-CM | POA: Diagnosis not present

## 2016-10-14 DIAGNOSIS — G20 Parkinson's disease: Secondary | ICD-10-CM | POA: Diagnosis not present

## 2016-10-14 MED ORDER — IOFLUPANE I 123 185 MBQ/2.5ML IV SOLN
4.9000 | Freq: Once | INTRAVENOUS | Status: AC
  Administered 2016-10-14: 4.9 via INTRAVENOUS

## 2016-10-14 MED ORDER — IODINE STRONG (LUGOLS) 5 % PO SOLN
0.8000 mL | Freq: Once | ORAL | Status: AC
  Administered 2016-10-14: 0.8 mL via ORAL

## 2016-10-18 ENCOUNTER — Other Ambulatory Visit: Payer: Self-pay | Admitting: Neurology

## 2016-10-18 ENCOUNTER — Telehealth: Payer: Self-pay | Admitting: Neurology

## 2016-10-18 DIAGNOSIS — G20 Parkinson's disease: Secondary | ICD-10-CM

## 2016-10-18 MED ORDER — CARBIDOPA-LEVODOPA 25-100 MG PO TABS: ORAL_TABLET | ORAL | 2 refills | Status: DC

## 2016-10-18 NOTE — Telephone Encounter (Signed)
Called and discussed. Her walking difficulty likely due to parkinson's disease. She still wants to explore NPH because she did improve however did not improve significantly so I think this is likely PD and not NPH.

## 2016-10-18 NOTE — Telephone Encounter (Signed)
Called patient's home and cell, no answer. Left VM on cell phone. Will try again to discuss results.   PET Scan: Mild decreased relative radiotracer accumulation within the RIGHT putamen compared to the LEFT could indicate a mild or early Parkinson's syndrome pattern.

## 2016-10-19 NOTE — Telephone Encounter (Signed)
Patient says medication that was called in yesterday carbidopa-levodopa (SINEMET IR) 25-100 MG tablet is not at pharmacy. Please resend to [facility] [address].

## 2016-10-20 ENCOUNTER — Other Ambulatory Visit: Payer: Self-pay | Admitting: Neurology

## 2016-10-20 DIAGNOSIS — G20 Parkinson's disease: Secondary | ICD-10-CM

## 2016-10-20 MED ORDER — CARBIDOPA-LEVODOPA 25-100 MG PO TABS: ORAL_TABLET | ORAL | 2 refills | Status: DC

## 2016-10-20 NOTE — Telephone Encounter (Signed)
I have reprinted and we will re send again today.

## 2016-10-29 ENCOUNTER — Telehealth: Payer: Self-pay | Admitting: Nurse Practitioner

## 2016-10-29 NOTE — Telephone Encounter (Signed)
Called Tara Ingram at [facility], and while on phone Dr Jaynee Eagles stated to cancel the referral. [facility] Dr Jaynee Eagles has cancelled the referral;l; she stated she would make note and cancel.  Called patient and advised her of referral cancelled. Stated that Dr Jaynee Eagles will see her for follow up 11/06/16 and advise next steps.  Patient verbalized understanding, appreciation.

## 2016-10-29 NOTE — Telephone Encounter (Signed)
Regina with [facility] call office in reference to referral they received for patient to be seen in their office.  Rollene Fare seen that patient had back surgery in 2016 and is trying to find out who preformed the surgery and needing Op reports.  Please call Rollene Fare at [phone] hit "[phone]" and ask for her.

## 2016-11-03 DIAGNOSIS — S32000D Wedge compression fracture of unspecified lumbar vertebra, subsequent encounter for fracture with routine healing: Secondary | ICD-10-CM | POA: Diagnosis not present

## 2016-11-03 DIAGNOSIS — M0579 Rheumatoid arthritis with rheumatoid factor of multiple sites without organ or systems involvement: Secondary | ICD-10-CM | POA: Diagnosis not present

## 2016-11-03 DIAGNOSIS — E663 Overweight: Secondary | ICD-10-CM | POA: Diagnosis not present

## 2016-11-03 DIAGNOSIS — Z6827 Body mass index (BMI) 27.0-27.9, adult: Secondary | ICD-10-CM | POA: Diagnosis not present

## 2016-11-03 DIAGNOSIS — M150 Primary generalized (osteo)arthritis: Secondary | ICD-10-CM | POA: Diagnosis not present

## 2016-11-03 DIAGNOSIS — M810 Age-related osteoporosis without current pathological fracture: Secondary | ICD-10-CM | POA: Diagnosis not present

## 2016-11-03 DIAGNOSIS — Z79899 Other long term (current) drug therapy: Secondary | ICD-10-CM | POA: Diagnosis not present

## 2016-11-03 DIAGNOSIS — M25561 Pain in right knee: Secondary | ICD-10-CM | POA: Diagnosis not present

## 2016-11-08 DIAGNOSIS — M810 Age-related osteoporosis without current pathological fracture: Secondary | ICD-10-CM | POA: Diagnosis not present

## 2016-11-08 DIAGNOSIS — Z23 Encounter for immunization: Secondary | ICD-10-CM | POA: Diagnosis not present

## 2016-11-09 ENCOUNTER — Ambulatory Visit (INDEPENDENT_AMBULATORY_CARE_PROVIDER_SITE_OTHER): Payer: Medicare Other | Admitting: Neurology

## 2016-11-09 ENCOUNTER — Encounter: Payer: Self-pay | Admitting: Neurology

## 2016-11-09 VITALS — BP 128/69 | HR 78 | Ht 64.0 in | Wt 161.2 lb

## 2016-11-09 DIAGNOSIS — G20 Parkinson's disease: Secondary | ICD-10-CM | POA: Diagnosis not present

## 2016-11-09 MED ORDER — ROTIGOTINE 4 MG/24HR TD PT24: 1.0000 | MEDICATED_PATCH | Freq: Every day | TRANSDERMAL | 0 refills | Status: DC

## 2016-11-09 NOTE — Patient Instructions (Signed)
Day one: Place one patch on skin Day two, three and four: Discard patches each morning and Place 2 new  patches on skin  Then use one 4mg  patch daily  Rotigotine transdermal skin patch What is this medicine? ROTIGOTINE (roe TIG oh teen) is used to control the signs and symptoms of Parkinson's disease or restless legs syndrome. This medicine may be used for other purposes; ask your health care provider or pharmacist if you have questions. COMMON BRAND NAME(S): Neupro What should I tell my health care provider before I take this medicine? They need to know if you have any of these conditions: -heart disease -high blood pressure -lung or breathing disease, like asthma -mental illness -skin cancer -sleep disorder -an unusual or allergic reaction to rotigotine, sulfites, other medicines, foods, dyes, or preservatives -pregnant or trying to get pregnant -breast-feeding How should I use this medicine? This medicine is for external use only. Follow the directions on the prescription label. Use exactly as directed. Wash hands after removing and applying this medicine. Change the patch each day at the same time. Apply the patch to an area of the upper arm or body that is clean, dry, and hairless. Do not use this patch on skin that is injured, irritated, oily, or calloused. Do not apply where the patch will be rubbed by tight clothing or a waistband. Do not apply to the same place more than once every 14 days in order to prevent skin irritation. Do not cut or trim the patch. Take your medicine at regular intervals. Do not take it more often than directed. Do not stop taking except on your doctor's advice. Always remove the old patch before you apply a new one. Remove patch slowly and carefully to avoid irritation. After removal, fold the patch so that it sticks to itself and throw it away. After removal of patch, wash the area with soap and water to remove any drug or adhesive. Baby oil or mineral oil may  be used if needed. Do not use alcohol or other liquids. Talk to your pediatrician regarding the use of this medicine in children. Special care may be needed. Overdosage: If you think you have taken too much of this medicine contact a poison control center or emergency room at once. NOTE: This medicine is only for you. Do not share this medicine with others. What if I miss a dose? If you miss a dose, take it as soon as you can. If it is almost time for your next dose, take only that dose. Do not take double or extra doses. What may interact with this medicine? -alcohol -antihistamines for allergy, cough and cold -certain medicines for sleep -medicines for depression, anxiety, or psychotic disturbances -metoclopramide -narcotic medicines for pain This list may not describe all possible interactions. Give your health care provider a list of all the medicines, herbs, non-prescription drugs, or dietary supplements you use. Also tell them if you smoke, drink alcohol, or use illegal drugs. Some items may interact with your medicine. What should I watch for while using this medicine? Visit your doctor for regular check ups. Tell your doctor or healthcare professional if your symptoms do not start to get better or if they get worse. You may get drowsy or dizzy. Do not drive, use machinery, or do anything that needs mental alertness until you know how this medicine affects you. Do not stand or sit up quickly, especially if you are an older patient. This reduces the risk of dizzy or  fainting spells. Alcohol may interfere with the effect of this medicine. Avoid alcoholic drinks. If you find that you have sudden feelings of wanting to sleep during normal activities, like cooking, watching television, or while driving or riding in a car, you should contact your health care professional. There have been reports of increased sexual urges or other strong urges such as gambling while taking this medicine. If you  experience any of these while taking this medicine, you should report this to your health care provider as soon as possible. This medicine patch is sensitive to certain body heat changes. If your skin gets too hot, more medicine will come out of the patch. Call your healthcare provider if you get a fever. Do not take hot baths. Do not sunbathe. Do not use hot tubs, saunas, hair dryers, heating pads, electric blankets, heated waterbeds, or tanning lamps. Do not do exercise that increases your body temperature. If you are going to have a magnetic resonance imaging (MRI) procedure, tell your MRI technician if you have this patch on your body. It must be removed before a MRI. What side effects may I notice from receiving this medicine? Side effects that you should report to your doctor or health care professional as soon as possible: -allergic reactions like skin rash, itching or hives, swelling of the face, lips, or tongue -anxiety, restlessness -breathing problems -confusion -dizziness -falling asleep during normal activities like driving -fast, irregular or slow heartbeat -feeling faint or lightheaded, falls -hallucination, loss of contact with reality -skin irritation, redness, or swelling -uncontrollable head, mouth, neck, arm, or leg movements -uncontrollable and excessive urges (examples: gambling, binge eating, shopping, having sex) Side effects that usually do not require medical attention (report to your doctor or health care professional if they continue or are bothersome): -constipation -difficulty sleeping -headache -loss of appetite -nausea, vomiting -stomach pain -weight gain This list may not describe all possible side effects. Call your doctor for medical advice about side effects. You may report side effects to FDA at 1-800-FDA-1088. Where should I keep my medicine? Keep out of the reach of children. Store at room temperature between 15 and 30 degrees C (59 and 86 degrees  F). Keep container tightly closed. Store in original pouch until just before use. Throw away any unused medicine after the expiration date. NOTE: This sheet is a summary. It may not cover all possible information. If you have questions about this medicine, talk to your doctor, pharmacist, or health care provider.  2018 Elsevier/Gold Standard (2015-08-29 15:25:23)

## 2016-11-09 NOTE — Progress Notes (Signed)
[facility]    Provider:  Dr Jaynee Eagles Referring Provider: Anda Kraft, MD Primary Care Physician:  Anda Kraft, MD   CC:  Shuffling gait  Interval history 11/09/2016: 71 year old female who returns today after a positive DAT scan which indicates a parkinsonian disorder. High volume lumbar tap was conducted due to possible normal pressure hydrocephalus on MRI but did not significantly improve symptoms. Likely Parkinson's Disease.  She feels the medication is helping. She takes the medication at 8, noon and 4pm. She has stopped driving. She has a lot of hip pain and this complicates the treatment.   HPI:  Tara Ingram is a 71 y.o. female here as a referral from Dr. Wilson Singer for shuffling gait. Here with her husband who also provides information.  Past medical history of sleep apnea, hypertension, hyperlipidemia, kidney stones, headache, GERD, bladder cancer, arthritis, allergy. Patient fell in 2016 and has not gotten over it since she fell, she fell on her deck and crushed a vertebrae in her back and since then she has been walking differently. Walking has about the same, not better. Her walking is slow. She has chronic back pain. She shuffles, moves the left leg forward and drags the other one. She has pain when she walks and she drags the right foot. She has pain when standing in her back. Before the fall she was walking fine and after the fall her gait changed. She has postural tremors that she has had for years. No resting tremor. Volume of voice not significantly changed but speaks more softly. She denies any dreams or trouble with sleeping.No vivid dreams. No wet pillows or drooling. She has decreased smell for 4-5 years. No urinary incontinence but has difficulty since bladder cancer. She has no difficulty swallowing, handwriting has worsened not small but messy. Denies hallucinations. Mother, grandmother and sister has Parkinson's disease. Feels imbalanced.      Reviewed notes, labs and imaging from outside physicians, which showed:   Non-compressive disc bulges at T10-11, T11-12 and T12-L1.  L1-2: Bulging of the disc. Mild posterior bowing of the posterosuperior endplate of L2 related to the old fracture. Mild stenosis of both lateral recesses at this level without definite neural compression. No evidence of un healed component at the L2 level.  L2-3: Bulging of the disc. Bilateral facet arthropathy. No central canal stenosis. Facet arthropathy could be symptomatic. Mild foraminal stenosis on the left.  L3-4: Mild bulging of the disc. Mild facet hypertrophy. No compressive stenosis.  L4-5: Advanced bilateral facet arthropathy with anterolisthesis of 7 mm. Mild bulging of the disc. Narrowing of the lateral recesses that could cause neural compression. Facet arthropathy on the left shows edema and enhancement which could be symptomatic.  L5-S1: Advanced bilateral facet arthropathy with anterolisthesis of 6 mm. Bulging of the disc. Mild stenosis of the subarticular lateral recesses and neural foramina without visible neural compression.Facet edema and enhancement right more than left which could be symptomatic.  No evidence of sacral fracture down through S3.  IMPRESSION: Old healed augmented fracture at L2. No evidence of progression or residual edema.  Degenerative facet arthritis at L2-3 left more than right probably because of the altered biomechanics. This could be a cause of left-sided back pain. Some left foraminal narrowing could possibly affect the L2 nerve root.  Facet arthropathy at L4-5 with 7 mm of anterolisthesis. Stenosis of both lateral recesses. Facet edema and enhancement on the left which could be associated with pain.  Facet arthropathy at L5-S1 with 6  mm of anterolisthesis. Mild subarticular lateral recess narrowing bilaterally. Facet edema and enhancement right more than left could be associated with low  back pain.  Review of labs from [facility] which included unremarkable CBC, unremarkable CMP with BUN 14 and creatinine 0.8 both labs were drawn 10/16/2015, TSH 4.28  Review of Systems: Patient complains of symptoms per HPI as well as the following symptoms: Weakness, tremors, memory loss, joint pain, back pain, walking difficulty, itching. Pertinent negatives and positives per HPI. All others negative.   Social History   Social History  . Marital status: Married    Spouse name: Jeneen Rinks  . Number of children: 0  . Years of education: 12   Occupational History  . Retired    Social History Main Topics  . Smoking status: Never Smoker  . Smokeless tobacco: Never Used  . Alcohol use No  . Drug use: No  . Sexual activity: Yes    Birth control/ protection: Surgical     Comment: hysterectomy   Other Topics Concern  . Not on file   Social History Narrative   Lives at home w/ her husband   Right-handed   Caffeine: 3-4 glasses daily    Family History  Problem Relation Age of Onset  . Colon cancer Sister 50  . Allergies Mother   . Heart disease Mother   . Rheumatologic disease Mother   . Allergies Father   . Asthma Father   . Heart disease Father   . Esophageal cancer Neg Hx   . Colon polyps Neg Hx   . Rectal cancer Neg Hx   . Stomach cancer Neg Hx     Past Medical History:  Diagnosis Date  . Allergy   . Arthritis    hands/feet/back ra  . Bladder cancer ([place])   . Cataract    bilateral  . GERD (gastroesophageal reflux disease)   . Headache   . History of kidney stones 50 yrs ago  . Hyperlipidemia   . Hypertension   . Sleep apnea    uses CPAP every night setting of 12    Past Surgical History:  Procedure Laterality Date  . APPENDECTOMY    . BACK SURGERY  05/2014   lower back for crushed disc  . CHOLECYSTECTOMY    . COLONOSCOPY  12/2011   hx polyps/jacobs  . CORNEAL TRANSPLANT     bilateral  . CYSTOSCOPY W/ RETROGRADES Bilateral  09/18/2015   Procedure: BILATERAL RETROGRADE PYELOGRAM;  Surgeon: Ardis Hughs, MD;  Location: [facility];  Service: Urology;  Laterality: Bilateral;  . CYSTOSCOPY W/ RETROGRADES Right 10/17/2015   Procedure: CYSTOSCOPY WITH RETROGRADE PYELOGRAM;  Surgeon: Ardis Hughs, MD;  Location: [facility];  Service: Urology;  Laterality: Right;  . EYE SURGERY Bilateral    lens replacement for cataract  . FOOT SURGERY Left 6 yrs ago   tammer toe reconstruction  . PARTIAL HYSTERECTOMY    . TONSILLECTOMY    . TRANSURETHRAL RESECTION OF BLADDER TUMOR N/A 09/18/2015   Procedure: TRANSURETHRAL RESECTION OF BLADDER TUMOR (TURBT);  Surgeon: Ardis Hughs, MD;  Location: [facility];  Service: Urology;  Laterality: N/A;  . TRANSURETHRAL RESECTION OF BLADDER TUMOR N/A 10/17/2015   Procedure: TRANSURETHRAL RESECTION OF BLADDER TUMOR (TURBT);  Surgeon: Ardis Hughs, MD;  Location: [facility];  Service: Urology;  Laterality: N/A;    Current Outpatient Prescriptions  Medication Sig Dispense Refill  . carbidopa-levodopa (SINEMET IR) 25-100 MG tablet Start with 1/2 tablet three times a  day. In one week increase to a whole pill three times a day. Take 4 hours apart for example 8am, noon and 4pm. Try to separate Sinemet from protein by about 30-60 mins. 90 tablet 2  . celecoxib (CELEBREX) 200 MG capsule Take 200 mg by mouth as needed.   2  . cholestyramine (QUESTRAN) 4 g packet MIX 1 PACKET (4 GM TOTAL) AND TAKE BY MOUTH TWICE DAILY. (Patient taking differently: MIX 1 PACKET (4 GM TOTAL) AND TAKE BY MOUTH DAILY.) 60 each 11  . diclofenac sodium (VOLTAREN) 1 % GEL Apply 1 application topically 2 (two) times daily.   5  . diphenhydrAMINE (BENADRYL) 25 MG tablet Take 25 mg by mouth 3 (three) times daily as needed for itching or allergies.     Marland Kitchen leflunomide (ARAVA) 20 MG tablet Take 20 mg by mouth daily.    Marland Kitchen losartan (COZAAR) 100 MG tablet 100 mg daily.  1  . metoCLOPramide (REGLAN) 5 MG tablet Take 2-3 times daily as  needed for nausea. (Patient taking differently: Take 5 mg by mouth 3 (three) times daily as needed for nausea or vomiting. Take 2-3 times daily as needed for nausea.) 50 tablet 3  . mirabegron ER (MYRBETRIQ) 50 MG TB24 tablet Take 50 mg by mouth daily.    Earney Navy Bicarbonate (ZEGERID OTC PO) Take 1 capsule by mouth daily as needed (heartburn/acid reflux). Reported on 04/08/2015    . oxyCODONE-acetaminophen (PERCOCET/ROXICET) 5-325 MG tablet   0  . tiZANidine (ZANAFLEX) 2 MG tablet Take 2 mg by mouth at bedtime.   2   No current facility-administered medications for this visit.     Allergies as of 11/09/2016  . (No Known Allergies)    Vitals: BP 128/69   Pulse 78   Ht 5\' 4"  (1.626 m)   Wt 161 lb 3.2 oz (73.1 kg)   BMI 27.67 kg/m  Last Weight:  Wt Readings from Last 1 Encounters:  11/09/16 161 lb 3.2 oz (73.1 kg)   Last Height:   Ht Readings from Last 1 Encounters:  11/09/16 5\' 4"  (1.626 m)    Hypovolemia, no acute distress, conversant, speech is normal fluent and spontaneous, patient is oriented to person place time, pupils are equally round and reactive, visual fields full, extraocular movements are intact, trigeminal sensation is intact, face is symmetric, mild bradykinesia on finger taps and decreased amplitude, shuffling gait, reduced arm swing, re-emergent tremor right hand, cogwheeling in the right enhance with facilitation.     Assessment/Plan:  71 year old with parkinsonism. Exam shows shuffling gait, reduced arm swing, re-emergent tremor of the right hand, cogwheeling increased tone of the right upper extremity, brisk reflexes, hypomimia. Patient reports hypophonia and loss of smell. Concerning for Parkinson's disease however patient had a significant fall several years ago with fracture of the spine and reports her shuffling gait started immediately afterwards which is not consistent with Parkinson's disease so need further workup including MRI brain and possibly  DAT scan.   MRI of the brain showed possible normal pressure hydrocephalus however high-volume tap was unremarkable. DAT scan was positive for parkinsonian syndrome Will trial a Neupro patch  Sarina Ill, MD  [facility] [address] [place], [place] [address]  Phone [phone] Fax [phone]  A total of 25 minutes was spent face-to-face with this patient. Over half this time was spent on counseling patient on the parkinson's disease diagnosis and different diagnostic and therapeutic options available.

## 2016-11-16 ENCOUNTER — Telehealth: Payer: Self-pay | Admitting: Neurology

## 2016-11-16 DIAGNOSIS — G20 Parkinson's disease: Secondary | ICD-10-CM | POA: Insufficient documentation

## 2016-11-16 MED ORDER — ROTIGOTINE 4 MG/24HR TD PT24: 1.0000 | MEDICATED_PATCH | Freq: Every day | TRANSDERMAL | 11 refills | Status: DC

## 2016-11-16 NOTE — Telephone Encounter (Signed)
Elmyra Ricks, I never received this telephone note, please make sure you address it to me. I reordered the prescription would you call the pharmacy and ensure they received it? Ive had a few other problems with electronic prescriptions lately. Also, when you call the pharmacy, see if insurance covered the medication and if it didn't we will see if dana can help find [place] a copay card or work with patient and see if there is patient assistance. Or sitch them to the oral. Let me know thanks.

## 2016-11-16 NOTE — Telephone Encounter (Signed)
I tried calling patient back to make her aware that Dr. Jaynee Eagles sent the Rx for Neupro patches on 11/09/16. There was no answer and no voicemail.

## 2016-11-16 NOTE — Telephone Encounter (Signed)
Pt is asking if Dr Jaynee Eagles called in a prescription for Neupro for patches for her.  Pt is asking for a call back please

## 2016-11-17 NOTE — Telephone Encounter (Signed)
I spoke to pharmacist and she did receive the Rx and it is covered under patient's insurance. She said that she called the patient to let her know that the medication will need to be ordered and that her copay is about $200, patient voiced understanding and advised her to go ahead and order the medication. It will arrive tomorrow for patient to pick up.

## 2016-11-17 NOTE — Telephone Encounter (Signed)
There is no patient assistance for NEU PRO.

## 2016-11-17 NOTE — Telephone Encounter (Signed)
$  200 wow. Let patient know we could also send in a cheaper oral formulation. I like th patch but that is pricey - maybe it is just because she is in the donut hole? If so, it may get cheaper in January please find out

## 2016-11-19 NOTE — Telephone Encounter (Signed)
[facility] does have PAP program for Neupro . Patient  bringing  me her proof of insurance on Monday . I will submit form's Thanks [name].

## 2016-11-21 NOTE — Telephone Encounter (Signed)
Place order for 4mg  daily see me if needed for prescription

## 2016-11-22 ENCOUNTER — Other Ambulatory Visit: Payer: Self-pay | Admitting: *Deleted

## 2016-11-22 ENCOUNTER — Other Ambulatory Visit: Payer: Self-pay | Admitting: Neurology

## 2016-11-22 MED ORDER — ROTIGOTINE 4 MG/24HR TD PT24: 1.0000 | MEDICATED_PATCH | Freq: Every day | TRANSDERMAL | 11 refills | Status: DC

## 2016-11-22 NOTE — Addendum Note (Signed)
Addended by: Gildardo Griffes on: 11/22/2016 02:30 PM   Modules accepted: Orders

## 2016-11-22 NOTE — Telephone Encounter (Signed)
Prescription for Neupro 4 mg printed.

## 2016-11-22 NOTE — Telephone Encounter (Signed)
I think Romelle Starcher took acer of this thanks SO much!

## 2016-11-22 NOTE — Telephone Encounter (Signed)
Patient is coming this morning to sign her paper work and.  Dr. Jaynee Eagles I will need a printed RX Please for Neuro and it need's to be for 90 days . And it has to match on the form you have to sign . I will put on Bethany's Desk. Thanks [name].

## 2016-11-24 ENCOUNTER — Other Ambulatory Visit: Payer: Self-pay | Admitting: Urology

## 2016-11-24 DIAGNOSIS — C674 Malignant neoplasm of posterior wall of bladder: Secondary | ICD-10-CM | POA: Diagnosis not present

## 2016-11-24 DIAGNOSIS — N3289 Other specified disorders of bladder: Secondary | ICD-10-CM | POA: Diagnosis not present

## 2016-11-25 DIAGNOSIS — M62830 Muscle spasm of back: Secondary | ICD-10-CM | POA: Diagnosis not present

## 2016-11-25 DIAGNOSIS — G4733 Obstructive sleep apnea (adult) (pediatric): Secondary | ICD-10-CM | POA: Diagnosis not present

## 2016-11-25 DIAGNOSIS — M47817 Spondylosis without myelopathy or radiculopathy, lumbosacral region: Secondary | ICD-10-CM | POA: Diagnosis not present

## 2016-11-25 DIAGNOSIS — M170 Bilateral primary osteoarthritis of knee: Secondary | ICD-10-CM | POA: Diagnosis not present

## 2016-11-25 DIAGNOSIS — M8000XS Age-related osteoporosis with current pathological fracture, unspecified site, sequela: Secondary | ICD-10-CM | POA: Diagnosis not present

## 2016-11-25 DIAGNOSIS — K5900 Constipation, unspecified: Secondary | ICD-10-CM | POA: Diagnosis not present

## 2016-11-25 DIAGNOSIS — Z79891 Long term (current) use of opiate analgesic: Secondary | ICD-10-CM | POA: Diagnosis not present

## 2016-11-25 DIAGNOSIS — G894 Chronic pain syndrome: Secondary | ICD-10-CM | POA: Diagnosis not present

## 2016-11-29 NOTE — Telephone Encounter (Signed)
Sent Patient assistance to see if patient can get approved for Neu pro Patch turn around time 7-10 business days. Telephone [phone] - [phone] [facility] Program.

## 2016-12-08 DIAGNOSIS — C678 Malignant neoplasm of overlapping sites of bladder: Secondary | ICD-10-CM | POA: Diagnosis not present

## 2016-12-08 DIAGNOSIS — R3915 Urgency of urination: Secondary | ICD-10-CM | POA: Diagnosis not present

## 2016-12-08 NOTE — Telephone Encounter (Signed)
Patient was approved for Neupro Patch. 12/06/2016 - 12/06/2017. I have called and left patient a message relaying that she was approved for Neupro patch and they will call her to schedule delivery.  Telephone [phone] - fax [phone].

## 2016-12-08 NOTE — Telephone Encounter (Signed)
Great thank you!

## 2016-12-20 DIAGNOSIS — M8000XS Age-related osteoporosis with current pathological fracture, unspecified site, sequela: Secondary | ICD-10-CM | POA: Diagnosis not present

## 2016-12-20 DIAGNOSIS — G894 Chronic pain syndrome: Secondary | ICD-10-CM | POA: Diagnosis not present

## 2016-12-20 DIAGNOSIS — M62830 Muscle spasm of back: Secondary | ICD-10-CM | POA: Diagnosis not present

## 2016-12-20 DIAGNOSIS — M47817 Spondylosis without myelopathy or radiculopathy, lumbosacral region: Secondary | ICD-10-CM | POA: Diagnosis not present

## 2017-01-05 DIAGNOSIS — R3915 Urgency of urination: Secondary | ICD-10-CM | POA: Diagnosis not present

## 2017-01-05 DIAGNOSIS — R310 Gross hematuria: Secondary | ICD-10-CM | POA: Diagnosis not present

## 2017-01-05 DIAGNOSIS — Z5111 Encounter for antineoplastic chemotherapy: Secondary | ICD-10-CM | POA: Diagnosis not present

## 2017-01-05 DIAGNOSIS — C674 Malignant neoplasm of posterior wall of bladder: Secondary | ICD-10-CM | POA: Diagnosis not present

## 2017-01-12 DIAGNOSIS — Z5111 Encounter for antineoplastic chemotherapy: Secondary | ICD-10-CM | POA: Diagnosis not present

## 2017-01-12 DIAGNOSIS — C678 Malignant neoplasm of overlapping sites of bladder: Secondary | ICD-10-CM | POA: Diagnosis not present

## 2017-01-18 ENCOUNTER — Ambulatory Visit (INDEPENDENT_AMBULATORY_CARE_PROVIDER_SITE_OTHER): Payer: Medicare Other | Admitting: Neurology

## 2017-01-18 ENCOUNTER — Other Ambulatory Visit: Payer: Self-pay | Admitting: Neurology

## 2017-01-18 ENCOUNTER — Encounter (INDEPENDENT_AMBULATORY_CARE_PROVIDER_SITE_OTHER): Payer: Self-pay

## 2017-01-18 ENCOUNTER — Encounter: Payer: Self-pay | Admitting: Neurology

## 2017-01-18 VITALS — BP 126/72 | HR 92 | Ht 63.0 in | Wt 153.0 lb

## 2017-01-18 DIAGNOSIS — G20 Parkinson's disease: Secondary | ICD-10-CM

## 2017-01-18 MED ORDER — ROTIGOTINE 6 MG/24HR TD PT24: 1.0000 | MEDICATED_PATCH | Freq: Every day | TRANSDERMAL | 5 refills | Status: DC

## 2017-01-18 NOTE — Progress Notes (Signed)
[facility]    Provider:  Dr Jaynee Eagles Referring Provider: Anda Kraft, MD Primary Care Physician:  Anda Kraft, MD  CC: Shuffling gait  Interval history 01/18/2017: 71 year old with Parkinson's Dz, + DAT scan. Started on Neupro patch. Also trialed on Sinemet.  She feels better. She has felt a heaviness inher chest and stomach, alkaseltzer helped. She feels like she is taking bigger steps. No dizziness, no falls, tremor is a little better. She still feels her balance is poor.   Interval history 11/09/2016: 71 year old female who returns today after a positive DAT scan which indicates a parkinsonian disorder. High volume lumbar tap was conducted due to possible normal pressure hydrocephalus on MRI but did not significantly improve symptoms. Likely Parkinson's Disease.  She feels the medication is helping. She takes the medication at 8, noon and 4pm. She has stopped driving. She has a lot of hip pain and this complicates the treatment.   QDI:YMEB Tara Ingram a 71 y.o.femalehere as a referral from Dr. Wynonia Hazard shuffling gait. Here with her husband who also provides information. Past medical history of sleep apnea, hypertension, hyperlipidemia, kidney stones, headache, GERD, bladder cancer, arthritis, allergy. Patient fell in 2016 and has not gotten over it since she fell, she fell on her deck and crushed a vertebrae in her back and since then she has been walking differently. Walking has about the same, not better. Her walking is slow. She has chronic back pain. She shuffles, moves the left leg forward and drags the other one. She has pain when she walks and she drags the right foot. She has pain when standing in her back. Before the fall she was walking fine and after the fall her gait changed. She has postural tremors that she has had for years. No resting tremor. Volume of voice not significantly changed but speaks more softly. She denies any dreams or trouble with  sleeping.No vivid dreams. No wet pillows or drooling. She has decreased smell for 4-5 years. No urinary incontinence but has difficulty since bladder cancer. She has no difficulty swallowing, handwriting has worsened not small but messy. Denies hallucinations. Mother, grandmother and sister has Parkinson's disease. Feels imbalanced.    Reviewed notes, labs and imaging from outside physicians, which showed:   Non-compressive disc bulges at T10-11, T11-12 and T12-L1.  L1-2: Bulging of the disc. Mild posterior bowing of the posterosuperior endplate of L2 related to the old fracture. Mild stenosis of both lateral recesses at this level without definite neural compression. No evidence of un healed component at the L2 level.  L2-3: Bulging of the disc. Bilateral facet arthropathy. No central canal stenosis. Facet arthropathy could be symptomatic. Mild foraminal stenosis on the left.  L3-4: Mild bulging of the disc. Mild facet hypertrophy. No compressive stenosis.  L4-5: Advanced bilateral facet arthropathy with anterolisthesis of 7 mm. Mild bulging of the disc. Narrowing of the lateral recesses that could cause neural compression. Facet arthropathy on the left shows edema and enhancement which could be symptomatic.  L5-S1: Advanced bilateral facet arthropathy with anterolisthesis of 6 mm. Bulging of the disc. Mild stenosis of the subarticular lateral recesses and neural foramina without visible neural compression.Facet edema and enhancement right more than left which could be symptomatic.  No evidence of sacral fracture down through S3.  IMPRESSION: Old healed augmented fracture at L2. No evidence of progression or residual edema.  Degenerative facet arthritis at L2-3 left more than right probably because of the altered biomechanics. This could be a  cause of left-sided back pain. Some left foraminal narrowing could possibly affect the L2 nerve root.  Facet arthropathy at L4-5 with  7 mm of anterolisthesis. Stenosis of both lateral recesses. Facet edema and enhancement on the left which could be associated with pain.  Facet arthropathy at L5-S1 with 6 mm of anterolisthesis. Mild subarticular lateral recess narrowing bilaterally. Facet edema and enhancement right more than left could be associated with low back pain.  Review of labs from [facility] which included unremarkable CBC, unremarkable CMP with BUN 14 and creatinine 0.8 both labs were drawn 10/16/2015, TSH 4.28  Review of Systems: Patient complains of symptoms per HPI as well as the following symptoms: Weakness, tremors, memory loss, joint pain, back pain, walking difficulty, itching. Pertinent negatives and positives per HPI. All others negative.  Social History   Socioeconomic History  . Marital status: Married    Spouse name: Tara Ingram  . Number of children: 0  . Years of education: 72  . Highest education level: Not on file  Social Needs  . Financial resource strain: Not on file  . Food insecurity - worry: Not on file  . Food insecurity - inability: Not on file  . Transportation needs - medical: Not on file  . Transportation needs - non-medical: Not on file  Occupational History  . Occupation: Retired  Tobacco Use  . Smoking status: Never Smoker  . Smokeless tobacco: Never Used  Substance and Sexual Activity  . Alcohol use: No    Alcohol/week: 0.0 oz  . Drug use: No  . Sexual activity: Yes    Birth control/protection: Surgical    Comment: hysterectomy  Other Topics Concern  . Not on file  Social History Narrative   Lives at home Tara/ her husband   Right-handed   Caffeine: 3-4 glasses daily    Family History  Problem Relation Age of Onset  . Colon cancer Sister 62  . Allergies Mother   . Heart disease Mother   . Rheumatologic disease Mother   . Allergies Father   . Asthma Father   . Heart disease Father   . Esophageal cancer Neg Hx   . Colon polyps Neg Hx   .  Rectal cancer Neg Hx   . Stomach cancer Neg Hx     Past Medical History:  Diagnosis Date  . Allergy   . Arthritis    hands/feet/back ra  . Bladder cancer ([place])   . Cataract    bilateral  . GERD (gastroesophageal reflux disease)   . Headache   . History of kidney stones 50 yrs ago  . Hyperlipidemia   . Hypertension   . Sleep apnea    uses CPAP every night setting of 12    Past Surgical History:  Procedure Laterality Date  . APPENDECTOMY    . BACK SURGERY  05/2014   lower back for crushed disc  . CHOLECYSTECTOMY    . COLONOSCOPY  12/2011   hx polyps/jacobs  . CORNEAL TRANSPLANT     bilateral  . CYSTOSCOPY Tara/ RETROGRADES Bilateral 09/18/2015   Procedure: BILATERAL RETROGRADE PYELOGRAM;  Surgeon: Ardis Hughs, MD;  Location: [facility];  Service: Urology;  Laterality: Bilateral;  . CYSTOSCOPY Tara/ RETROGRADES Right 10/17/2015   Procedure: CYSTOSCOPY WITH RETROGRADE PYELOGRAM;  Surgeon: Ardis Hughs, MD;  Location: [facility];  Service: Urology;  Laterality: Right;  . EYE SURGERY Bilateral    lens replacement for cataract  . FOOT SURGERY Left 6 yrs ago  tammer toe reconstruction  . PARTIAL HYSTERECTOMY    . TONSILLECTOMY    . TRANSURETHRAL RESECTION OF BLADDER TUMOR N/A 09/18/2015   Procedure: TRANSURETHRAL RESECTION OF BLADDER TUMOR (TURBT);  Surgeon: Ardis Hughs, MD;  Location: [facility];  Service: Urology;  Laterality: N/A;  . TRANSURETHRAL RESECTION OF BLADDER TUMOR N/A 10/17/2015   Procedure: TRANSURETHRAL RESECTION OF BLADDER TUMOR (TURBT);  Surgeon: Ardis Hughs, MD;  Location: [facility];  Service: Urology;  Laterality: N/A;    Current Outpatient Medications  Medication Sig Dispense Refill  . carbidopa-levodopa (SINEMET IR) 25-100 MG tablet Start with 1/2 tablet three times a day. In one week increase to a whole pill three times a day. Take 4 hours apart for example 8am, noon and 4pm. Try to separate Sinemet from protein by about 30-60 mins. 90 tablet 2  .  celecoxib (CELEBREX) 200 MG capsule Take 200 mg by mouth as needed.   2  . cholestyramine (QUESTRAN) 4 g packet MIX 1 PACKET (4 GM TOTAL) AND TAKE BY MOUTH TWICE DAILY. (Patient taking differently: MIX 1 PACKET (4 GM TOTAL) AND TAKE BY MOUTH DAILY.) 60 each 11  . diclofenac sodium (VOLTAREN) 1 % GEL Apply 1 application topically 2 (two) times daily.   5  . diphenhydrAMINE (BENADRYL) 25 MG tablet Take 25 mg by mouth 3 (three) times daily as needed for itching or allergies.     Marland Kitchen leflunomide (ARAVA) 20 MG tablet Take 20 mg by mouth daily.    Marland Kitchen losartan (COZAAR) 100 MG tablet 100 mg daily.  1  . metoCLOPramide (REGLAN) 5 MG tablet Take 2-3 times daily as needed for nausea. (Patient taking differently: Take 5 mg by mouth 3 (three) times daily as needed for nausea or vomiting. Take 2-3 times daily as needed for nausea.) 50 tablet 3  . mirabegron ER (MYRBETRIQ) 50 MG TB24 tablet Take 50 mg by mouth daily.    [facility] Bicarbonate (ZEGERID OTC PO) Take 1 capsule by mouth daily as needed (heartburn/acid reflux). Reported on 04/08/2015    . oxyCODONE-acetaminophen (PERCOCET/ROXICET) 5-325 MG tablet   0  . rotigotine (NEUPRO) 4 MG/24HR Place 1 patch onto the skin daily. 90 patch 11  . tiZANidine (ZANAFLEX) 2 MG tablet Take 2 mg by mouth at bedtime.   2   No current facility-administered medications for this visit.     Allergies as of 01/18/2017  . (No Known Allergies)    Vitals: There were no vitals taken for this visit. Last Weight:  Wt Readings from Last 1 Encounters:  11/09/16 161 lb 3.2 oz (73.1 kg)   Last Height:   Ht Readings from Last 1 Encounters:  11/09/16 5\' 4"  (1.626 m)   hypomimia, no acute distress, conversant, speech is normal fluent and spontaneous, patient is oriented to person place time, pupils are equally round and reactive, visual fields full, extraocular movements are intact, trigeminal sensation is intact, face is symmetric, mild bradykinesia on finger taps and  decreased amplitude, shuffling gait that is mildly improved, reduced arm swing, re-emergent tremor right hand which is improved, cogwheeling in the right enhanced with facilitation.   Assessment/Plan:  71 year old with parkinsonism. Exam shows shuffling gait, reduced arm swing, re-emergent tremor of the right hand, cogwheeling increased tone of the right upper extremity, brisk reflexes, hypomimia. Patient reports hypophonia and loss of smell. Concerning for Parkinson's disease however patient had a significant fall several years ago with fracture of the spine and reports her shuffling gait started immediately afterwards  which is not consistent with Parkinson's disease so need further workup including MRI brain and possibly DAT scan.   Exam improved since starting Neupro MRI of the brain showed possible normal pressure hydrocephalus however high-volume tap was unremarkable. DAT scan was positive for parkinsonian syndrome Will increase Neupro patch, our office had her approved through their assistance program Recommended home PT but they decline Increase Neupro patch to 6mg  daily   Sarina Ill, MD  [facility] [address] [place], [place] [address]  Phone [phone] Fax [phone]  A total of 15 minutes was spent in with this patient. Over half this time was spent on counseling patient on the PD diagnosis and different therapeutic options available.

## 2017-01-18 NOTE — Patient Instructions (Signed)
Increase Neupro patch to 6mg  daily Hold on to the 4mg  patches and we may use them later to increase to 8mg  (two, 2mg  patches) a day   Parkinson Disease Parkinson disease is a long-term (chronic) condition that gets worse over time (is progressive). Parkinson disease limits your ability to control your movements and move your body normally. This condition is a type of movement disorder. Each person with Parkinson disease is affected differently. The condition can range from mild to severe. Parkinson disease tends to progress slowly over several years. What are the causes? Parkinson disease results from a loss of brain cells (neurons) in a specific part of the brain (substantia nigra). Some of the neurons in the substantia nigra make an important brain chemical (dopamine). Dopamine is needed to control movement. As the condition gets worse, neurons make less dopamine. This makes it hard to move or control your movements. The exact cause of why neurons are lost or produce less dopamine is not known. Genetic and environmental factors may contribute to the cause of Parkinson disease. What increases the risk? This condition is more likely to develop in:  Men.  People who are 47 years of age or older.  People who have a family history of Parkinson disease.  What are the signs or symptoms? Symptoms of this condition can vary from person to person. The main (primary) symptoms are related to movement (motor symptoms). These include:  Uncontrolled shaking movements (tremor). Tremors usually start in a hand or foot when you are resting (resting tremor). The tremor may stop when you move around.  Slowing of movement. You may lose facial expression and have trouble making the small movements that are needed to button clothing or brush your teeth. You may walk with short, shuffling steps.  Stiff movement (rigidity). This mostly affects your arms, legs, neck, and upper body. You may walk without swinging  your arms. Rigidity can be painful.  Loss of balance and stability when standing. You may sway, fall backward, and have trouble making turns.  Secondary motor symptoms of this condition include:  Shrinking handwriting.  Stooped posture.  Slowed speech.  Trouble swallowing.  Drooling.  Sexual dysfunction.  Muscle cramps.  Loss of smell.  Additional symptoms that are not related to movement include:  Constipation.  Mood swings.  Depression or anxiety.  Sleep disturbances.  Confusion.  Loss of mental abilities (dementia).  Low blood pressure.  Trouble concentrating.  How is this diagnosed? Parkinson disease can be hard to diagnose in its early stages. A diagnosis may be made based on symptoms, a medical history, and physical exam. During your exam, your health care provider will look for:  Lack of facial expression.  Resting tremor.  Stiffness in your neck, arms, and legs.  Abnormal walk.  Trouble with balance.  You may have brain imaging tests done to check for a loss of dopamine-producing areas of the brain. Your healthcare provider may also grade the severity of your condition as mild, moderate, or advanced. Parkinson disease progression is different for everyone. You may not progress to the advanced stage. Mild Parkinson disease involves:  Movement problems that do not affect daily activities.  Movement problems on one side of the body.  Movement problems that are controlled with medicines.  Good response to exercise.  Moderate Parkinson disease involves:  Movement problems on both sides of the body.  Slowing of movement.  Coordination and balance problems.  Less of a response to medicine.  More side effects from  medicines.  Advanced Parkinson disease involves:  Extreme difficulty walking.  Inability to live alone safely.  Signs of dementia.  Difficulty controlling symptoms with medicine.  How is this treated? There is no cure  for Parkinson disease. Treatment focuses on relieving your symptoms. Treatment may include:  Medicines.  Speech, occupational, and physical therapy.  Surgery.  Everyone responds to medicines differently. Your response may change over time. Work with your health care provider to find the best medicines for you. These may include:  Dopamine replacement drugs. These are the most effective medicines. A long-term side effect of these medicines is uncontrolled movements (dyskinesias).  Dopamine agonists. These drugs act like dopamine to stimulate dopamine receptors in the brain. Side effects include nausea and sleepiness, but they cause less dyskinesia.  Other medicines to reduce tremor, prevent dopamine breakdown, reduce dyskinesia, and reduce dementia that is related to Parkinson disease.  Another treatment is deep brain stimulation surgery to reduce tremors and dyskinesia. This procedure involves placing electrodes in the brain. The electrodes are attached to an electric pulse generator that acts like a pacemaker for your brain. This may be an option if you have had the condition for at least four years and are not responding well to medicines. Follow these instructions at home:  Take over-the-counter and prescription medicines only as told by your health care provider.  Install grab bars and railings in your home to prevent falls.  Follow instructions from your health care provider about eating or drinking restrictions.  Return to your normal activities as told by your health care provider. Ask your health care provider what activities are safe for you.  Get regular exercise as told by your health care provider or a physical therapist.  Keep all follow-up visits as told by your health care provider. This is important. These include any visits with a speech therapist or occupational therapist.  Consider joining a support group for people with Parkinson disease. Contact a health care  provider if:  Medicines do not help your symptoms.  You are unsteady or have fallen at home.  You need more support to function well at home.  You have trouble swallowing.  You have severe constipation.  You are struggling with side effects from your medicines.  You see or hear things that are not real (hallucinate).  You feel confused, anxious, or depressed. Get help right away if:  You are injured after a fall.  You cannot swallow without choking.  You have chest pain or trouble breathing.  You do not feel safe at home. This information is not intended to replace advice given to you by your health care provider. Make sure you discuss any questions you have with your health care provider. Document Released: 01/16/2000 Document Revised: 06/23/2015 Document Reviewed: 11/08/2014 Elsevier Interactive Patient Education  [employer].

## 2017-01-19 ENCOUNTER — Other Ambulatory Visit: Payer: Self-pay | Admitting: Neurology

## 2017-01-19 DIAGNOSIS — C678 Malignant neoplasm of overlapping sites of bladder: Secondary | ICD-10-CM | POA: Diagnosis not present

## 2017-01-19 DIAGNOSIS — Z5111 Encounter for antineoplastic chemotherapy: Secondary | ICD-10-CM | POA: Diagnosis not present

## 2017-01-19 MED ORDER — ROTIGOTINE 6 MG/24HR TD PT24: 1.0000 | MEDICATED_PATCH | Freq: Every day | TRANSDERMAL | 5 refills | Status: DC

## 2017-01-20 ENCOUNTER — Telehealth: Payer: Self-pay | Admitting: Neurology

## 2017-01-20 NOTE — Telephone Encounter (Signed)
Called [facility] Patient  Assistance program called [facility] Neupro 6 patch apply patch to skin daily . Delivery accepted 01/24/2017.

## 2017-01-27 DIAGNOSIS — C678 Malignant neoplasm of overlapping sites of bladder: Secondary | ICD-10-CM | POA: Diagnosis not present

## 2017-01-27 DIAGNOSIS — Z5111 Encounter for antineoplastic chemotherapy: Secondary | ICD-10-CM | POA: Diagnosis not present

## 2017-02-02 DIAGNOSIS — C678 Malignant neoplasm of overlapping sites of bladder: Secondary | ICD-10-CM | POA: Diagnosis not present

## 2017-02-02 DIAGNOSIS — R8271 Bacteriuria: Secondary | ICD-10-CM | POA: Diagnosis not present

## 2017-02-28 DIAGNOSIS — M62830 Muscle spasm of back: Secondary | ICD-10-CM | POA: Diagnosis not present

## 2017-02-28 DIAGNOSIS — M8000XS Age-related osteoporosis with current pathological fracture, unspecified site, sequela: Secondary | ICD-10-CM | POA: Diagnosis not present

## 2017-02-28 DIAGNOSIS — M47817 Spondylosis without myelopathy or radiculopathy, lumbosacral region: Secondary | ICD-10-CM | POA: Diagnosis not present

## 2017-02-28 DIAGNOSIS — G894 Chronic pain syndrome: Secondary | ICD-10-CM | POA: Diagnosis not present

## 2017-03-12 ENCOUNTER — Encounter (HOSPITAL_COMMUNITY): Payer: Self-pay | Admitting: Nurse Practitioner

## 2017-03-12 DIAGNOSIS — Z9989 Dependence on other enabling machines and devices: Secondary | ICD-10-CM

## 2017-03-12 DIAGNOSIS — K219 Gastro-esophageal reflux disease without esophagitis: Secondary | ICD-10-CM | POA: Diagnosis present

## 2017-03-12 DIAGNOSIS — R001 Bradycardia, unspecified: Secondary | ICD-10-CM | POA: Diagnosis not present

## 2017-03-12 DIAGNOSIS — B961 Klebsiella pneumoniae [K. pneumoniae] as the cause of diseases classified elsewhere: Secondary | ICD-10-CM | POA: Diagnosis present

## 2017-03-12 DIAGNOSIS — E876 Hypokalemia: Secondary | ICD-10-CM | POA: Diagnosis not present

## 2017-03-12 DIAGNOSIS — R634 Abnormal weight loss: Secondary | ICD-10-CM | POA: Diagnosis present

## 2017-03-12 DIAGNOSIS — Z79899 Other long term (current) drug therapy: Secondary | ICD-10-CM

## 2017-03-12 DIAGNOSIS — G4730 Sleep apnea, unspecified: Secondary | ICD-10-CM | POA: Diagnosis present

## 2017-03-12 DIAGNOSIS — G20 Parkinson's disease: Secondary | ICD-10-CM | POA: Diagnosis present

## 2017-03-12 DIAGNOSIS — R531 Weakness: Secondary | ICD-10-CM | POA: Diagnosis not present

## 2017-03-12 DIAGNOSIS — E440 Moderate protein-calorie malnutrition: Secondary | ICD-10-CM | POA: Diagnosis present

## 2017-03-12 DIAGNOSIS — N3000 Acute cystitis without hematuria: Principal | ICD-10-CM | POA: Diagnosis present

## 2017-03-12 DIAGNOSIS — Z8551 Personal history of malignant neoplasm of bladder: Secondary | ICD-10-CM

## 2017-03-12 DIAGNOSIS — Z791 Long term (current) use of non-steroidal anti-inflammatories (NSAID): Secondary | ICD-10-CM

## 2017-03-12 DIAGNOSIS — E860 Dehydration: Secondary | ICD-10-CM | POA: Diagnosis not present

## 2017-03-12 DIAGNOSIS — Z6823 Body mass index (BMI) 23.0-23.9, adult: Secondary | ICD-10-CM

## 2017-03-12 DIAGNOSIS — I10 Essential (primary) hypertension: Secondary | ICD-10-CM | POA: Diagnosis present

## 2017-03-12 DIAGNOSIS — I499 Cardiac arrhythmia, unspecified: Secondary | ICD-10-CM | POA: Diagnosis not present

## 2017-03-12 LAB — CBC
HCT: 41.7 % (ref 36.0–46.0)
Hemoglobin: 13.9 g/dL (ref 12.0–15.0)
MCH: 30.5 pg (ref 26.0–34.0)
MCHC: 33.3 g/dL (ref 30.0–36.0)
MCV: 91.6 fL (ref 78.0–100.0)
PLATELETS: 333 10*3/uL (ref 150–400)
RBC: 4.55 MIL/uL (ref 3.87–5.11)
RDW: 14.1 % (ref 11.5–15.5)
WBC: 16.2 10*3/uL — AB (ref 4.0–10.5)

## 2017-03-12 LAB — TYPE AND SCREEN
ABO/RH(D): A NEG
ANTIBODY SCREEN: NEGATIVE

## 2017-03-12 LAB — COMPREHENSIVE METABOLIC PANEL
ALK PHOS: 99 U/L (ref 38–126)
ALT: 8 U/L — AB (ref 14–54)
AST: 24 U/L (ref 15–41)
Albumin: 3 g/dL — ABNORMAL LOW (ref 3.5–5.0)
Anion gap: 11 (ref 5–15)
BILIRUBIN TOTAL: 0.3 mg/dL (ref 0.3–1.2)
BUN: 15 mg/dL (ref 6–20)
CALCIUM: 7.8 mg/dL — AB (ref 8.9–10.3)
CHLORIDE: 98 mmol/L — AB (ref 101–111)
CO2: 26 mmol/L (ref 22–32)
CREATININE: 1.29 mg/dL — AB (ref 0.44–1.00)
GFR, EST AFRICAN AMERICAN: 46 mL/min — AB (ref >60)
GFR, EST NON AFRICAN AMERICAN: 40 mL/min — AB (ref >60)
Glucose, Bld: 122 mg/dL — ABNORMAL HIGH (ref 65–99)
Potassium: 3.1 mmol/L — ABNORMAL LOW (ref 3.5–5.1)
Sodium: 135 mmol/L (ref 135–145)
TOTAL PROTEIN: 8.1 g/dL (ref 6.5–8.1)

## 2017-03-12 NOTE — ED Triage Notes (Signed)
Pt states she has not felt well for the last 10 days, adds that she has noticed that her stools have been dark and she is feeling fatigued. She denies abdominal pain, N/V/D.

## 2017-03-13 ENCOUNTER — Observation Stay (HOSPITAL_COMMUNITY): Payer: Medicare Other

## 2017-03-13 ENCOUNTER — Inpatient Hospital Stay (HOSPITAL_COMMUNITY)
Admission: EM | Admit: 2017-03-13 | Discharge: 2017-03-15 | DRG: 690 | Disposition: A | Discharge status: Home Health | Payer: Medicare Other | Attending: Internal Medicine | Admitting: Internal Medicine

## 2017-03-13 ENCOUNTER — Emergency Department (HOSPITAL_COMMUNITY): Payer: Medicare Other

## 2017-03-13 ENCOUNTER — Other Ambulatory Visit: Payer: Self-pay

## 2017-03-13 DIAGNOSIS — Z8551 Personal history of malignant neoplasm of bladder: Secondary | ICD-10-CM | POA: Diagnosis not present

## 2017-03-13 DIAGNOSIS — N19 Unspecified kidney failure: Secondary | ICD-10-CM

## 2017-03-13 DIAGNOSIS — G20A1 Parkinson's disease without dyskinesia, without mention of fluctuations: Secondary | ICD-10-CM | POA: Diagnosis present

## 2017-03-13 DIAGNOSIS — N3000 Acute cystitis without hematuria: Secondary | ICD-10-CM

## 2017-03-13 DIAGNOSIS — N390 Urinary tract infection, site not specified: Secondary | ICD-10-CM | POA: Diagnosis present

## 2017-03-13 DIAGNOSIS — G20 Parkinson's disease: Secondary | ICD-10-CM | POA: Diagnosis not present

## 2017-03-13 DIAGNOSIS — E860 Dehydration: Secondary | ICD-10-CM

## 2017-03-13 DIAGNOSIS — N179 Acute kidney failure, unspecified: Secondary | ICD-10-CM | POA: Diagnosis not present

## 2017-03-13 DIAGNOSIS — R634 Abnormal weight loss: Secondary | ICD-10-CM | POA: Diagnosis not present

## 2017-03-13 DIAGNOSIS — E876 Hypokalemia: Secondary | ICD-10-CM

## 2017-03-13 DIAGNOSIS — R531 Weakness: Secondary | ICD-10-CM | POA: Diagnosis not present

## 2017-03-13 LAB — URINALYSIS, ROUTINE W REFLEX MICROSCOPIC
Bilirubin Urine: NEGATIVE
GLUCOSE, UA: NEGATIVE mg/dL
Ketones, ur: NEGATIVE mg/dL
NITRITE: POSITIVE — AB
PH: 6 (ref 5.0–8.0)
Protein, ur: 100 mg/dL — AB
Specific Gravity, Urine: 1.012 (ref 1.005–1.030)

## 2017-03-13 LAB — CBC WITH DIFFERENTIAL/PLATELET
BASOS ABS: 0 10*3/uL (ref 0.0–0.1)
BASOS PCT: 0 %
Eosinophils Absolute: 0.2 10*3/uL (ref 0.0–0.7)
Eosinophils Relative: 1 %
HCT: 38.2 % (ref 36.0–46.0)
HEMOGLOBIN: 12.6 g/dL (ref 12.0–15.0)
LYMPHS PCT: 7 %
Lymphs Abs: 1.1 10*3/uL (ref 0.7–4.0)
MCH: 30.5 pg (ref 26.0–34.0)
MCHC: 33 g/dL (ref 30.0–36.0)
MCV: 92.5 fL (ref 78.0–100.0)
Monocytes Absolute: 1.2 10*3/uL — ABNORMAL HIGH (ref 0.1–1.0)
Monocytes Relative: 8 %
NEUTROS ABS: 12 10*3/uL — AB (ref 1.7–7.7)
NEUTROS PCT: 84 %
Platelets: 283 10*3/uL (ref 150–400)
RBC: 4.13 MIL/uL (ref 3.87–5.11)
RDW: 14.3 % (ref 11.5–15.5)
WBC: 14.5 10*3/uL — ABNORMAL HIGH (ref 4.0–10.5)

## 2017-03-13 LAB — BASIC METABOLIC PANEL
ANION GAP: 10 (ref 5–15)
BUN: 13 mg/dL (ref 6–20)
CO2: 24 mmol/L (ref 22–32)
Calcium: 7.1 mg/dL — ABNORMAL LOW (ref 8.9–10.3)
Chloride: 99 mmol/L — ABNORMAL LOW (ref 101–111)
Creatinine, Ser: 1.31 mg/dL — ABNORMAL HIGH (ref 0.44–1.00)
GFR, EST AFRICAN AMERICAN: 46 mL/min — AB (ref >60)
GFR, EST NON AFRICAN AMERICAN: 39 mL/min — AB (ref >60)
GLUCOSE: 141 mg/dL — AB (ref 65–99)
POTASSIUM: 3.4 mmol/L — AB (ref 3.5–5.1)
Sodium: 133 mmol/L — ABNORMAL LOW (ref 135–145)

## 2017-03-13 LAB — ABO/RH: ABO/RH(D): A NEG

## 2017-03-13 LAB — MAGNESIUM: Magnesium: 1.9 mg/dL (ref 1.7–2.4)

## 2017-03-13 LAB — POC OCCULT BLOOD, ED: Fecal Occult Bld: NEGATIVE

## 2017-03-13 MED ORDER — DEXTROSE 5 % IV SOLN
1.0000 g | INTRAVENOUS | Status: DC
  Administered 2017-03-14: 04:00:00 1 g via INTRAVENOUS
  Filled 2017-03-13: qty 10

## 2017-03-13 MED ORDER — DEXTROSE 5 % IV SOLN
1.0000 g | Freq: Once | INTRAVENOUS | Status: AC
Start: 2017-03-13 — End: 2017-03-13
  Administered 2017-03-13: 1 g via INTRAVENOUS
  Filled 2017-03-13: qty 10

## 2017-03-13 MED ORDER — ROTIGOTINE 6 MG/24HR TD PT24
1.0000 | MEDICATED_PATCH | Freq: Every day | TRANSDERMAL | Status: DC
  Filled 2017-03-13 (×2): qty 1

## 2017-03-13 MED ORDER — POTASSIUM CHLORIDE CRYS ER 20 MEQ PO TBCR
40.0000 meq | EXTENDED_RELEASE_TABLET | Freq: Once | ORAL | Status: AC
  Administered 2017-03-13: 40 meq via ORAL
  Filled 2017-03-13: qty 2

## 2017-03-13 MED ORDER — SODIUM CHLORIDE 0.9 % IV SOLN
INTRAVENOUS | Status: DC
  Administered 2017-03-13 – 2017-03-14 (×2): via INTRAVENOUS

## 2017-03-13 MED ORDER — TIZANIDINE HCL 4 MG PO TABS: 2.0000 mg | ORAL_TABLET | Freq: Every evening | ORAL | Status: DC | PRN

## 2017-03-13 MED ORDER — MIRABEGRON ER 25 MG PO TB24
50.0000 mg | ORAL_TABLET | Freq: Every day | ORAL | Status: DC
  Administered 2017-03-13 – 2017-03-15 (×3): 50 mg via ORAL
  Filled 2017-03-13 (×3): qty 2

## 2017-03-13 MED ORDER — ACETAMINOPHEN 650 MG RE SUPP
650.0000 mg | Freq: Four times a day (QID) | RECTAL | Status: DC | PRN
Start: 2017-03-13 — End: 2017-03-15

## 2017-03-13 MED ORDER — ONDANSETRON HCL 4 MG/2ML IJ SOLN: 4.0000 mg | Freq: Four times a day (QID) | INTRAMUSCULAR | Status: DC | PRN

## 2017-03-13 MED ORDER — ENOXAPARIN SODIUM 40 MG/0.4ML ~~LOC~~ SOLN
40.0000 mg | SUBCUTANEOUS | Status: DC
  Administered 2017-03-13 – 2017-03-15 (×3): 40 mg via SUBCUTANEOUS
  Filled 2017-03-13 (×3): qty 0.4

## 2017-03-13 MED ORDER — ACETAMINOPHEN 325 MG PO TABS
650.0000 mg | ORAL_TABLET | Freq: Four times a day (QID) | ORAL | Status: DC | PRN
  Administered 2017-03-14: 16:00:00 650 mg via ORAL
  Filled 2017-03-13: qty 2

## 2017-03-13 MED ORDER — SODIUM CHLORIDE 0.9 % IV BOLUS (SEPSIS)
500.0000 mL | Freq: Once | INTRAVENOUS | Status: AC
  Administered 2017-03-13: 500 mL via INTRAVENOUS

## 2017-03-13 MED ORDER — OXYCODONE-ACETAMINOPHEN 7.5-325 MG PO TABS
1.0000 | ORAL_TABLET | Freq: Four times a day (QID) | ORAL | Status: DC | PRN
Start: 2017-03-13 — End: 2017-03-15
  Administered 2017-03-14 – 2017-03-15 (×4): 1 via ORAL
  Filled 2017-03-13 (×4): qty 1

## 2017-03-13 MED ORDER — ONDANSETRON HCL 4 MG PO TABS: 4.0000 mg | ORAL_TABLET | Freq: Four times a day (QID) | ORAL | Status: DC | PRN

## 2017-03-13 NOTE — Progress Notes (Signed)
Patient arrived on the unit at approximately 0510. She is alert and verbally responsive and voiced no complaints at this time. Will monitor for any signs of discomfort.

## 2017-03-13 NOTE — ED Notes (Addendum)
Pt aware that urine sample is needed, but does not think she can give a sample.

## 2017-03-13 NOTE — H&P (Signed)
History and Physical    Tara Ingram DVV:[id number] DOB: 07/04/1945 DOA: 03/13/2017  PCP: Esaw Grandchild, NP  Patient coming from: Home  I have personally briefly reviewed patient's old medical records in [facility]  Chief Complaint: unintentional wt loss, nausea  HPI: Tara Ingram is a 72 y.o. female with medical history significant of bladder cancer undergoing BCG treatment with Dr. Louis Meckel, GERD, parkinson's disease.  Patient presents to the ED with generalized weakness and nausea for past 3 weeks.  Husband estimates 30 lbs unintentional wt loss in that time.  Poor PO intake.   ED Course: Hemoccult negative despite dark stools.  UA demonstrates UTI.  WBC 16k.  Started on rocephin.   Review of Systems: As per HPI otherwise 10 point review of systems negative.   Past Medical History:  Diagnosis Date  . Allergy   . Arthritis    hands/feet/back ra  . Bladder cancer ([place])   . Cataract    bilateral  . GERD (gastroesophageal reflux disease)   . Headache   . History of kidney stones 50 yrs ago  . Hyperlipidemia   . Hypertension   . Sleep apnea    uses CPAP every night setting of 12    Past Surgical History:  Procedure Laterality Date  . APPENDECTOMY    . BACK SURGERY  05/2014   lower back for crushed disc  . CHOLECYSTECTOMY    . COLONOSCOPY  12/2011   hx polyps/jacobs  . CORNEAL TRANSPLANT     bilateral  . CYSTOSCOPY W/ RETROGRADES Bilateral 09/18/2015   Procedure: BILATERAL RETROGRADE PYELOGRAM;  Surgeon: Ardis Hughs, MD;  Location: [facility];  Service: Urology;  Laterality: Bilateral;  . CYSTOSCOPY W/ RETROGRADES Right 10/17/2015   Procedure: CYSTOSCOPY WITH RETROGRADE PYELOGRAM;  Surgeon: Ardis Hughs, MD;  Location: [facility];  Service: Urology;  Laterality: Right;  . EYE SURGERY Bilateral    lens replacement for cataract  . FOOT SURGERY Left 6 yrs ago   tammer toe reconstruction  . PARTIAL HYSTERECTOMY    . TONSILLECTOMY    . TRANSURETHRAL  RESECTION OF BLADDER TUMOR N/A 09/18/2015   Procedure: TRANSURETHRAL RESECTION OF BLADDER TUMOR (TURBT);  Surgeon: Ardis Hughs, MD;  Location: [facility];  Service: Urology;  Laterality: N/A;  . TRANSURETHRAL RESECTION OF BLADDER TUMOR N/A 10/17/2015   Procedure: TRANSURETHRAL RESECTION OF BLADDER TUMOR (TURBT);  Surgeon: Ardis Hughs, MD;  Location: [facility];  Service: Urology;  Laterality: N/A;     reports that  has never smoked. she has never used smokeless tobacco. She reports that she does not drink alcohol or use drugs.  No Known Allergies  Family History  Problem Relation Age of Onset  . Colon cancer Sister 65  . Allergies Mother   . Heart disease Mother   . Rheumatologic disease Mother   . Allergies Father   . Asthma Father   . Heart disease Father   . Esophageal cancer Neg Hx   . Colon polyps Neg Hx   . Rectal cancer Neg Hx   . Stomach cancer Neg Hx      Prior to Admission medications   Medication Sig Start Date End Date Taking? Authorizing Provider  denosumab (PROLIA) 60 MG/ML SOLN injection Inject 60 mg into the skin every 6 (six) months. Administer in upper arm, thigh, or abdomen   Yes [provider]  diclofenac sodium (VOLTAREN) 1 % GEL Apply 1 application topically 2 (two) times daily as needed (  pain).  01/15/15  Yes [provider]  losartan (COZAAR) 100 MG tablet Take 100 mg by mouth daily.  09/15/16  Yes [provider]  mirabegron ER (MYRBETRIQ) 50 MG TB24 tablet Take 50 mg by mouth daily.   Yes [provider]  oxyCODONE-acetaminophen (PERCOCET) 7.5-325 MG tablet Take 1 tablet by mouth every 6 (six) hours as needed for moderate pain or severe pain.  02/23/17  Yes [provider]  tiZANidine (ZANAFLEX) 2 MG tablet Take 2 mg by mouth at bedtime as needed for muscle spasms.  12/31/14  Yes [provider]  rotigotine (NEUPRO) 6 MG/24HR Place 1 patch onto the skin daily. Patient not taking: Reported on  03/13/2017 01/19/17   Melvenia Beam, MD    Physical Exam: Vitals:   03/13/17 0230 03/13/17 0257 03/13/17 0330 03/13/17 0400  BP: 120/66 120/66 (!) 111/56 (!) 104/58  Pulse: 85 87 85 86  Resp:  18    Temp:      TempSrc:      SpO2: 96% 96% 95% 93%    Constitutional: NAD, calm, comfortable Eyes: PERRL, lids and conjunctivae normal ENMT: Mucous membranes are moist. Posterior pharynx clear of any exudate or lesions.Normal dentition.  Neck: normal, supple, no masses, no thyromegaly Respiratory: clear to auscultation bilaterally, no wheezing, no crackles. Normal respiratory effort. No accessory muscle use.  Cardiovascular: Regular rate and rhythm, no murmurs / rubs / gallops. No extremity edema. 2+ pedal pulses. No carotid bruits.  Abdomen: no tenderness, no masses palpated. No hepatosplenomegaly. Bowel sounds positive.  Musculoskeletal: no clubbing / cyanosis. No joint deformity upper and lower extremities. Good ROM, no contractures. Normal muscle tone.  Skin: no rashes, lesions, ulcers. No induration Neurologic: CN 2-12 grossly intact. Sensation intact, DTR normal. Strength 5/5 in all 4.  Psychiatric: Normal judgment and insight. Alert and oriented x 3. Normal mood.    Labs on Admission: I have personally reviewed following labs and imaging studies  CBC: Recent Labs  Lab 03/12/17 1953  WBC 16.2*  HGB 13.9  HCT 41.7  MCV 91.6  PLT 283   Basic Metabolic Panel: Recent Labs  Lab 03/12/17 1953  NA 135  K 3.1*  CL 98*  CO2 26  GLUCOSE 122*  BUN 15  CREATININE 1.29*  CALCIUM 7.8*   GFR: CrCl cannot be calculated (Unknown ideal weight.). Liver Function Tests: Recent Labs  Lab 03/12/17 1953  AST 24  ALT 8*  ALKPHOS 99  BILITOT 0.3  PROT 8.1  ALBUMIN 3.0*   No results for input(s): LIPASE, AMYLASE in the last 168 hours. No results for input(s): AMMONIA in the last 168 hours. Coagulation Profile: No results for input(s): INR, PROTIME in the last 168  hours. Cardiac Enzymes: No results for input(s): CKTOTAL, CKMB, CKMBINDEX, TROPONINI in the last 168 hours. BNP (last 3 results) No results for input(s): PROBNP in the last 8760 hours. HbA1C: No results for input(s): HGBA1C in the last 72 hours. CBG: No results for input(s): GLUCAP in the last 168 hours. Lipid Profile: No results for input(s): CHOL, HDL, LDLCALC, TRIG, CHOLHDL, LDLDIRECT in the last 72 hours. Thyroid Function Tests: No results for input(s): TSH, T4TOTAL, FREET4, T3FREE, THYROIDAB in the last 72 hours. Anemia Panel: No results for input(s): VITAMINB12, FOLATE, FERRITIN, TIBC, IRON, RETICCTPCT in the last 72 hours. Urine analysis:    Component Value Date/Time   COLORURINE YELLOW 03/13/2017 0319   APPEARANCEUR CLOUDY (A) 03/13/2017 0319   LABSPEC 1.012 03/13/2017 0319   PHURINE  6.0 03/13/2017 0319   GLUCOSEU NEGATIVE 03/13/2017 0319   HGBUR SMALL (A) 03/13/2017 0319   BILIRUBINUR NEGATIVE 03/13/2017 0319   KETONESUR NEGATIVE 03/13/2017 0319   PROTEINUR 100 (A) 03/13/2017 0319   NITRITE POSITIVE (A) 03/13/2017 0319   LEUKOCYTESUR LARGE (A) 03/13/2017 0319    Radiological Exams on Admission: Dg Chest 2 View  Result Date: 03/13/2017 CLINICAL DATA:  72 y/o  F; generalized weakness. EXAM: CHEST  2 VIEW COMPARISON:  09/02/2015 chest radiograph. FINDINGS: Stable heart size and mediastinal contours are within normal limits. Both lungs are clear. Mild reverse S curvature of the thoracolumbar spine. Stable L2 compression deformity. IMPRESSION: No acute pulmonary process identified. Electronically Signed   By: Kristine Garbe M.D.   On: 03/13/2017 01:44    EKG: Independently reviewed.  Assessment/Plan Principal Problem:   UTI (urinary tract infection) Active Problems:   Parkinson's disease ([place])   History of bladder cancer   Unintentional weight loss    1. UTI - 1. Rocephin 2. Culture pending 3. IVF: NS at 75 cc/hr 2. Unintentional weight loss - 1. DDx  includes UTI vs recurrence of bladder cancer vs other 2. Treating UTI as above 3. Call oncology in AM and find out what the best study (if any) would be at this point to r/o recurrent / occult CA.  PET scan perhaps? 3. Parkinson's disease - 1. Resume Neupro 2. Note she has had lapse in treatment recently since Dec, but wouldn't expect nausea and 30lbs unintentional wt loss to be the presenting complaint for Parkinson's disease usually.  DVT prophylaxis: Lovenox Code Status: Full Family Communication: No family in room Disposition Plan: Home after admit Consults called: None Admission status: Place in obs   [place], [place] [facility] Pager [phone]  If 7AM-7PM, please contact day team taking care of patient [email or web address] Password [facility]  03/13/2017, 5:36 AM

## 2017-03-13 NOTE — ED Notes (Signed)
Pt could not complete orthostatic bp for 3 minute standing.

## 2017-03-13 NOTE — ED Provider Notes (Signed)
[facility] DEPT Provider Note   CSN: [id number] Arrival date & time: 03/12/17  1921     History   Chief Complaint Chief Complaint  Patient presents with  . Not Feeling Well    HPI MAKYAH LAVIGNE is a 72 y.o. female.  The history is provided by the patient and medical records.     72 year old female with history of seasonal allergies, arthritis, bladder cancer undergoing BCG treatment with Dr. Louis Meckel, GERD, hyperlipidemia, hypertension, sleep apnea, Parkinson's disease, presenting to the ED with generalized weakness and not feeling well.  Reports this is been confounding over the past several weeks.  Husband at bedside reports she is lost an estimated 30 pounds since this began.  States usually she will eat a few spoonfuls of food daily but that is about it.  She also has had very poor fluid intake.  They report over the past few days her stools have been dark in color, almost black.  No grossly bloody bowel movements.  Has had some nausea and vomiting but mostly liquid.  No coffee-ground emesis or hematemesis.  She has not currently on anticoagulation.  States he has had a few falls but this is not abnormal for her as her gait is poor due to her Parkinson's disease.  She has not had any recent changes in her medications.  Denies any fever or chills.  Past Medical History:  Diagnosis Date  . Allergy   . Arthritis    hands/feet/back ra  . Bladder cancer ([place])   . Cataract    bilateral  . GERD (gastroesophageal reflux disease)   . Headache   . History of kidney stones 50 yrs ago  . Hyperlipidemia   . Hypertension   . Sleep apnea    uses CPAP every night setting of 12    Patient Active Problem List   Diagnosis Date Noted  . Parkinson's disease ([place]) 11/16/2016  . Gait abnormality 10/06/2016  . Resting tremor 10/06/2016  . Malignant tumor of trigone of bladder ([place]) 09/18/2015  . OSA (obstructive sleep apnea) 12/30/2014  . Closed wedge  compression fracture of second lumbar vertebra ([place])   . Mechanical complication due to intraocular lens implant 11/18/2010  . Nuclear cataract 11/18/2010  . DIARRHEA 04/17/2010    Past Surgical History:  Procedure Laterality Date  . APPENDECTOMY    . BACK SURGERY  05/2014   lower back for crushed disc  . CHOLECYSTECTOMY    . COLONOSCOPY  12/2011   hx polyps/jacobs  . CORNEAL TRANSPLANT     bilateral  . CYSTOSCOPY W/ RETROGRADES Bilateral 09/18/2015   Procedure: BILATERAL RETROGRADE PYELOGRAM;  Surgeon: Ardis Hughs, MD;  Location: [facility];  Service: Urology;  Laterality: Bilateral;  . CYSTOSCOPY W/ RETROGRADES Right 10/17/2015   Procedure: CYSTOSCOPY WITH RETROGRADE PYELOGRAM;  Surgeon: Ardis Hughs, MD;  Location: [facility];  Service: Urology;  Laterality: Right;  . EYE SURGERY Bilateral    lens replacement for cataract  . FOOT SURGERY Left 6 yrs ago   tammer toe reconstruction  . PARTIAL HYSTERECTOMY    . TONSILLECTOMY    . TRANSURETHRAL RESECTION OF BLADDER TUMOR N/A 09/18/2015   Procedure: TRANSURETHRAL RESECTION OF BLADDER TUMOR (TURBT);  Surgeon: Ardis Hughs, MD;  Location[facility];  Service: Urology;  Laterality: N/A;  . TRANSURETHRAL RESECTION OF BLADDER TUMOR N/A 10/17/2015   Procedure: TRANSURETHRAL RESECTION OF BLADDER TUMOR (TURBT);  Surgeon: Ardis Hughs, MD;  Location: [facility];  Service:  Urology;  Laterality: N/A;    OB History    No data available       Home Medications    Prior to Admission medications   Medication Sig Start Date End Date Taking? Authorizing Provider  carbidopa-levodopa (SINEMET IR) 25-100 MG tablet Start with 1/2 tablet three times a day. In one week increase to a whole pill three times a day. Take 4 hours apart for example 8am, noon and 4pm. Try to separate Sinemet from protein by about 30-60 mins. 10/20/16   Melvenia Beam, MD  celecoxib (CELEBREX) 200 MG capsule Take 200 mg by mouth as needed.  07/21/16   [provider]  cholestyramine (QUESTRAN) 4 g packet MIX 1 PACKET (4 GM TOTAL) AND TAKE BY MOUTH TWICE DAILY. Patient taking differently: MIX 1 PACKET (4 GM TOTAL) AND TAKE BY MOUTH DAILY. 09/05/15   Milus Banister, MD  diclofenac sodium (VOLTAREN) 1 % GEL Apply 1 application topically 2 (two) times daily.  01/15/15   [provider]  diphenhydrAMINE (BENADRYL) 25 MG tablet Take 25 mg by mouth 3 (three) times daily as needed for itching or allergies.     [provider]  leflunomide (ARAVA) 20 MG tablet Take 20 mg by mouth daily.    [provider]  losartan (COZAAR) 100 MG tablet 100 mg daily. 09/15/16   [provider]  mirabegron ER (MYRBETRIQ) 50 MG TB24 tablet Take 50 mg by mouth daily.    [provider]  Omeprazole-Sodium Bicarbonate (ZEGERID OTC PO) Take 1 capsule by mouth daily as needed (heartburn/acid reflux). Reported on 04/08/2015    [provider]  oxyCODONE-acetaminophen (PERCOCET/ROXICET) 5-325 MG tablet  07/31/16   [provider]  rotigotine (NEUPRO) 6 MG/24HR Place 1 patch onto the skin daily. 01/19/17   Melvenia Beam, MD  tiZANidine (ZANAFLEX) 2 MG tablet Take 2 mg by mouth at bedtime.  12/31/14   [provider]    Family History Family History  Problem Relation Age of Onset  . Colon cancer Sister 9  . Allergies Mother   . Heart disease Mother   . Rheumatologic disease Mother   . Allergies Father   . Asthma Father   . Heart disease Father   . Esophageal cancer Neg Hx   . Colon polyps Neg Hx   . Rectal cancer Neg Hx   . Stomach cancer Neg Hx     Social History Social History   Tobacco Use  . Smoking status: Never Smoker  . Smokeless tobacco: Never Used  Substance Use Topics  . Alcohol use: No    Alcohol/week: 0.0 oz  . Drug use: No     Allergies   Patient has no known allergies.   Review of Systems Review of Systems  Constitutional: Positive for fatigue.  Neurological:  Positive for weakness (generalized).  All other systems reviewed and are negative.    Physical Exam Updated Vital Signs BP 121/72 (BP Location: Right Arm)   Pulse 93   Temp 97.9 F (36.6 C) (Oral)   Resp 18   SpO2 95%   Physical Exam  Constitutional: She is oriented to person, place, and time. She appears well-developed and well-nourished.  HENT:  Head: Normocephalic and atraumatic.  Mouth/Throat: Oropharynx is clear and moist.  Somewhat dry mucous membranes  Eyes: Conjunctivae and EOM are normal. Pupils are equal, round, and reactive to light.  Neck: Normal range of motion.  Cardiovascular: Normal rate, regular rhythm and normal heart  sounds.  Pulmonary/Chest: Effort normal and breath sounds normal. No stridor. No respiratory distress.  Abdominal: Soft. Bowel sounds are normal. There is no tenderness. There is no rebound.  Genitourinary:  Genitourinary Comments: Exam chaperoned by family Light brown stool noted on DRE, no gross melena or hematochezia  Musculoskeletal: Normal range of motion.  Neurological: She is alert and oriented to person, place, and time.  Skin: Skin is warm and dry.  Psychiatric: She has a normal mood and affect.  Nursing note and vitals reviewed.    ED Treatments / Results  Labs (all labs ordered are listed, but only abnormal results are displayed) Labs Reviewed  COMPREHENSIVE METABOLIC PANEL - Abnormal; Notable for the following components:      Result Value   Potassium 3.1 (*)    Chloride 98 (*)    Glucose, Bld 122 (*)    Creatinine, Ser 1.29 (*)    Calcium 7.8 (*)    Albumin 3.0 (*)    ALT 8 (*)    GFR calc non Af Amer 40 (*)    GFR calc Af Amer 46 (*)    All other components within normal limits  CBC - Abnormal; Notable for the following components:   WBC 16.2 (*)    All other components within normal limits  URINALYSIS, ROUTINE W REFLEX MICROSCOPIC - Abnormal; Notable for the following components:   APPearance CLOUDY (*)    Hgb  urine dipstick SMALL (*)    Protein, ur 100 (*)    Nitrite POSITIVE (*)    Leukocytes, UA LARGE (*)    Bacteria, UA MANY (*)    Squamous Epithelial / LPF 0-5 (*)    Non Squamous Epithelial 0-5 (*)    All other components within normal limits  URINE CULTURE  POC OCCULT BLOOD, ED  TYPE AND SCREEN  ABO/RH    EKG  EKG Interpretation None       Radiology Dg Chest 2 View  Result Date: 03/13/2017 CLINICAL DATA:  72 y/o  F; generalized weakness. EXAM: CHEST  2 VIEW COMPARISON:  09/02/2015 chest radiograph. FINDINGS: Stable heart size and mediastinal contours are within normal limits. Both lungs are clear. Mild reverse S curvature of the thoracolumbar spine. Stable L2 compression deformity. IMPRESSION: No acute pulmonary process identified. Electronically Signed   By: Kristine Garbe M.D.   On: 03/13/2017 01:44    Procedures Procedures (including critical care time)  Medications Ordered in ED Medications  cefTRIAXone (ROCEPHIN) 1 g in dextrose 5 % 50 mL IVPB (not administered)  sodium chloride 0.9 % bolus 500 mL (0 mLs Intravenous Stopped 03/13/17 0359)  potassium chloride SA (K-DUR,KLOR-CON) CR tablet 40 mEq (40 mEq Oral Given 03/13/17 0137)     Initial Impression / Assessment and Plan / ED Course  I have reviewed the triage vital signs and the nursing notes.  Pertinent labs & imaging results that were available during my care of the patient were reviewed by me and considered in my medical decision making (see chart for details).  72 year old female here with several weeks of generalized weakness.  She has had poor appetite.  Husband reports dark stools but no frank blood.  She is afebrile and nontoxic in appearance here.  She does appear overall weak and fatigued.  Her vitals are stable.  Rectal exam performed, light brown stool noted.  Hemoccult negative.  Labs overall reassuring, slight bump in creatinine which I suspect is from dehydration.  Mild hypokalemia also noted,  likely from poor  oral intake.  Chest x-ray is clear.  UA infectious with associated leukocytosis, culture pending.  Will start on IV Rocephin and admit for ongoing care.  Family updated and agreeable to admission.  Discussed with hosptalist service-- they will admit for ongoing care.  Final Clinical Impressions(s) / ED Diagnoses   Final diagnoses:  Acute cystitis without hematuria  Weakness  Dehydration  Hypokalemia    ED Discharge Orders    None       Larene Pickett, PA-C 03/13/17 [place], Delice Bison, DO 03/13/17 [phone]

## 2017-03-13 NOTE — Progress Notes (Signed)
Patient ID: Tara Ingram, female   DOB: [birth year], 72 y.o.   MRN: [id number] Patient was admitted early this morning for unintentional weight loss and nausea.  She was started on intravenous Rocephin for probable UTI.  Patient seen and examined at bedside and plan of care discussed with her.  I reviewed patient's medical records including this morning's H&P myself.  I have ordered stat blood work for today.  Patient will need repeat blood work tomorrow morning.  If renal function improves, patient might need further imaging studies including CAT scan of the chest abdomen and pelvis with contrast to investigate patient's unintentional weight loss.  Follow cultures.

## 2017-03-13 NOTE — ED Notes (Signed)
ED TO INPATIENT HANDOFF REPORT  Name/Age/Gender Tara Ingram 71 y.o. female  Code Status Code Status History    Date Active Date Inactive Code Status Order ID Comments User Context   09/18/2015 13:27 09/20/2015 12:46 Full Code [id number]  Ardis Hughs, MD Inpatient   07/05/2014 10:15 07/06/2014 03:21 Full Code [id number]  Luanne Bras, MD HOV      Home/SNF/Other Home  Chief Complaint poss. GI bleed  Level of Care/Admitting Diagnosis ED Disposition    ED Disposition Condition Comment   Admit  Hospital Area: [facility] [[id number]]  Level of Care: Med-Surg [16]  Diagnosis: UTI (urinary tract infection) [485462]  Admitting Physician: Etta Quill [phone]  Attending Physician: Etta Quill [[id number]]  PT Class (Do Not Modify): Observation [104]  PT Acc Code (Do Not Modify): Observation [10022]       Medical History Past Medical History:  Diagnosis Date  . Allergy   . Arthritis    hands/feet/back ra  . Bladder cancer ([place])   . Cataract    bilateral  . GERD (gastroesophageal reflux disease)   . Headache   . History of kidney stones 50 yrs ago  . Hyperlipidemia   . Hypertension   . Sleep apnea    uses CPAP every night setting of 12    Allergies No Known Allergies  IV Location/Drains/Wounds Patient Lines/Drains/Airways Status   Active Line/Drains/Airways    Name:   Placement date:   Placement time:   Site:   Days:   Peripheral IV 07/05/14 Left Forearm   07/05/14    0716    Forearm   982   Peripheral IV 09/18/15 Right Wrist   09/18/15    0829    Wrist   542   Peripheral IV 03/19/16 Left Arm   03/19/16    1348    Arm   359   Peripheral IV 03/13/17 Left;Anterior Forearm   03/13/17    0143    Forearm   less than 1   Urethral Catheter Dr. Louis Meckel Latex 20 Fr.   10/17/15    1359    Latex   513          Labs/Imaging Results for orders placed or performed during the hospital encounter of 03/13/17 (from the past 48 hour(s))   Comprehensive metabolic panel     Status: Abnormal   Collection Time: 03/12/17  7:53 PM  Result Value Ref Range   Sodium 135 135 - 145 mmol/L   Potassium 3.1 (L) 3.5 - 5.1 mmol/L   Chloride 98 (L) 101 - 111 mmol/L   CO2 26 22 - 32 mmol/L   Glucose, Bld 122 (H) 65 - 99 mg/dL   BUN 15 6 - 20 mg/dL   Creatinine, Ser 1.29 (H) 0.44 - 1.00 mg/dL   Calcium 7.8 (L) 8.9 - 10.3 mg/dL   Total Protein 8.1 6.5 - 8.1 g/dL   Albumin 3.0 (L) 3.5 - 5.0 g/dL   AST 24 15 - 41 U/L   ALT 8 (L) 14 - 54 U/L   Alkaline Phosphatase 99 38 - 126 U/L   Total Bilirubin 0.3 0.3 - 1.2 mg/dL   GFR calc non Af Amer 40 (L) >60 mL/min   GFR calc Af Amer 46 (L) >60 mL/min    Comment: (NOTE) The eGFR has been calculated using the CKD EPI equation. This calculation has not been validated in all clinical situations. eGFR's persistently <60 mL/min signify possible Chronic Kidney Disease.  Anion gap 11 5 - 15    Comment: Performed at [facility], [place] [address] [place], [place] [address]  CBC     Status: Abnormal   Collection Time: 03/12/17  7:53 PM  Result Value Ref Range   WBC 16.2 (H) 4.0 - 10.5 K/uL   RBC 4.55 3.87 - 5.11 MIL/uL   Hemoglobin 13.9 12.0 - 15.0 g/dL   HCT 41.7 36.0 - 46.0 %   MCV 91.6 78.0 - 100.0 fL   MCH 30.5 26.0 - 34.0 pg   MCHC 33.3 30.0 - 36.0 g/dL   RDW 14.1 11.5 - 15.5 %   Platelets 333 150 - 400 K/uL    Comment: Performed at [facility], [place] [address], [place], [place] [address]  ABO/Rh     Status: None   Collection Time: 03/12/17  7:53 PM  Result Value Ref Range   ABO/RH(D)      A NEG Performed at [place] [address] [place], [place] [address]   Type and screen [place]     Status: None   Collection Time: 03/12/17  7:56 PM  Result Value Ref Range   ABO/RH(D) A NEG    Antibody Screen NEG    Sample Expiration      03/15/2017 Performed at [facility], [place]  [address], [place], [place] [address]   POC occult blood, ED     Status: None   Collection Time: 03/13/17  1:09 AM  Result Value Ref Range   Fecal Occult Bld NEGATIVE NEGATIVE  Urinalysis, Routine w reflex microscopic     Status: Abnormal   Collection Time: 03/13/17  3:19 AM  Result Value Ref Range   Color, Urine YELLOW YELLOW   APPearance CLOUDY (A) CLEAR   Specific Gravity, Urine 1.012 1.005 - 1.030   pH 6.0 5.0 - 8.0   Glucose, UA NEGATIVE NEGATIVE mg/dL   Hgb urine dipstick SMALL (A) NEGATIVE   Bilirubin Urine NEGATIVE NEGATIVE   Ketones, ur NEGATIVE NEGATIVE mg/dL   Protein, ur 100 (A) NEGATIVE mg/dL   Nitrite POSITIVE (A) NEGATIVE   Leukocytes, UA LARGE (A) NEGATIVE   RBC / HPF 6-30 0 - 5 RBC/hpf   WBC, UA TOO NUMEROUS TO COUNT 0 - 5 WBC/hpf   Bacteria, UA MANY (A) NONE SEEN   Squamous Epithelial / LPF 0-5 (A) NONE SEEN   WBC Clumps PRESENT    Non Squamous Epithelial 0-5 (A) NONE SEEN    Comment: Performed at [facility], [place] [address], [place], [place] [address]   Dg Chest 2 View  Result Date: 03/13/2017 CLINICAL DATA:  72 y/o  F; generalized weakness. EXAM: CHEST  2 VIEW COMPARISON:  09/02/2015 chest radiograph. FINDINGS: Stable heart size and mediastinal contours are within normal limits. Both lungs are clear. Mild reverse S curvature of the thoracolumbar spine. Stable L2 compression deformity. IMPRESSION: No acute pulmonary process identified. Electronically Signed   By: Kristine Garbe M.D.   On: 03/13/2017 01:44    Pending Labs Unresulted Labs (From admission, onward)   Start     Ordered   03/13/17 0108  Urine culture  STAT,   STAT     03/13/17 0107      Vitals/Pain Today's Vitals   03/13/17 0230 03/13/17 0257 03/13/17 0330 03/13/17 0400  BP: 120/66 120/66 (!) 111/56 (!) 104/58  Pulse: 85 87 85 86  Resp:  18    Temp:  TempSrc:      SpO2: 96% 96% 95% 93%  PainSc:        Isolation Precautions No active  isolations  Medications Medications  cefTRIAXone (ROCEPHIN) 1 g in dextrose 5 % 50 mL IVPB (1 g Intravenous New Bag/Given 03/13/17 0427)  sodium chloride 0.9 % bolus 500 mL (0 mLs Intravenous Stopped 03/13/17 0359)  potassium chloride SA (K-DUR,KLOR-CON) CR tablet 40 mEq (40 mEq Oral Given 03/13/17 0137)    Mobility

## 2017-03-14 DIAGNOSIS — R634 Abnormal weight loss: Secondary | ICD-10-CM

## 2017-03-14 DIAGNOSIS — Z8551 Personal history of malignant neoplasm of bladder: Secondary | ICD-10-CM

## 2017-03-14 DIAGNOSIS — Z6823 Body mass index (BMI) 23.0-23.9, adult: Secondary | ICD-10-CM | POA: Diagnosis not present

## 2017-03-14 DIAGNOSIS — G20 Parkinson's disease: Secondary | ICD-10-CM

## 2017-03-14 DIAGNOSIS — N3000 Acute cystitis without hematuria: Principal | ICD-10-CM

## 2017-03-14 DIAGNOSIS — Z79899 Other long term (current) drug therapy: Secondary | ICD-10-CM | POA: Diagnosis not present

## 2017-03-14 DIAGNOSIS — B961 Klebsiella pneumoniae [K. pneumoniae] as the cause of diseases classified elsewhere: Secondary | ICD-10-CM | POA: Diagnosis present

## 2017-03-14 DIAGNOSIS — K219 Gastro-esophageal reflux disease without esophagitis: Secondary | ICD-10-CM | POA: Diagnosis present

## 2017-03-14 DIAGNOSIS — E876 Hypokalemia: Secondary | ICD-10-CM | POA: Diagnosis present

## 2017-03-14 DIAGNOSIS — E860 Dehydration: Secondary | ICD-10-CM | POA: Diagnosis not present

## 2017-03-14 DIAGNOSIS — I10 Essential (primary) hypertension: Secondary | ICD-10-CM | POA: Diagnosis present

## 2017-03-14 DIAGNOSIS — Z9989 Dependence on other enabling machines and devices: Secondary | ICD-10-CM | POA: Diagnosis not present

## 2017-03-14 DIAGNOSIS — Z791 Long term (current) use of non-steroidal anti-inflammatories (NSAID): Secondary | ICD-10-CM | POA: Diagnosis not present

## 2017-03-14 DIAGNOSIS — G4730 Sleep apnea, unspecified: Secondary | ICD-10-CM | POA: Diagnosis present

## 2017-03-14 DIAGNOSIS — E440 Moderate protein-calorie malnutrition: Secondary | ICD-10-CM | POA: Diagnosis present

## 2017-03-14 DIAGNOSIS — R531 Weakness: Secondary | ICD-10-CM | POA: Diagnosis not present

## 2017-03-14 LAB — CBC
HCT: 37.8 % (ref 36.0–46.0)
Hemoglobin: 12.2 g/dL (ref 12.0–15.0)
MCH: 30 pg (ref 26.0–34.0)
MCHC: 32.3 g/dL (ref 30.0–36.0)
MCV: 93.1 fL (ref 78.0–100.0)
PLATELETS: 282 10*3/uL (ref 150–400)
RBC: 4.06 MIL/uL (ref 3.87–5.11)
RDW: 14.3 % (ref 11.5–15.5)
WBC: 9.2 10*3/uL (ref 4.0–10.5)

## 2017-03-14 LAB — COMPREHENSIVE METABOLIC PANEL
ALK PHOS: 79 U/L (ref 38–126)
ALT: 7 U/L — AB (ref 14–54)
AST: 25 U/L (ref 15–41)
Albumin: 2.5 g/dL — ABNORMAL LOW (ref 3.5–5.0)
Anion gap: 11 (ref 5–15)
BUN: 9 mg/dL (ref 6–20)
CALCIUM: 7.2 mg/dL — AB (ref 8.9–10.3)
CHLORIDE: 109 mmol/L (ref 101–111)
CO2: 19 mmol/L — ABNORMAL LOW (ref 22–32)
CREATININE: 1.09 mg/dL — AB (ref 0.44–1.00)
GFR, EST AFRICAN AMERICAN: 57 mL/min — AB (ref >60)
GFR, EST NON AFRICAN AMERICAN: 49 mL/min — AB (ref >60)
Glucose, Bld: 142 mg/dL — ABNORMAL HIGH (ref 65–99)
Potassium: 3.7 mmol/L (ref 3.5–5.1)
Sodium: 139 mmol/L (ref 135–145)
Total Bilirubin: 0.4 mg/dL (ref 0.3–1.2)
Total Protein: 6.6 g/dL (ref 6.5–8.1)

## 2017-03-14 LAB — MAGNESIUM: MAGNESIUM: 1.8 mg/dL (ref 1.7–2.4)

## 2017-03-14 MED ORDER — SODIUM CHLORIDE 0.9 % IV SOLN
1.0000 g | INTRAVENOUS | Status: DC
  Administered 2017-03-15: 1 g via INTRAVENOUS
  Filled 2017-03-14: qty 1

## 2017-03-14 NOTE — Evaluation (Signed)
Physical Therapy Evaluation Patient Details Name: Tara Ingram MRN: [id number] DOB: [birth year] Today's Date: 03/14/2017   History of Present Illness  Tara Ingram is a 72 y.o. female with medical history significant of bladder cancer, GERD, parkinson's disease.  Patient presents to the ED 03/13/17  with generalized weakness and nausea and weight loss. Found to have UTI.  Clinical Impression  The patient presents with shuffling gait, Requires RW for stability. The patient should progress to return home. Pt admitted with above diagnosis. Pt currently with functional limitations due to the deficits listed below (see PT Problem List).  Pt will benefit from skilled PT to increase their independence and safety with mobility to allow discharge to the venue listed below.    Recommend OT consult.     Follow Up Recommendations Home health PT    Equipment Recommendations  None recommended by PT    Recommendations for Other Services   OT.    Precautions / Restrictions Precautions Precautions: Fall      Mobility  Bed Mobility Overal bed mobility: Needs Assistance Bed Mobility: Supine to Sit;Sit to Supine     Supine to sit: Min assist Sit to supine: Min assist   General bed mobility comments: extra time, assist with the trunk   Transfers Overall transfer level: Needs assistance Equipment used: Rolling walker (2 wheeled) Transfers: Sit to/from Stand Sit to Stand: Min assist         General transfer comment: extra time, stands from bed and BSC, cues  for safety  Ambulation/Gait Ambulation/Gait assistance: Min assist Ambulation Distance (Feet): 80 Feet Assistive device: Rolling walker (2 wheeled) Gait Pattern/deviations: Step-to pattern;Festinating;Decreased stride length;Shuffle     General Gait Details: shuffling gait, assist for turning  with RW.  Stairs            Wheelchair Mobility    Modified Rankin (Stroke Patients Only)       Balance Overall  balance assessment: History of Falls;Needs assistance Sitting-balance support: No upper extremity supported;Feet supported Sitting balance-Leahy Scale: Fair     Standing balance support: During functional activity;Bilateral upper extremity supported Standing balance-Leahy Scale: Poor                               Pertinent Vitals/Pain Pain Assessment: Faces Faces Pain Scale: Hurts even more Pain Location: back Pain Descriptors / Indicators: Discomfort;Grimacing;Guarding;Sharp Pain Intervention(s): Monitored during session    Home Living Family/patient expects to be discharged to:: Private residence Living Arrangements: Spouse/significant other Available Help at Discharge: Family Type of Home: House Home Access: Stairs to enter Entrance Stairs-Rails: Psychiatric nurse of Steps: English: Two level;Able to live on main level with bedroom/bathroom Home Equipment: Gilford Rile - 2 wheels;Cane - single point      Prior Function Level of Independence: Needs assistance   Gait / Transfers Assistance Needed: uses RW, needs assist for  steps           Hand Dominance        Extremity/Trunk Assessment   Upper Extremity Assessment Upper Extremity Assessment: Generalized weakness    Lower Extremity Assessment Lower Extremity Assessment: Generalized weakness    Cervical / Trunk Assessment Cervical / Trunk Assessment: Kyphotic  Communication   Communication: No difficulties  Cognition Arousal/Alertness: Awake/alert Behavior During Therapy: WFL for tasks assessed/performed Overall Cognitive Status: Within Functional Limits for tasks assessed  General Comments      Exercises     Assessment/Plan    PT Assessment Patient needs continued PT services  PT Problem List Decreased strength;Decreased range of motion;Decreased knowledge of use of DME;Decreased activity tolerance;Decreased  safety awareness;Decreased balance;Decreased mobility;Pain       PT Treatment Interventions DME instruction;Gait training;Balance training;Functional mobility training;Stair training;Therapeutic activities;Therapeutic exercise;Patient/family education    PT Goals (Current goals can be found in the Care Plan section)  Acute Rehab PT Goals Patient Stated Goal: to go home PT Goal Formulation: With patient Time For Goal Achievement: 03/28/17 Potential to Achieve Goals: Good    Frequency Min 3X/week   Barriers to discharge        Co-evaluation               AM-PAC PT "6 Clicks" Daily Activity  Outcome Measure Difficulty turning over in bed (including adjusting bedclothes, sheets and blankets)?: A Lot Difficulty moving from lying on back to sitting on the side of the bed? : A Lot Difficulty sitting down on and standing up from a chair with arms (e.g., wheelchair, bedside commode, etc,.)?: A Lot Help needed moving to and from a bed to chair (including a wheelchair)?: A Lot Help needed walking in hospital room?: Total Help needed climbing 3-5 steps with a railing? : Total 6 Click Score: 10    End of Session Equipment Utilized During Treatment: Gait belt Activity Tolerance: Patient tolerated treatment well Patient left: in bed;with call bell/phone within reach;with bed alarm set Nurse Communication: Mobility status PT Visit Diagnosis: Difficulty in walking, not elsewhere classified (R26.2)    Time: 4888-9169 PT Time Calculation (min) (ACUTE ONLY): 18 min   Charges:   PT Evaluation $PT Eval Low Complexity: 1 Low     PT G CodesTresa Endo PT [phone]   Claretha Cooper 03/14/2017, 11:19 AM

## 2017-03-14 NOTE — Progress Notes (Signed)
Spoke with patient at bedside. States she lives at home with her husband, she is not sure she wants [facility] services. She states she has gone to OP PT prior to admission. Her family is in town now assisting her and her husband at home. She says long term plan is to move to [facility] with her family so they can continue to assist her. She wants to get home and get affairs in order first. She agreed after discussing that she would benefit from [place], has used [facility] in the past and would like to use them again. Contacted [facility] for referral. No need for additional DME, has RW and cane. [phone]

## 2017-03-14 NOTE — Progress Notes (Signed)
PROGRESS NOTE    Tara Ingram  FBP:[id number] DOB: May 02, 1945 DOA: 03/13/2017 PCP: Esaw Grandchild, NP   Brief Narrative:  72 year old female with a history of bladder cancer underwent surgical treatment/BCG about a year ago with Dr. Louis Meckel, GERD, Parkinson's came to the ER with complaints of generalized weakness and nausea for about 3 weeks.  She also reported of 30 pounds of weight loss secondary to not eating much.  Upon admission she was diagnosed with urinary tract infection therefore started on Rocephin.  She has been evaluated by physical therapy who recommends home PT.   Assessment & Plan:   Principal Problem:   UTI (urinary tract infection) Active Problems:   Parkinson's disease ([place])   History of bladder cancer   Unintentional weight loss  Generalized weakness and urinary tract infection -Currently patient is on IV Rocephin, gentle hydration as needed - Her vitals appears to be stable -Urine culture is growing Klebsiella pneumonia, susceptibilities are pending -Continue to provide supportive care -Physical therapy recommends home PT.  Will ask case manager to make arrangements  Weight loss greater than 20 pounds in a month - Reports this is due to poor oral intake -She had a colonoscopy back in 2017 which showed a large polyp and was advised to get a repeat flex sigmoidoscopy in 6-12 months from then but patient failed to follow-up.  I have advised her to follow-up with gastroenterology to get this done outpatient.  Denies any rectal bleeding  History of Parkinson's disease -She can resume home medications  Protein calorie malnutrition-mild to moderate -Encourage oral intake, supplements as necessary -Dietitian consult   DVT prophylaxis: Lovenox Code Status: Full code Family Communication: None at bedside Disposition Plan: Maintain inpatient hospital stay for another 24 hours  It is my clinical opinion that admission to INPATIENT is reasonable and necessary  in this 72 y.o. female . presenting with symptoms of Generalized weakness, concerning for Deconditioning worsenec with active infection  . in the context of PMH including: [place], dementia . with pertinent positives on physical exam including: Generally weak appearing  . and pertinent positives on radiographic and laboratory data including: UA- + UTI . Workup and treatment include Abx and IVF.   Given the aforementioned, the predictability of an adverse outcome is felt to be significant. I expect that the patient will require at least 2 midnights in the hospital to treat this condition.    Subjective: Patient still continues to feel generalized weakness and has poor p.o. intake.  Remains afebrile overnight.  No other complaints.  Objective: Vitals:   03/13/17 0605 03/13/17 1408 03/13/17 2057 03/14/17 0444  BP:  120/63 129/73 135/74  Pulse:  92 89 84  Resp:  16 16 16   Temp:  98 F (36.7 C) 98.5 F (36.9 C) 99 F (37.2 C)  TempSrc:  Oral Oral Oral  SpO2:  98% 98% 99%  Weight: 60.2 kg (132 lb 11.5 oz)     Height: 5\' 3"  (1.6 m)       Intake/Output Summary (Last 24 hours) at 03/14/2017 1222 Last data filed at 03/14/2017 0600 Gross per 24 hour  Intake 1150 ml  Output 900 ml  Net 250 ml   Filed Weights   03/13/17 0605  Weight: 60.2 kg (132 lb 11.5 oz)    Examination:  General exam: She is calm and comfortable but generally weak appearing, elderly frail with some temporal wasting Respiratory system: Clear to auscultation. Respiratory effort normal. Cardiovascular system: S1 & S2 heard, RRR.  No JVD, murmurs, rubs, gallops or clicks. No pedal edema. Gastrointestinal system: Abdomen is nondistended, soft and nontender. No organomegaly or masses felt. Normal bowel sounds heard. Central nervous system: Alert and oriented. No focal neurological deficits. Extremities: Symmetric 4 x 5 power. Skin: No rashes, lesions or ulcers Psychiatry: Judgement and insight appear normal. Mood  & affect appropriate.     Data Reviewed:   CBC: Recent Labs  Lab 03/12/17 1953 03/13/17 0938 03/14/17 1044  WBC 16.2* 14.5* 9.2  NEUTROABS  --  12.0*  --   HGB 13.9 12.6 12.2  HCT 41.7 38.2 37.8  MCV 91.6 92.5 93.1  PLT 333 283 546   Basic Metabolic Panel: Recent Labs  Lab 03/12/17 1953 03/13/17 0938 03/14/17 1044  NA 135 133* 139  K 3.1* 3.4* 3.7  CL 98* 99* 109  CO2 26 24 19*  GLUCOSE 122* 141* 142*  BUN 15 13 9   CREATININE 1.29* 1.31* 1.09*  CALCIUM 7.8* 7.1* 7.2*  MG  --  1.9 1.8   GFR: Estimated Creatinine Clearance: 38 mL/min (A) (by C-G formula based on SCr of 1.09 mg/dL (H)). Liver Function Tests: Recent Labs  Lab 03/12/17 1953 03/14/17 1044  AST 24 25  ALT 8* 7*  ALKPHOS 99 79  BILITOT 0.3 0.4  PROT 8.1 6.6  ALBUMIN 3.0* 2.5*   No results for input(s): LIPASE, AMYLASE in the last 168 hours. No results for input(s): AMMONIA in the last 168 hours. Coagulation Profile: No results for input(s): INR, PROTIME in the last 168 hours. Cardiac Enzymes: No results for input(s): CKTOTAL, CKMB, CKMBINDEX, TROPONINI in the last 168 hours. BNP (last 3 results) No results for input(s): PROBNP in the last 8760 hours. HbA1C: No results for input(s): HGBA1C in the last 72 hours. CBG: No results for input(s): GLUCAP in the last 168 hours. Lipid Profile: No results for input(s): CHOL, HDL, LDLCALC, TRIG, CHOLHDL, LDLDIRECT in the last 72 hours. Thyroid Function Tests: No results for input(s): TSH, T4TOTAL, FREET4, T3FREE, THYROIDAB in the last 72 hours. Anemia Panel: No results for input(s): VITAMINB12, FOLATE, FERRITIN, TIBC, IRON, RETICCTPCT in the last 72 hours. Sepsis Labs: No results for input(s): PROCALCITON, LATICACIDVEN in the last 168 hours.  Recent Results (from the past 240 hour(s))  Urine culture     Status: Abnormal (Preliminary result)   Collection Time: 03/13/17  3:19 AM  Result Value Ref Range Status   Specimen Description   Final     URINE, RANDOM Performed at [facility] [address], [place], [place] [address]    Special Requests   Final    NONE Performed at [facility], [place] [address] [place], [place] [address]    Culture >=100,000 COLONIES/mL KLEBSIELLA PNEUMONIAE (A)  Final   Report Status PENDING  Incomplete  Culture, blood (routine x 2)     Status: None (Preliminary result)   Collection Time: 03/13/17  9:38 AM  Result Value Ref Range Status   Specimen Description   Final    BLOOD LEFT ANTECUBITAL Performed at [facility], [place] [address] [place], [place] [address]    Special Requests   Final    IN PEDIATRIC BOTTLE Blood Culture adequate volume Performed at [facility] [address] [place], [place] [address]    Culture   Final    NO GROWTH < 24 HOURS Performed at [facility] [address], [place], [place] [address]    Report Status  PENDING  Incomplete  Culture, blood (routine x 2)     Status: None (Preliminary result)   Collection Time: 03/13/17  9:39 AM  Result Value Ref Range Status   Specimen Description   Final    BLOOD LEFT ANTECUBITAL Performed at [facility], [place] [address], [place], [place] [address]    Special Requests   Final    IN PEDIATRIC BOTTLE Blood Culture adequate volume Performed at [facility] [address] [place], [place] [address]    Culture   Final    NO GROWTH < 24 HOURS Performed at [place] [facility] [address], [place], [place] [address]    Report Status PENDING  Incomplete         Radiology Studies: Dg Chest 2 View  Result Date: 03/13/2017 CLINICAL DATA:  72 y/o  F; generalized weakness. EXAM: CHEST  2 VIEW COMPARISON:  09/02/2015 chest radiograph. FINDINGS: Stable heart size and mediastinal contours are within normal limits. Both lungs are clear. Mild reverse S curvature of the thoracolumbar spine. Stable L2 compression deformity. IMPRESSION:  No acute pulmonary process identified. Electronically Signed   By: Kristine Garbe M.D.   On: 03/13/2017 01:44   US Renal  Result Date: 03/13/2017 CLINICAL DATA:  Acute renal failure.  History of bladder cancer. EXAM: RENAL / URINARY TRACT ULTRASOUND COMPLETE COMPARISON:  CT, 08/15/2015 FINDINGS: Right Kidney: Length: 10.3 cm. Echogenicity within normal limits. No mass or hydronephrosis visualized. Left Kidney: Length: 9.2 cm. Echogenicity within normal limits. No mass or hydronephrosis visualized. Bladder: Appears normal for degree of bladder distention. IMPRESSION: Normal renal ultrasound. Electronically Signed   By: Lajean Manes M.D.   On: 03/13/2017 19:01        Scheduled Meds: . enoxaparin (LOVENOX) injection  40 mg Subcutaneous Q24H  . mirabegron ER  50 mg Oral Daily  . rotigotine  1 patch Transdermal Daily   Continuous Infusions: . sodium chloride 75 mL/hr at 03/13/17 0556  . [START ON 03/15/2017] cefTRIAXone (ROCEPHIN)  IV       LOS: 0 days    Time spent: 35 mins    Zyla Dascenzo Arsenio Loader, MD [facility] Pager [phone]   If 7PM-7AM, please contact night-coverage [email or web address] Password [facility] 03/14/2017, 12:22 PM

## 2017-03-14 NOTE — Plan of Care (Signed)
Reviewed plan of care with pt, specifically pain control r/t arthritis, safety, and IV ABX administration during the night. Pt attentive and verbalized understanding of all education.

## 2017-03-15 DIAGNOSIS — R531 Weakness: Secondary | ICD-10-CM

## 2017-03-15 LAB — COMPREHENSIVE METABOLIC PANEL
ALBUMIN: 2.4 g/dL — AB (ref 3.5–5.0)
ALT: 7 U/L — ABNORMAL LOW (ref 14–54)
ANION GAP: 9 (ref 5–15)
AST: 18 U/L (ref 15–41)
Alkaline Phosphatase: 74 U/L (ref 38–126)
BUN: 8 mg/dL (ref 6–20)
CHLORIDE: 111 mmol/L (ref 101–111)
CO2: 20 mmol/L — ABNORMAL LOW (ref 22–32)
Calcium: 7 mg/dL — ABNORMAL LOW (ref 8.9–10.3)
Creatinine, Ser: 1.01 mg/dL — ABNORMAL HIGH (ref 0.44–1.00)
GFR calc Af Amer: >60 mL/min (ref >60)
GFR, EST NON AFRICAN AMERICAN: 54 mL/min — AB (ref >60)
Glucose, Bld: 101 mg/dL — ABNORMAL HIGH (ref 65–99)
POTASSIUM: 3.8 mmol/L (ref 3.5–5.1)
Sodium: 140 mmol/L (ref 135–145)
TOTAL PROTEIN: 6.3 g/dL — AB (ref 6.5–8.1)
Total Bilirubin: 0.4 mg/dL (ref 0.3–1.2)

## 2017-03-15 LAB — URINE CULTURE: Culture: >=100000 — AB

## 2017-03-15 LAB — MAGNESIUM: Magnesium: 1.6 mg/dL — ABNORMAL LOW (ref 1.7–2.4)

## 2017-03-15 LAB — CBC
HEMATOCRIT: 35.6 % — AB (ref 36.0–46.0)
HEMOGLOBIN: 11.7 g/dL — AB (ref 12.0–15.0)
MCH: 30.7 pg (ref 26.0–34.0)
MCHC: 32.9 g/dL (ref 30.0–36.0)
MCV: 93.4 fL (ref 78.0–100.0)
Platelets: 287 10*3/uL (ref 150–400)
RBC: 3.81 MIL/uL — ABNORMAL LOW (ref 3.87–5.11)
RDW: 14.3 % (ref 11.5–15.5)
WBC: 8.8 10*3/uL (ref 4.0–10.5)

## 2017-03-15 MED ORDER — CIPROFLOXACIN HCL 500 MG PO TABS: 500.0000 mg | ORAL_TABLET | Freq: Two times a day (BID) | ORAL | 0 refills | Status: AC

## 2017-03-15 NOTE — Discharge Summary (Signed)
Physician Discharge Summary  Tara Ingram KNL:[id number] DOB: 07/09/1945 DOA: 03/13/2017  PCP: Esaw Grandchild, NP  Admit date: 03/13/2017 Discharge date: 03/15/2017  Admitted From: Home Disposition: Home with Home PT  Recommendations for Outpatient Follow-up:  1. Follow up with PCP in 1-2 weeks. Advised for outpatient work up for weight loss to ensure she had all of her routine cancer screening test.  2. Take Cipro as prescribed orally twice daily.  3. Follow-up with gastroenterology in about 10 days.  Home Health: PT Equipment/Devices: None Discharge Condition: Stable CODE STATUS: Full  Diet recommendation: Heart Healthy  Brief/Interim Summary: 72 year old female with a history of bladder cancer underwent surgical treatment/BCG about a year ago with Dr. Lynann Bologna, GERD, Parkinson's came to the ER with complains of generalized weakness and nausea for about 3 weeks.  She also reported of about 30 pounds of weight loss secondary to poor appetite and not eating much.  Upon admission she was found to have urinary tract infection and started on Rocephin.  She was evaluated by physical therapy who recommended home PT and the arrangements were made by the case manager.  She continued to receive her IV antibiotics and hydration.  Urine cultures eventually grew Klebsiella which was sensitive to oral Cipro.  She was transitioned to oral antibiotic and discharged on it. During her stay she also reported of about 20 pounds of weight loss in last several weeks and she attributes to not eating and drinking for several weeks much due to poor appetite.  She had a colonoscopy back in 2017 which showed a large polyp and was supposed to get her a repeat flex sigmoidoscopy in about 6-12 weeks but failed to follow-up.  I have advised her to follow-up with gastroenterology outpatient.  No active signs of bleeding or other complaints. Reached maximum benefit from in hospital stay and ready to be discharged. No acute  events overngiht, no complaints this morning and is eager to go home.   Discharge Diagnoses:  Principal Problem:   UTI (urinary tract infection) Active Problems:   Parkinson's disease ([place])   History of bladder cancer   Unintentional weight loss  Generalized weakness Urinary tract infection-Klebsiella pneumonia - IV Rocephin is to be transitioned to oral Cipro.  Advised to continue oral hydration at home - Home physical therapy arrangements made by the case management.  Weight loss of about 20 pounds in last 4-8 weeks -Secondary to poor oral intake.  Needs to follow-up with primary care outpatient to ensure she has had proper screening for malignancy.  She is also due for sigmoidoscopy/colonoscopy therefore needs to follow-up with gastroenterology  History of Parkinson's disease -Resume home medications  Essential hypertension -Continue losartan  Discharge Instructions   Allergies as of 03/15/2017   No Known Allergies     Medication List    TAKE these medications   ciprofloxacin 500 MG tablet Commonly known as:  CIPRO Take 1 tablet (500 mg total) by mouth 2 (two) times daily for 5 days.   denosumab 60 MG/ML Soln injection Commonly known as:  PROLIA Inject 60 mg into the skin every 6 (six) months. Administer in upper arm, thigh, or abdomen   diclofenac sodium 1 % Gel Commonly known as:  VOLTAREN Apply 1 application topically 2 (two) times daily as needed (pain).   losartan 100 MG tablet Commonly known as:  COZAAR Take 100 mg by mouth daily.   mirabegron ER 50 MG Tb24 tablet Commonly known as:  MYRBETRIQ Take 50 mg by  mouth daily.   oxyCODONE-acetaminophen 7.5-325 MG tablet Commonly known as:  PERCOCET Take 1 tablet by mouth every 6 (six) hours as needed for moderate pain or severe pain.   rotigotine 6 MG/24HR Commonly known as:  NEUPRO Place 1 patch onto the skin daily.   tiZANidine 2 MG tablet Commonly known as:  ZANAFLEX Take 2 mg by mouth at bedtime as  needed for muscle spasms.      Follow-up Information    [facility]   Specialty:  [facility]:  physical therapy and occupational therapy Contact information: [address] [place] [address] [phone]        Esaw Grandchild, NP. Schedule an appointment as soon as possible for a visit in 10 day(s).   Specialty:  Family Medicine Contact information: [place] [address] [phone]          No Known Allergies  On your next visit with your primary care physician please Get Medicines reviewed and adjusted.   Please request your Prim.MD to go over all Hospital Tests and Procedure/Radiological results at the follow up, please get all Hospital records sent to your Prim MD by signing hospital release before you go home.   If you experience worsening of your admission symptoms, develop shortness of breath, life threatening emergency, suicidal or homicidal thoughts you must seek medical attention immediately by calling 911 or calling your MD immediately  if symptoms less severe.  You Must read complete instructions/literature along with all the possible adverse reactions/side effects for all the Medicines you take and that have been prescribed to you. Take any new Medicines after you have completely understood and accpet all the possible adverse reactions/side effects.   Do not drive, operate heavy machinery, perform activities at heights, swimming or participation in water activities or provide baby sitting services if your were admitted for syncope or siezures until you have seen by Primary MD or a Neurologist and advised to do so again.  Do not drive when taking Pain medications.    Do not take more than prescribed Pain, Sleep and Anxiety Medications  Special Instructions: If you have smoked or chewed Tobacco  in the last 2 yrs please stop smoking, stop any regular Alcohol  and or any  Recreational drug use.  Wear Seat belts while driving.   Please note  You were cared for by a hospitalist during your hospital stay. If you have any questions about your discharge medications or the care you received while you were in the hospital after you are discharged, you can call the unit and asked to speak with the hospitalist on call if the hospitalist that took care of you is not available. Once you are discharged, your primary care physician will handle any further medical issues. Please note that NO REFILLS for any discharge medications will be authorized once you are discharged, as it is imperative that you return to your primary care physician (or establish a relationship with a primary care physician if you do not have one) for your aftercare needs so that they can reassess your need for medications and monitor your lab values.   Increase activity slowly        Consultations: None  Procedures/Studies: Dg Chest 2 View  Result Date: 03/13/2017 CLINICAL DATA:  72 y/o  F; generalized weakness. EXAM: CHEST  2 VIEW COMPARISON:  09/02/2015 chest radiograph. FINDINGS: Stable heart size and mediastinal contours are within normal limits.  Both lungs are clear. Mild reverse S curvature of the thoracolumbar spine. Stable L2 compression deformity. IMPRESSION: No acute pulmonary process identified. Electronically Signed   By: Kristine Garbe M.D.   On: 03/13/2017 01:44   US Renal  Result Date: 03/13/2017 CLINICAL DATA:  Acute renal failure.  History of bladder cancer. EXAM: RENAL / URINARY TRACT ULTRASOUND COMPLETE COMPARISON:  CT, 08/15/2015 FINDINGS: Right Kidney: Length: 10.3 cm. Echogenicity within normal limits. No mass or hydronephrosis visualized. Left Kidney: Length: 9.2 cm. Echogenicity within normal limits. No mass or hydronephrosis visualized. Bladder: Appears normal for degree of bladder distention. IMPRESSION: Normal renal ultrasound. Electronically Signed   By: Lajean Manes M.D.   On: 03/13/2017 19:01       Subjective: No acute events overnight.   Discharge Exam: Vitals:   03/14/17 2052 03/15/17 0532  BP: (!) 127/59 132/68  Pulse:  82  Resp: 17 16  Temp: 98.6 F (37 C) 98.3 F (36.8 C)  SpO2: 96% 100%   Vitals:   03/14/17 0444 03/14/17 1448 03/14/17 2052 03/15/17 0532  BP: 135/74 (!) 125/56 (!) 127/59 132/68  Pulse: 84 88  82  Resp: 16 18 17 16   Temp: 99 F (37.2 C) 98.2 F (36.8 C) 98.6 F (37 C) 98.3 F (36.8 C)  TempSrc: Oral Axillary Oral Oral  SpO2: 99% 97% 96% 100%  Weight:      Height:        General: Pt is alert, awake, not in acute distress Cardiovascular: RRR, S1/S2 +, no rubs, no gallops Respiratory: CTA bilaterally, no wheezing, no rhonchi Abdominal: Soft, NT, ND, bowel sounds + Extremities: no edema, no cyanosis    The results of significant diagnostics from this hospitalization (including imaging, microbiology, ancillary and laboratory) are listed below for reference.     Microbiology: Recent Results (from the past 240 hour(s))  Urine culture     Status: Abnormal   Collection Time: 03/13/17  3:19 AM  Result Value Ref Range Status   Specimen Description   Final    URINE, RANDOM Performed at [facility] [address], [place], [place] [address]    Special Requests   Final    NONE Performed at [facility], [place] [address], [place], [place] [address]    Culture >=100,000 COLONIES/mL KLEBSIELLA PNEUMONIAE (A)  Final   Report Status 03/15/2017 FINAL  Final   Organism ID, Bacteria KLEBSIELLA PNEUMONIAE (A)  Final      Susceptibility   Klebsiella pneumoniae - MIC*    AMPICILLIN RESISTANT Resistant     CEFAZOLIN <=4 SENSITIVE Sensitive     CEFTRIAXONE <=1 SENSITIVE Sensitive     CIPROFLOXACIN <=0.25 SENSITIVE Sensitive     GENTAMICIN <=1 SENSITIVE Sensitive     IMIPENEM <=0.25 SENSITIVE Sensitive     NITROFURANTOIN 64 INTERMEDIATE Intermediate     TRIMETH/SULFA  <=20 SENSITIVE Sensitive     AMPICILLIN/SULBACTAM <=2 SENSITIVE Sensitive     PIP/TAZO <=4 SENSITIVE Sensitive     Extended ESBL NEGATIVE Sensitive     * >=100,000 COLONIES/mL KLEBSIELLA PNEUMONIAE  Culture, blood (routine x 2)     Status: None (Preliminary result)   Collection Time: 03/13/17  9:38 AM  Result Value Ref Range Status   Specimen Description   Final    BLOOD LEFT ANTECUBITAL Performed at [facility] [address], [place], [place] [address]    Special Requests   Final    IN PEDIATRIC BOTTLE Blood Culture adequate  volume Performed at [facility], [place] [address], [place], [place] [address]    Culture   Final    NO GROWTH 2 DAYS Performed at [facility] [address] [place], [place] [address]    Report Status PENDING  Incomplete  Culture, blood (routine x 2)     Status: None (Preliminary result)   Collection Time: 03/13/17  9:39 AM  Result Value Ref Range Status   Specimen Description   Final    BLOOD LEFT ANTECUBITAL Performed at [facility], [place] [address] [place], [place] [address]    Special Requests   Final    IN PEDIATRIC BOTTLE Blood Culture adequate volume Performed at [facility] [address], [place], [place] [address]    Culture   Final    NO GROWTH 2 DAYS Performed at [facility] [address], [place], [place] [address]    Report Status PENDING  Incomplete     Labs: BNP (last 3 results) No results for input(s): BNP in the last 8760 hours. Basic Metabolic Panel: Recent Labs  Lab 03/12/17 1953 03/13/17 0938 03/14/17 1044 03/15/17 0837  NA 135 133* 139 140  K 3.1* 3.4* 3.7 3.8  CL 98* 99* 109 111  CO2 26 24 19* 20*  GLUCOSE 122* 141* 142* 101*  BUN 15 13 9 8   CREATININE 1.29* 1.31* 1.09* 1.01*  CALCIUM 7.8* 7.1* 7.2* 7.0*  MG  --  1.9 1.8 1.6*   Liver Function Tests: Recent Labs  Lab 03/12/17 1953 03/14/17 1044 03/15/17 0837  AST 24 25 18   ALT 8* 7* 7*   ALKPHOS 99 79 74  BILITOT 0.3 0.4 0.4  PROT 8.1 6.6 6.3*  ALBUMIN 3.0* 2.5* 2.4*   No results for input(s): LIPASE, AMYLASE in the last 168 hours. No results for input(s): AMMONIA in the last 168 hours. CBC: Recent Labs  Lab 03/12/17 1953 03/13/17 0938 03/14/17 1044 03/15/17 0837  WBC 16.2* 14.5* 9.2 8.8  NEUTROABS  --  12.0*  --   --   HGB 13.9 12.6 12.2 11.7*  HCT 41.7 38.2 37.8 35.6*  MCV 91.6 92.5 93.1 93.4  PLT 333 283 282 287   Cardiac Enzymes: No results for input(s): CKTOTAL, CKMB, CKMBINDEX, TROPONINI in the last 168 hours. BNP: Invalid input(s): POCBNP CBG: No results for input(s): GLUCAP in the last 168 hours. D-Dimer No results for input(s): DDIMER in the last 72 hours. Hgb A1c No results for input(s): HGBA1C in the last 72 hours. Lipid Profile No results for input(s): CHOL, HDL, LDLCALC, TRIG, CHOLHDL, LDLDIRECT in the last 72 hours. Thyroid function studies No results for input(s): TSH, T4TOTAL, T3FREE, THYROIDAB in the last 72 hours.  Invalid input(s): FREET3 Anemia work up No results for input(s): VITAMINB12, FOLATE, FERRITIN, TIBC, IRON, RETICCTPCT in the last 72 hours. Urinalysis    Component Value Date/Time   COLORURINE YELLOW 03/13/2017 0319   APPEARANCEUR CLOUDY (A) 03/13/2017 0319   LABSPEC 1.012 03/13/2017 0319   PHURINE 6.0 03/13/2017 0319   GLUCOSEU NEGATIVE 03/13/2017 0319   HGBUR SMALL (A) 03/13/2017 0319   BILIRUBINUR NEGATIVE 03/13/2017 0319   KETONESUR NEGATIVE 03/13/2017 0319   PROTEINUR 100 (A) 03/13/2017 0319   NITRITE POSITIVE (A) 03/13/2017 0319   LEUKOCYTESUR LARGE (A) 03/13/2017 0319   Sepsis Labs Invalid input(s): PROCALCITONIN,  WBC,  LACTICIDVEN Microbiology Recent Results (from the past 240 hour(s))  Urine culture     Status: Abnormal  Collection Time: 03/13/17  3:19 AM  Result Value Ref Range Status   Specimen Description   Final    URINE, RANDOM Performed at [facility] [address], [place], [place] [address]    Special Requests   Final    NONE Performed at [facility], [place] [address] [place], [place] [address]    Culture >=100,000 COLONIES/mL KLEBSIELLA PNEUMONIAE (A)  Final   Report Status 03/15/2017 FINAL  Final   Organism ID, Bacteria KLEBSIELLA PNEUMONIAE (A)  Final      Susceptibility   Klebsiella pneumoniae - MIC*    AMPICILLIN RESISTANT Resistant     CEFAZOLIN <=4 SENSITIVE Sensitive     CEFTRIAXONE <=1 SENSITIVE Sensitive     CIPROFLOXACIN <=0.25 SENSITIVE Sensitive     GENTAMICIN <=1 SENSITIVE Sensitive     IMIPENEM <=0.25 SENSITIVE Sensitive     NITROFURANTOIN 64 INTERMEDIATE Intermediate     TRIMETH/SULFA <=20 SENSITIVE Sensitive     AMPICILLIN/SULBACTAM <=2 SENSITIVE Sensitive     PIP/TAZO <=4 SENSITIVE Sensitive     Extended ESBL NEGATIVE Sensitive     * >=100,000 COLONIES/mL KLEBSIELLA PNEUMONIAE  Culture, blood (routine x 2)     Status: None (Preliminary result)   Collection Time: 03/13/17  9:38 AM  Result Value Ref Range Status   Specimen Description   Final    BLOOD LEFT ANTECUBITAL Performed at [facility] [address] [place], [place] [address]    Special Requests   Final    IN PEDIATRIC BOTTLE Blood Culture adequate volume Performed at [facility] [address] [place], [place] [address]    Culture   Final    NO GROWTH 2 DAYS Performed at [facility] [address], [place], [place] [address]    Report Status PENDING  Incomplete  Culture, blood (routine x 2)     Status: None (Preliminary result)   Collection Time: 03/13/17  9:39 AM  Result Value Ref Range Status   Specimen Description   Final    BLOOD LEFT ANTECUBITAL Performed at [facility], [place] [address], [place], [place] [address]    Special Requests   Final    IN PEDIATRIC BOTTLE Blood Culture adequate volume Performed at [facility] [address] [place], [place] [address]     Culture   Final    NO GROWTH 2 DAYS Performed at [facility] [address], [place],  [address]    Report Status PENDING  Incomplete     Time coordinating discharge: Over 30 minutes  SIGNED:   Damita Lack, MD  [facility] 03/15/2017, 10:51 AM Pager   If 7PM-7AM, please contact night-coverage [email or web address] Password [email or web address]

## 2017-03-18 ENCOUNTER — Encounter: Payer: Self-pay | Admitting: Nurse Practitioner

## 2017-03-18 LAB — CULTURE, BLOOD (ROUTINE X 2)
CULTURE: NO GROWTH
CULTURE: NO GROWTH
SPECIAL REQUESTS: ADEQUATE
SPECIAL REQUESTS: ADEQUATE

## 2017-03-20 NOTE — Progress Notes (Signed)
Subjective:    Patient ID: Tara Ingram, female    DOB: [birth year], 72 y.o.   MRN: [id number]  HPI:  Tara Ingram is here to establish as a new pt.  She is a pleasant 72 yr old female.   PMH: HTN, OSA, GERD, HL., Bladder Ca (TURBT 10/17/15) Parkinson's Disease, and recent hospitalization for acute cystitis w/o hematuria, as well as, dehydration/hypokalemia,and renal failure  She completed Cirpo 500mg  BID x 5 days Referral to GI/Dr. Rande Brunt for colonoscopy She is followed by [facility], has OV next week She reports slowly improving appetite since hospitalization. She had 20-30 unintentional lb weight prior to hospitalization. Se is followed by Neuro for Parkinson's, treated with rotigotine 6mg /24hr transdermal patch. She reports weakness and instability with recent hx of falls. Husband at [facility] during [place]  Patient Care Team    Relationship Specialty Notifications Start End  Mina Marble D, NP PCP - General Family Medicine  02/21/17   Bjorn Pippin, PA-C Physician Assistant Emergency Medicine  03/23/17   Hennie Duos, MD Consulting Physician Rheumatology  03/23/17   Melvenia Beam, MD Consulting Physician Neurology  03/23/17   Rachelle Hora, MD Attending Physician Gastroenterology  03/23/17   Druscilla Brownie, MD Consulting Physician Dermatology  03/23/17   Margaretha Sheffield, MD Referring Physician Physical Medicine and Rehabilitation  03/23/17   Ardis Hughs, MD Attending Physician Urology  03/23/17   Marilynne Halsted, MD Referring Physician Ophthalmology  03/23/17   Guido Sander, PT Physical Therapist Physical Therapy  03/23/17     Patient Active Problem List   Diagnosis Date Noted  . Healthcare maintenance 03/23/2017  . Hx of colonic polyps 03/23/2017  . Essential hypertension 03/23/2017  . UTI (urinary tract infection) 03/13/2017  . History of bladder cancer 03/13/2017  . Unintentional weight loss 03/13/2017  . Parkinson's disease ([place]) 11/16/2016  .  Gait abnormality 10/06/2016  . Resting tremor 10/06/2016  . Malignant tumor of trigone of bladder ([place]) 09/18/2015  . OSA (obstructive sleep apnea) 12/30/2014  . Closed wedge compression fracture of second lumbar vertebra ([place])   . Mechanical complication due to intraocular lens implant 11/18/2010  . Nuclear cataract 11/18/2010  . DIARRHEA 04/17/2010     Past Medical History:  Diagnosis Date  . Allergy   . Arthritis    hands/feet/back ra  . Bladder cancer ([place])   . Cataract    bilateral  . GERD (gastroesophageal reflux disease)   . Headache   . History of kidney stones 50 yrs ago  . Hyperlipidemia   . Hypertension   . Sleep apnea    uses CPAP every night setting of 12     Past Surgical History:  Procedure Laterality Date  . APPENDECTOMY    . BACK SURGERY  05/2014   lower back for crushed disc  . CHOLECYSTECTOMY    . COLONOSCOPY  12/2011   hx polyps/jacobs  . CORNEAL TRANSPLANT     bilateral  . CYSTOSCOPY W/ RETROGRADES Bilateral 09/18/2015   Procedure: BILATERAL RETROGRADE PYELOGRAM;  Surgeon: Ardis Hughs, MD;  Location: [facility];  Service: Urology;  Laterality: Bilateral;  . CYSTOSCOPY W/ RETROGRADES Right 10/17/2015   Procedure: CYSTOSCOPY WITH RETROGRADE PYELOGRAM;  Surgeon: Ardis Hughs, MD;  Location: [facility];  Service: Urology;  Laterality: Right;  . EYE SURGERY Bilateral    lens replacement for cataract  . FOOT SURGERY Left 6 yrs ago   tammer toe reconstruction  . PARTIAL HYSTERECTOMY    .  TONSILLECTOMY    . TRANSURETHRAL RESECTION OF BLADDER TUMOR N/A 09/18/2015   Procedure: TRANSURETHRAL RESECTION OF BLADDER TUMOR (TURBT);  Surgeon: Ardis Hughs, MD;  Location: [facility];  Service: Urology;  Laterality: N/A;  . TRANSURETHRAL RESECTION OF BLADDER TUMOR N/A 10/17/2015   Procedure: TRANSURETHRAL RESECTION OF BLADDER TUMOR (TURBT);  Surgeon: Ardis Hughs, MD;  Location: [facility];  Service: Urology;  Laterality: N/A;     Family History  Problem  Relation Age of Onset  . Colon cancer Sister 61  . Allergies Mother   . Heart disease Mother   . Rheumatologic disease Mother   . Heart attack Mother   . Allergies Father   . Asthma Father   . Heart disease Father   . Heart attack Father   . Esophageal cancer Neg Hx   . Colon polyps Neg Hx   . Rectal cancer Neg Hx   . Stomach cancer Neg Hx      Social History   Substance and Sexual Activity  Drug Use No     Social History   Substance and Sexual Activity  Alcohol Use No  . Alcohol/week: 0.0 oz     Social History   Tobacco Use  Smoking Status Never Smoker  Smokeless Tobacco Never Used     Outpatient Encounter Medications as of 03/23/2017  Medication Sig  . denosumab (PROLIA) 60 MG/ML SOLN injection Inject 60 mg into the skin every 6 (six) months. Administer in upper arm, thigh, or abdomen  . diclofenac sodium (VOLTAREN) 1 % GEL Apply 1 application topically 2 (two) times daily as needed (pain).   Marland Kitchen losartan (COZAAR) 100 MG tablet Take 1 tablet (100 mg total) by mouth daily.  . mirabegron ER (MYRBETRIQ) 50 MG TB24 tablet Take 50 mg by mouth daily.  Marland Kitchen oxyCODONE-acetaminophen (PERCOCET) 7.5-325 MG tablet Take 1 tablet by mouth every 6 (six) hours as needed for moderate pain or severe pain.   . rotigotine (NEUPRO) 6 MG/24HR Place 1 patch onto the skin daily.  Marland Kitchen tiZANidine (ZANAFLEX) 2 MG tablet Take 2 mg by mouth at bedtime as needed for muscle spasms.   . [DISCONTINUED] losartan (COZAAR) 100 MG tablet Take 100 mg by mouth daily.   . [EXPIRED] ciprofloxacin (CIPRO) 500 MG tablet Take 1 tablet (500 mg total) by mouth 2 (two) times daily for 5 days.   No facility-administered encounter medications on file as of 03/23/2017.     Allergies: Patient has no known allergies.  Body mass index is 24.81 kg/m.  Blood pressure 90/76, pulse 78, resp. rate 16, height 5' 2.99" (1.6 m), weight 140 lb (63.5 kg).   Review of Systems  Constitutional: Positive for activity change,  appetite change and fatigue. Negative for chills, diaphoresis, fever and unexpected weight change.  Respiratory: Negative for cough, chest tightness, shortness of breath, wheezing and stridor.   Cardiovascular: Negative for chest pain, palpitations and leg swelling.  Gastrointestinal: Negative for abdominal distention, anal bleeding, blood in stool, constipation, diarrhea, nausea and vomiting.  Endocrine: Negative for cold intolerance, heat intolerance, polyphagia and polyuria.  Genitourinary: Negative for difficulty urinating, dysuria, flank pain, frequency, hematuria and urgency.  Musculoskeletal: Positive for arthralgias, back pain, gait problem, joint swelling, myalgias, neck pain and neck stiffness.  Skin: Positive for pallor. Negative for color change, rash and wound.  Neurological: Positive for tremors and weakness. Negative for dizziness, seizures, facial asymmetry and speech difficulty.       Decreased memory  Hematological: Does not bruise/bleed easily.  Psychiatric/Behavioral: Negative for decreased concentration, hallucinations, self-injury, sleep disturbance and suicidal ideas. The patient is not nervous/anxious and is not hyperactive.        Objective:   Physical Exam  Constitutional: She is oriented to person, place, and time. She appears well-developed and well-nourished.  HENT:  Head: Normocephalic and atraumatic.  Left Ear: External ear normal.  Eyes: Conjunctivae are normal. Pupils are equal, round, and reactive to light.  Cardiovascular: Normal rate, regular rhythm, normal heart sounds and intact distal pulses.  No murmur heard. Pulmonary/Chest: Effort normal and breath sounds normal. No respiratory distress. She has no wheezes. She has no rales. She exhibits no tenderness.  Neurological: She is alert and oriented to person, place, and time. She exhibits abnormal muscle tone. Coordination abnormal.  Very slow, shuffled gait  Skin: She is not diaphoretic. There is  pallor.  Psychiatric: She has a normal mood and affect. Her behavior is normal. Judgment and thought content normal.          Assessment & Plan:   1. Essential hypertension   2. Parkinson's disease ([place])   3. History of bladder cancer   4. Hx of colonic polyps   5. Healthcare maintenance   6. Malignant tumor of trigone of bladder ([place])   7. Acute cystitis without hematuria     Essential hypertension BP low in clinic- 90/76, HR  She denies CP/dyspnea/palpitations/dizziness She has been taking Losartan 100mg  QD for years, denies SE Instructed to check BP/HR tonight and tomorrow am and call clinic with readings. Red Flag ex's discussed and when to seek immediate medica care.  Parkinson's disease ([place]) Dx'd 2017 Followed by Neurology, taking Rotigotine 6mg /24hr transdermal patch Reports difficulty with ambulation even with cane  Malignant tumor of trigone of bladder ([place]) Treated with TURBT Followed by [facility]- records requested  Has OV next week  UTI (urinary tract infection) Hospitalized 03/13/17 Treated with ABX, completed Cipro course Denies current urinary sx's  Has appt with [facility] next week  Healthcare maintenance Continue all medications as directed, refill for Losartan 100mg . Please check Blood Pressure, Heart Rate tonight and tomorrow am and call us with readings. Continue excellent water intake and follow heart healthy diet. Referral to Gi placed. Please schedule complete physical with fasting labs in 3 months.    FOLLOW-UP:  Return in about 3 months (around 06/20/2017) for CPE, Fasting Labs.

## 2017-03-22 DIAGNOSIS — G20 Parkinson's disease: Secondary | ICD-10-CM | POA: Diagnosis not present

## 2017-03-22 DIAGNOSIS — M1990 Unspecified osteoarthritis, unspecified site: Secondary | ICD-10-CM | POA: Diagnosis not present

## 2017-03-22 DIAGNOSIS — B961 Klebsiella pneumoniae [K. pneumoniae] as the cause of diseases classified elsewhere: Secondary | ICD-10-CM | POA: Diagnosis not present

## 2017-03-22 DIAGNOSIS — G4733 Obstructive sleep apnea (adult) (pediatric): Secondary | ICD-10-CM | POA: Diagnosis not present

## 2017-03-22 DIAGNOSIS — I10 Essential (primary) hypertension: Secondary | ICD-10-CM | POA: Diagnosis not present

## 2017-03-22 DIAGNOSIS — C679 Malignant neoplasm of bladder, unspecified: Secondary | ICD-10-CM | POA: Diagnosis not present

## 2017-03-22 DIAGNOSIS — N390 Urinary tract infection, site not specified: Secondary | ICD-10-CM | POA: Diagnosis not present

## 2017-03-22 DIAGNOSIS — E785 Hyperlipidemia, unspecified: Secondary | ICD-10-CM | POA: Diagnosis not present

## 2017-03-22 DIAGNOSIS — K219 Gastro-esophageal reflux disease without esophagitis: Secondary | ICD-10-CM | POA: Diagnosis not present

## 2017-03-22 DIAGNOSIS — R634 Abnormal weight loss: Secondary | ICD-10-CM | POA: Diagnosis not present

## 2017-03-23 ENCOUNTER — Telehealth: Payer: Self-pay

## 2017-03-23 ENCOUNTER — Ambulatory Visit (INDEPENDENT_AMBULATORY_CARE_PROVIDER_SITE_OTHER): Payer: Medicare Other | Admitting: Adult Health

## 2017-03-23 ENCOUNTER — Encounter: Payer: Self-pay | Admitting: Adult Health

## 2017-03-23 VITALS — BP 90/76 | HR 78 | Resp 16 | Ht 62.99 in | Wt 140.0 lb

## 2017-03-23 DIAGNOSIS — G20 Parkinson's disease: Secondary | ICD-10-CM

## 2017-03-23 DIAGNOSIS — Z0000 Encounter for general adult medical examination without abnormal findings: Secondary | ICD-10-CM | POA: Insufficient documentation

## 2017-03-23 DIAGNOSIS — N3000 Acute cystitis without hematuria: Secondary | ICD-10-CM | POA: Diagnosis not present

## 2017-03-23 DIAGNOSIS — Z8551 Personal history of malignant neoplasm of bladder: Secondary | ICD-10-CM

## 2017-03-23 DIAGNOSIS — I10 Essential (primary) hypertension: Secondary | ICD-10-CM | POA: Diagnosis not present

## 2017-03-23 DIAGNOSIS — Z86010 Personal history of colonic polyps: Secondary | ICD-10-CM

## 2017-03-23 DIAGNOSIS — C670 Malignant neoplasm of trigone of bladder: Secondary | ICD-10-CM

## 2017-03-23 MED ORDER — LOSARTAN POTASSIUM 100 MG PO TABS: 100.0000 mg | ORAL_TABLET | Freq: Every day | ORAL | 2 refills | Status: DC

## 2017-03-23 NOTE — Assessment & Plan Note (Signed)
Treated with TURBT Followed by [facility]- records requested  Has OV next week

## 2017-03-23 NOTE — Assessment & Plan Note (Addendum)
BP low in clinic- 90/76, HR  She denies CP/dyspnea/palpitations/dizziness She has been taking Losartan 100mg  QD for years, denies SE Instructed to check BP/HR tonight and tomorrow am and call clinic with readings. Red Flag ex's discussed and when to seek immediate medica care.

## 2017-03-23 NOTE — Telephone Encounter (Signed)
Please authorize.

## 2017-03-23 NOTE — Assessment & Plan Note (Signed)
Continue all medications as directed, refill for Losartan 100mg . Please check Blood Pressure, Heart Rate tonight and tomorrow am and call us with readings. Continue excellent water intake and follow heart healthy diet. Referral to Gi placed. Please schedule complete physical with fasting labs in 3 months.

## 2017-03-23 NOTE — Assessment & Plan Note (Signed)
Dx'd 2017 Followed by Neurology, taking Rotigotine 6mg /24hr transdermal patch Reports difficulty with ambulation even with cane

## 2017-03-23 NOTE — Telephone Encounter (Signed)
Please advise if these are authorized.  Charyl Bigger, CMA

## 2017-03-23 NOTE — Telephone Encounter (Signed)
Herbert Deaner from [facility] called requesting that we call him back after the patient has he new patient appointment today.  He is needing verbal orders for the following: PT 2x a week for 4 weeks for functional mobility; eval for nursing for medication management; occupational therapy; speech therapy for cognitive and word formation; and medical social worker for setting up advance directives. His contact number is [phone].  MPulliam, CMA/RT(R)

## 2017-03-23 NOTE — Assessment & Plan Note (Signed)
Hospitalized 03/13/17 Treated with ABX, completed Cipro course Denies current urinary sx's  Has appt with [facility] next week

## 2017-03-23 NOTE — Patient Instructions (Addendum)
Heart-Healthy Eating Plan Many factors influence your heart health, including eating and exercise habits. Heart (coronary) risk increases with abnormal blood fat (lipid) levels. Heart-healthy meal planning includes limiting unhealthy fats, increasing healthy fats, and making other small dietary changes. This includes maintaining a healthy body weight to help keep lipid levels within a normal range. What is my plan? Your health care provider recommends that you:  Get no more than _25___% of the total calories in your daily diet from fat.  Limit your intake of saturated fat to less than __5___% of your total calories each day.  Limit the amount of cholesterol in your diet to less than __300__ mg per day.  What types of fat should I choose?  Choose healthy fats more often. Choose monounsaturated and polyunsaturated fats, such as olive oil and canola oil, flaxseeds, walnuts, almonds, and seeds.  Eat more omega-3 fats. Good choices include salmon, mackerel, sardines, tuna, flaxseed oil, and ground flaxseeds. Aim to eat fish at least two times each week.  Limit saturated fats. Saturated fats are primarily found in animal products, such as meats, butter, and cream. Plant sources of saturated fats include palm oil, palm kernel oil, and coconut oil.  Avoid foods with partially hydrogenated oils in them. These contain trans fats. Examples of foods that contain trans fats are stick margarine, some tub margarines, cookies, crackers, and other baked goods. What general guidelines do I need to follow?  Check food labels carefully to identify foods with trans fats or high amounts of saturated fat.  Fill one half of your plate with vegetables and green salads. Eat 4-5 servings of vegetables per day. A serving of vegetables equals 1 cup of raw leafy vegetables,  cup of raw or cooked cut-up vegetables, or  cup of vegetable juice.  Fill one fourth of your plate with whole grains. Look for the word "whole"  as the first word in the ingredient list.  Fill one fourth of your plate with lean protein foods.  Eat 4-5 servings of fruit per day. A serving of fruit equals one medium whole fruit,  cup of dried fruit,  cup of fresh, frozen, or canned fruit, or  cup of 100% fruit juice.  Eat more foods that contain soluble fiber. Examples of foods that contain this type of fiber are apples, broccoli, carrots, beans, peas, and barley. Aim to get 20-30 g of fiber per day.  Eat more home-cooked food and less restaurant, buffet, and fast food.  Limit or avoid alcohol.  Limit foods that are high in starch and sugar.  Avoid fried foods.  Cook foods by using methods other than frying. Baking, boiling, grilling, and broiling are all great options. Other fat-reducing suggestions include: ? Removing the skin from poultry. ? Removing all visible fats from meats. ? Skimming the fat off of stews, soups, and gravies before serving them. ? Steaming vegetables in water or broth.  Lose weight if you are overweight. Losing just 5-10% of your initial body weight can help your overall health and prevent diseases such as diabetes and heart disease.  Increase your consumption of nuts, legumes, and seeds to 4-5 servings per week. One serving of dried beans or legumes equals  cup after being cooked, one serving of nuts equals 1 ounces, and one serving of seeds equals  ounce or 1 tablespoon.  You may need to monitor your salt (sodium) intake, especially if you have high blood pressure. Talk with your health care provider or dietitian to get  more information about reducing sodium. What foods can I eat? Grains  Breads, including [place], white, pita, wheat, raisin, rye, oatmeal, and [place]. Tortillas that are neither fried nor made with lard or trans fat. Low-fat rolls, including hotdog and hamburger buns and English muffins. Biscuits. Muffins. Waffles. Pancakes. Light popcorn. Whole-grain cereals. Flatbread. Melba  toast. Pretzels. Breadsticks. Rusks. Low-fat snacks and crackers, including oyster, saltine, matzo, graham, animal, and rye. Rice and pasta, including brown rice and those that are made with whole wheat. Vegetables All vegetables. Fruits All fruits, but limit coconut. Meats and Other Protein Sources Lean, well-trimmed beef, veal, pork, and lamb. Chicken and [place] without skin. All fish and shellfish. Wild duck, rabbit, pheasant, and venison. Egg whites or low-cholesterol egg substitutes. Dried beans, peas, lentils, and tofu.Seeds and most nuts. Dairy Low-fat or nonfat cheeses, including ricotta, string, and mozzarella. Skim or 1% milk that is liquid, powdered, or evaporated. Buttermilk that is made with low-fat milk. Nonfat or low-fat yogurt. Beverages Mineral water. Diet carbonated beverages. Sweets and Desserts Sherbets and fruit ices. Honey, jam, marmalade, jelly, and syrups. Meringues and gelatins. Pure sugar candy, such as hard candy, jelly beans, gumdrops, mints, marshmallows, and small amounts of dark chocolate. [employer] Eat all sweets and desserts in moderation. Fats and Oils Nonhydrogenated (trans-free) margarines. Vegetable oils, including soybean, sesame, sunflower, olive, peanut, safflower, corn, canola, and cottonseed. Salad dressings or mayonnaise that are made with a vegetable oil. Limit added fats and oils that you use for cooking, baking, salads, and as spreads. Other Cocoa powder. Coffee and tea. All seasonings and condiments. The items listed above may not be a complete list of recommended foods or beverages. Contact your dietitian for more options. What foods are not recommended? Grains Breads that are made with saturated or trans fats, oils, or whole milk. Croissants. Butter rolls. Cheese breads. Sweet rolls. Donuts. Buttered popcorn. Chow mein noodles. High-fat crackers, such as cheese or butter crackers. Meats and Other Protein Sources Fatty meats, such as  hotdogs, short ribs, sausage, spareribs, bacon, ribeye roast or steak, and mutton. High-fat deli meats, such as salami and bologna. Caviar. Domestic duck and goose. Organ meats, such as kidney, liver, sweetbreads, brains, gizzard, chitterlings, and heart. Dairy Cream, sour cream, cream cheese, and creamed cottage cheese. Whole milk cheeses, including blue (bleu), Monte[place] Jack, [place], [place], American, [place], Swiss, [place], [place], and [place]. Whole or 2% milk that is liquid, evaporated, or condensed. Whole buttermilk. Cream sauce or high-fat cheese sauce. Yogurt that is made from whole milk. Beverages Regular sodas and drinks with added sugar. Sweets and Desserts Frosting. Pudding. Cookies. Cakes other than angel food cake. Candy that has milk chocolate or white chocolate, hydrogenated fat, butter, coconut, or unknown ingredients. Buttered syrups. Full-fat ice cream or ice cream drinks. Fats and Oils Gravy that has suet, meat fat, or shortening. Cocoa butter, hydrogenated oils, palm oil, coconut oil, palm kernel oil. These can often be found in baked products, candy, fried foods, nondairy creamers, and whipped toppings. Solid fats and shortenings, including bacon fat, salt pork, lard, and butter. Nondairy cream substitutes, such as coffee creamers and sour cream substitutes. Salad dressings that are made of unknown oils, cheese, or sour cream. The items listed above may not be a complete list of foods and beverages to avoid. Contact your dietitian for more information. This information is not intended to replace advice given to you by your health care provider. Make sure you discuss any questions you have with your health care  provider. Document Released: 10/28/2007 Document Revised: 08/08/2015 Document Reviewed: 07/12/2013 Elsevier Interactive Patient Education  2018 [place] all medications as directed, refill for Losartan 100mg . Please check Blood Pressure, Heart Rate tonight  and tomorrow am and call us with readings. Continue excellent water intake and follow heart healthy diet. Referral to Gi placed. Please schedule complete physical with fasting labs in 3 months. WELCOME TO THE PRACTICE!

## 2017-03-24 ENCOUNTER — Ambulatory Visit (INDEPENDENT_AMBULATORY_CARE_PROVIDER_SITE_OTHER): Payer: Medicare Other | Admitting: Adult Health

## 2017-03-24 ENCOUNTER — Other Ambulatory Visit: Payer: Self-pay | Admitting: Adult Health

## 2017-03-24 ENCOUNTER — Encounter: Payer: Self-pay | Admitting: Adult Health

## 2017-03-24 VITALS — BP 86/58

## 2017-03-24 DIAGNOSIS — I959 Hypotension, unspecified: Secondary | ICD-10-CM | POA: Diagnosis not present

## 2017-03-24 DIAGNOSIS — I10 Essential (primary) hypertension: Secondary | ICD-10-CM

## 2017-03-24 NOTE — Telephone Encounter (Signed)
Left message advising of recommendations.  

## 2017-03-24 NOTE — Patient Instructions (Addendum)
Hypotension Hypotension, commonly called low blood pressure, is when the force of blood pumping through your arteries is too weak. Arteries are blood vessels that carry blood from the heart throughout the body. When blood pressure is too low, you may not get enough blood to your brain or to the rest of your organs. This can cause weakness, light-headedness, rapid heartbeat, and fainting. Depending on the cause and severity, hypotension may be harmless (benign) or cause serious problems (critical). What are the causes? Possible causes of hypotension include:  Blood loss.  Loss of body fluids (dehydration).  Heart problems.  Hormone (endocrine) problems.  Pregnancy.  Severe infection.  Lack of certain nutrients.  Severe allergic reactions (anaphylaxis).  Certain medicines, such as blood pressure medicine or medicines that make the body lose excess fluids (diuretics). Sometimes, hypotension can be caused by not taking medicine as directed, such as taking too much of a certain medicine.  What increases the risk? Certain factors can make you more likely to develop hypotension, including:  Age. Risk increases as you get older.  Conditions that affect the heart or the central nervous system.  Taking certain medicines, such as blood pressure medicine or diuretics.  Being pregnant.  What are the signs or symptoms? Symptoms of this condition may include:  Weakness.  Light-headedness.  Dizziness.  Blurred vision.  Fatigue.  Rapid heartbeat.  Fainting, in severe cases.  How is this diagnosed? This condition is diagnosed based on:  Your medical history.  Your symptoms.  Your blood pressure measurement. Your health care provider will check your blood pressure when you are: ? Lying down. ? Sitting. ? Standing.  A blood pressure reading is recorded as two numbers, such as "120 over 80" (or 120/80). The first ("top") number is called the systolic pressure. It is a  measure of the pressure in your arteries as your heart beats. The second ("bottom") number is called the diastolic pressure. It is a measure of the pressure in your arteries when your heart relaxes between beats. Blood pressure is measured in a unit called mm Hg. Healthy blood pressure for adults is 120/80. If your blood pressure is below 90/60, you may be diagnosed with hypotension. Other information or tests that may be used to diagnose hypotension include:  Your other vital signs, such as your heart rate and temperature.  Blood tests.  Tilt table test. For this test, you will be safely secured to a table that moves you from a lying position to an upright position. Your heart rhythm and blood pressure will be monitored during the test.  How is this treated? Treatment for this condition may include:  Changing your diet. This may involve eating more salt (sodium) or drinking more water.  Taking medicines to raise your blood pressure.  Changing the dosage of certain medicines you are taking that might be lowering your blood pressure.  Wearing compression stockings. These stockings help to prevent blood clots and reduce swelling in your legs.  In some cases, you may need to go to the hospital for:  Fluid replacement. This means you will receive fluids through an IV tube.  Blood replacement. This means you will receive donated blood through an IV tube (transfusion).  Treating an infection or heart problems, if this applies.  Monitoring. You may need to be monitored while medicines that you are taking wear off.  Follow these instructions at home: Eating and drinking   Drink enough fluid to keep your urine clear or pale yellow.  Eat a healthy diet and follow instructions from your health care provider about eating or drinking restrictions. A healthy diet includes: ? Fresh fruits and vegetables. ? Whole grains. ? Lean meats. ? Low-fat dairy products.  Eat extra salt only as  directed. Do not add extra salt to your diet unless your health care provider told you to do that.  Eat frequent, small meals.  Avoid standing up suddenly after eating. Medicines  Take over-the-counter and prescription medicines only as told by your health care provider. ? Follow instructions from your health care provider about changing the dosage of your current medicines, if this applies. ? Do not stop or adjust any of your medicines on your own. General instructions  Wear compression stockings as told by your health care provider.  Get up slowly from lying down or sitting positions. This gives your blood pressure a chance to adjust.  Avoid hot showers and excessive heat as directed by your health care provider.  Return to your normal activities as told by your health care provider. Ask your health care provider what activities are safe for you.  Do not use any products that contain nicotine or tobacco, such as cigarettes and e-cigarettes. If you need help quitting, ask your health care provider.  Keep all follow-up visits as told by your health care provider. This is important. Contact a health care provider if:  You vomit.  You have diarrhea.  You have a fever for more than 2-3 days.  You feel more thirsty than usual.  You feel weak and tired. Get help right away if:  You have chest pain.  You have a fast or irregular heartbeat.  You develop numbness in any part of your body.  You cannot move your arms or your legs.  You have trouble speaking.  You become sweaty or feel light-headed.  You faint.  You feel short of breath.  You have trouble staying awake.  You feel confused. This information is not intended to replace advice given to you by your health care provider. Make sure you discuss any questions you have with your health care provider. Document Released: 01/18/2005 Document Revised: 08/08/2015 Document Reviewed: 07/10/2015 Elsevier Interactive  Patient Education  2018 [place] Losartan  Check Blood pressure and heart rate twice daily- record Call us Monday with readings. Seek immediate medical care if the following develop- Chest pain Palpitations Extreme dizziness Sweating profusely Keep follow-up appt

## 2017-03-24 NOTE — Assessment & Plan Note (Signed)
She denies any cardiac sx's She denies dizziness or increased fatigue HOLD Losartan  Check Blood pressure and heart rate twice daily- record Call us Monday with readings. Seek immediate medical care if the following develop- Chest pain Palpitations Extreme dizziness Sweating profusely Keep follow-up appt

## 2017-03-24 NOTE — Telephone Encounter (Signed)
Home Health advised.  

## 2017-03-24 NOTE — Progress Notes (Addendum)
Subjective:    Patient ID: Tara Ingram, female    DOB: July 20, 1945, 72 y.o.   MRN: [id number]  HPI:  Tara Ingram is here to establish as a new pt.  She is a pleasant 72 yr old female.   PMH: HTN, OSA, GERD, HL., Bladder Ca (TURBT 10/17/15) Parkinson's Disease, and recent hospitalization for acute cystitis w/o hematuria, as well as, dehydration/hypokalemia,and renal failure  She completed Cirpo 500mg  BID x 5 days Referral to GI/Dr. Rande Brunt for colonoscopy She is followed by [facility], has OV next week She reports slowly improving appetite since hospitalization. She had 20-30 unintentional lb weight prior to hospitalization. Se is followed by Neuro for Parkinson's, treated with rotigotine 6mg /24hr transdermal patch. She reports weakness and instability with recent hx of falls. Husband at [facility] during [place]  03/24/17: Tara Ingram was unable to obtain BP reading with home machine, therefore she was asked to present to clinic for evaluation. She denies CP/increased dyspnea/palpitatoins/dizziness.  She reports "I feel pretty darn great". She did loss sig amount of wt due to acute illness the last few months. She has never been evaluated by cardiology Husband at [facility] during [place]  Patient Care Team    Relationship Specialty Notifications Start End  Mina Marble D, NP PCP - General Family Medicine  02/21/17   Bjorn Pippin, PA-C Physician Assistant Emergency Medicine  03/23/17   Hennie Duos, MD Consulting Physician Rheumatology  03/23/17   Melvenia Beam, MD Consulting Physician Neurology  03/23/17   Rachelle Hora, MD Attending Physician Gastroenterology  03/23/17   Druscilla Brownie, MD Consulting Physician Dermatology  03/23/17   Margaretha Sheffield, MD Referring Physician Physical Medicine and Rehabilitation  03/23/17   Ardis Hughs, MD Attending Physician Urology  03/23/17   Marilynne Halsted, MD Referring Physician Ophthalmology  03/23/17   Guido Sander, PT  Physical Therapist Physical Therapy  03/23/17     Patient Active Problem List   Diagnosis Date Noted  . Healthcare maintenance 03/23/2017  . Hx of colonic polyps 03/23/2017  . Essential hypertension 03/23/2017  . UTI (urinary tract infection) 03/13/2017  . History of bladder cancer 03/13/2017  . Unintentional weight loss 03/13/2017  . Parkinson's disease ([place]) 11/16/2016  . Gait abnormality 10/06/2016  . Resting tremor 10/06/2016  . Malignant tumor of trigone of bladder ([place]) 09/18/2015  . OSA (obstructive sleep apnea) 12/30/2014  . Closed wedge compression fracture of second lumbar vertebra ([place])   . Mechanical complication due to intraocular lens implant 11/18/2010  . Nuclear cataract 11/18/2010  . DIARRHEA 04/17/2010     Past Medical History:  Diagnosis Date  . Allergy   . Arthritis    hands/feet/back ra  . Bladder cancer ([place])   . Cataract    bilateral  . GERD (gastroesophageal reflux disease)   . Headache   . History of kidney stones 50 yrs ago  . Hyperlipidemia   . Hypertension   . Sleep apnea    uses CPAP every night setting of 12     Past Surgical History:  Procedure Laterality Date  . APPENDECTOMY    . BACK SURGERY  05/2014   lower back for crushed disc  . CHOLECYSTECTOMY    . COLONOSCOPY  12/2011   hx polyps/jacobs  . CORNEAL TRANSPLANT     bilateral  . CYSTOSCOPY W/ RETROGRADES Bilateral 09/18/2015   Procedure: BILATERAL RETROGRADE PYELOGRAM;  Surgeon: Ardis Hughs, MD;  Location: [facility];  Service: Urology;  Laterality: Bilateral;  .  CYSTOSCOPY W/ RETROGRADES Right 10/17/2015   Procedure: CYSTOSCOPY WITH RETROGRADE PYELOGRAM;  Surgeon: Ardis Hughs, MD;  Location: [facility];  Service: Urology;  Laterality: Right;  . EYE SURGERY Bilateral    lens replacement for cataract  . FOOT SURGERY Left 6 yrs ago   tammer toe reconstruction  . PARTIAL HYSTERECTOMY    . TONSILLECTOMY    . TRANSURETHRAL RESECTION OF BLADDER TUMOR N/A 09/18/2015    Procedure: TRANSURETHRAL RESECTION OF BLADDER TUMOR (TURBT);  Surgeon: Ardis Hughs, MD;  Location: [facility];  Service: Urology;  Laterality: N/A;  . TRANSURETHRAL RESECTION OF BLADDER TUMOR N/A 10/17/2015   Procedure: TRANSURETHRAL RESECTION OF BLADDER TUMOR (TURBT);  Surgeon: Ardis Hughs, MD;  Location: [facility];  Service: Urology;  Laterality: N/A;     Family History  Problem Relation Age of Onset  . Colon cancer Sister 29  . Allergies Mother   . Heart disease Mother   . Rheumatologic disease Mother   . Heart attack Mother   . Allergies Father   . Asthma Father   . Heart disease Father   . Heart attack Father   . Esophageal cancer Neg Hx   . Colon polyps Neg Hx   . Rectal cancer Neg Hx   . Stomach cancer Neg Hx      Social History   Substance and Sexual Activity  Drug Use No     Social History   Substance and Sexual Activity  Alcohol Use No  . Alcohol/week: 0.0 oz     Social History   Tobacco Use  Smoking Status Never Smoker  Smokeless Tobacco Never Used     Outpatient Encounter Medications as of 03/24/2017  Medication Sig  . denosumab (PROLIA) 60 MG/ML SOLN injection Inject 60 mg into the skin every 6 (six) months. Administer in upper arm, thigh, or abdomen  . diclofenac sodium (VOLTAREN) 1 % GEL Apply 1 application topically 2 (two) times daily as needed (pain).   Marland Kitchen losartan (COZAAR) 100 MG tablet Take 1 tablet (100 mg total) by mouth daily.  . mirabegron ER (MYRBETRIQ) 50 MG TB24 tablet Take 50 mg by mouth daily.  Marland Kitchen oxyCODONE-acetaminophen (PERCOCET) 7.5-325 MG tablet Take 1 tablet by mouth every 6 (six) hours as needed for moderate pain or severe pain.   . rotigotine (NEUPRO) 6 MG/24HR Place 1 patch onto the skin daily.  Marland Kitchen tiZANidine (ZANAFLEX) 2 MG tablet Take 2 mg by mouth at bedtime as needed for muscle spasms.    No facility-administered encounter medications on file as of 03/24/2017.     Allergies: Patient has no known allergies.  There  is no height or weight on file to calculate BMI.  Blood pressure (!) 86/58.  HR 72   Review of Systems  Constitutional: Positive for activity change, appetite change and fatigue. Negative for chills, diaphoresis, fever and unexpected weight change.  Respiratory: Negative for cough, chest tightness, shortness of breath, wheezing and stridor.   Cardiovascular: Negative for chest pain, palpitations and leg swelling.  Gastrointestinal: Negative for abdominal distention, anal bleeding, blood in stool, constipation, diarrhea, nausea and vomiting.  Endocrine: Negative for cold intolerance, heat intolerance, polyphagia and polyuria.  Genitourinary: Negative for difficulty urinating, dysuria, flank pain, frequency, hematuria and urgency.  Musculoskeletal: Positive for arthralgias, back pain, gait problem, joint swelling, myalgias, neck pain and neck stiffness.  Skin: Positive for pallor. Negative for color change, rash and wound.  Neurological: Positive for tremors and weakness. Negative for dizziness, seizures, facial asymmetry  and speech difficulty.       Decreased memory  Hematological: Does not bruise/bleed easily.  Psychiatric/Behavioral: Negative for decreased concentration, hallucinations, self-injury, sleep disturbance and suicidal ideas. The patient is not nervous/anxious and is not hyperactive.        Objective:   Physical Exam  Constitutional: She is oriented to person, place, and time. She appears well-developed and well-nourished.  HENT:  Head: Normocephalic and atraumatic.  Left Ear: External ear normal.  Eyes: Conjunctivae are normal. Pupils are equal, round, and reactive to light.  Cardiovascular: Normal rate, regular rhythm, normal heart sounds and intact distal pulses.  No murmur heard. Pulmonary/Chest: Effort normal and breath sounds normal. No respiratory distress. She has no wheezes. She has no rales. She exhibits no tenderness.  Neurological: She is alert and oriented to  person, place, and time. She exhibits abnormal muscle tone. Coordination abnormal.  Very slow, shuffled gait  Skin: She is not diaphoretic. There is pallor.  Psychiatric: She has a normal mood and affect. Her behavior is normal. Judgment and thought content normal.          Assessment & Plan:   1. Hypotension, unspecified hypotension type     Hypotension She denies any cardiac sx's She denies dizziness or increased fatigue HOLD Losartan  Check Blood pressure and heart rate twice daily- record Call us Monday with readings. Seek immediate medical care if the following develop- Chest pain Palpitations Extreme dizziness Sweating profusely Keep follow-up appt    FOLLOW-UP:  Return if symptoms worsen or fail to improve.

## 2017-03-31 ENCOUNTER — Telehealth: Payer: Self-pay | Admitting: Adult Health

## 2017-03-31 DIAGNOSIS — M1990 Unspecified osteoarthritis, unspecified site: Secondary | ICD-10-CM | POA: Diagnosis not present

## 2017-03-31 DIAGNOSIS — C679 Malignant neoplasm of bladder, unspecified: Secondary | ICD-10-CM | POA: Diagnosis not present

## 2017-03-31 DIAGNOSIS — N390 Urinary tract infection, site not specified: Secondary | ICD-10-CM | POA: Diagnosis not present

## 2017-03-31 DIAGNOSIS — G20 Parkinson's disease: Secondary | ICD-10-CM | POA: Diagnosis not present

## 2017-03-31 DIAGNOSIS — R634 Abnormal weight loss: Secondary | ICD-10-CM | POA: Diagnosis not present

## 2017-03-31 DIAGNOSIS — B961 Klebsiella pneumoniae [K. pneumoniae] as the cause of diseases classified elsewhere: Secondary | ICD-10-CM | POA: Diagnosis not present

## 2017-03-31 NOTE — Telephone Encounter (Signed)
Sonia Baller with [facility] is requesting verbal orders for speech therapy, seeing the patient 1-2 for 4 weeks. Please advise and call her back at [phone]

## 2017-04-01 NOTE — Telephone Encounter (Signed)
LVM for Tara Ingram with [facility] authorizing speech therapy 1-2 per week/ x 4 weeks.  Charyl Bigger, CMA

## 2017-04-04 DIAGNOSIS — C672 Malignant neoplasm of lateral wall of bladder: Secondary | ICD-10-CM | POA: Diagnosis not present

## 2017-04-05 DIAGNOSIS — B961 Klebsiella pneumoniae [K. pneumoniae] as the cause of diseases classified elsewhere: Secondary | ICD-10-CM | POA: Diagnosis not present

## 2017-04-05 DIAGNOSIS — M1990 Unspecified osteoarthritis, unspecified site: Secondary | ICD-10-CM | POA: Diagnosis not present

## 2017-04-05 DIAGNOSIS — N390 Urinary tract infection, site not specified: Secondary | ICD-10-CM | POA: Diagnosis not present

## 2017-04-05 DIAGNOSIS — G20 Parkinson's disease: Secondary | ICD-10-CM | POA: Diagnosis not present

## 2017-04-05 DIAGNOSIS — C679 Malignant neoplasm of bladder, unspecified: Secondary | ICD-10-CM | POA: Diagnosis not present

## 2017-04-05 DIAGNOSIS — R634 Abnormal weight loss: Secondary | ICD-10-CM | POA: Diagnosis not present

## 2017-04-11 ENCOUNTER — Telehealth: Payer: Self-pay | Admitting: Adult Health

## 2017-04-11 NOTE — Telephone Encounter (Signed)
[facility] health nurse called to get verbal authorization to treat patient.  Please call her at [phone]

## 2017-04-11 NOTE — Telephone Encounter (Signed)
Cindy with [facility] request verbal authorization for social worker evaluation.  Authorized.  Charyl Bigger, CMA

## 2017-04-12 DIAGNOSIS — N390 Urinary tract infection, site not specified: Secondary | ICD-10-CM | POA: Diagnosis not present

## 2017-04-12 DIAGNOSIS — G20 Parkinson's disease: Secondary | ICD-10-CM | POA: Diagnosis not present

## 2017-04-12 DIAGNOSIS — B961 Klebsiella pneumoniae [K. pneumoniae] as the cause of diseases classified elsewhere: Secondary | ICD-10-CM | POA: Diagnosis not present

## 2017-04-12 DIAGNOSIS — M1990 Unspecified osteoarthritis, unspecified site: Secondary | ICD-10-CM | POA: Diagnosis not present

## 2017-04-12 DIAGNOSIS — R634 Abnormal weight loss: Secondary | ICD-10-CM | POA: Diagnosis not present

## 2017-04-12 DIAGNOSIS — C679 Malignant neoplasm of bladder, unspecified: Secondary | ICD-10-CM | POA: Diagnosis not present

## 2017-04-13 DIAGNOSIS — B961 Klebsiella pneumoniae [K. pneumoniae] as the cause of diseases classified elsewhere: Secondary | ICD-10-CM | POA: Diagnosis not present

## 2017-04-13 DIAGNOSIS — G20 Parkinson's disease: Secondary | ICD-10-CM | POA: Diagnosis not present

## 2017-04-13 DIAGNOSIS — N390 Urinary tract infection, site not specified: Secondary | ICD-10-CM | POA: Diagnosis not present

## 2017-04-13 DIAGNOSIS — M1990 Unspecified osteoarthritis, unspecified site: Secondary | ICD-10-CM | POA: Diagnosis not present

## 2017-04-13 DIAGNOSIS — C679 Malignant neoplasm of bladder, unspecified: Secondary | ICD-10-CM | POA: Diagnosis not present

## 2017-04-13 DIAGNOSIS — R634 Abnormal weight loss: Secondary | ICD-10-CM | POA: Diagnosis not present

## 2017-04-16 ENCOUNTER — Encounter (HOSPITAL_COMMUNITY): Payer: Self-pay

## 2017-04-16 ENCOUNTER — Emergency Department (HOSPITAL_COMMUNITY)
Admission: EM | Admit: 2017-04-16 | Discharge: 2017-04-16 | Disposition: A | Discharge status: Home / Self Care | Payer: Medicare Other | Attending: Emergency Medicine | Admitting: Emergency Medicine

## 2017-04-16 ENCOUNTER — Other Ambulatory Visit: Payer: Self-pay

## 2017-04-16 ENCOUNTER — Emergency Department (HOSPITAL_COMMUNITY): Payer: Medicare Other

## 2017-04-16 DIAGNOSIS — I10 Essential (primary) hypertension: Secondary | ICD-10-CM | POA: Diagnosis not present

## 2017-04-16 DIAGNOSIS — W1830XA Fall on same level, unspecified, initial encounter: Secondary | ICD-10-CM | POA: Diagnosis not present

## 2017-04-16 DIAGNOSIS — M546 Pain in thoracic spine: Secondary | ICD-10-CM | POA: Diagnosis not present

## 2017-04-16 DIAGNOSIS — Y999 Unspecified external cause status: Secondary | ICD-10-CM | POA: Insufficient documentation

## 2017-04-16 DIAGNOSIS — N390 Urinary tract infection, site not specified: Secondary | ICD-10-CM | POA: Diagnosis not present

## 2017-04-16 DIAGNOSIS — S3210XA Unspecified fracture of sacrum, initial encounter for closed fracture: Secondary | ICD-10-CM | POA: Diagnosis not present

## 2017-04-16 DIAGNOSIS — I493 Ventricular premature depolarization: Secondary | ICD-10-CM | POA: Diagnosis not present

## 2017-04-16 DIAGNOSIS — G20 Parkinson's disease: Secondary | ICD-10-CM | POA: Insufficient documentation

## 2017-04-16 DIAGNOSIS — M545 Low back pain: Secondary | ICD-10-CM | POA: Diagnosis present

## 2017-04-16 DIAGNOSIS — R4781 Slurred speech: Secondary | ICD-10-CM | POA: Diagnosis not present

## 2017-04-16 DIAGNOSIS — Y939 Activity, unspecified: Secondary | ICD-10-CM | POA: Diagnosis not present

## 2017-04-16 DIAGNOSIS — R531 Weakness: Secondary | ICD-10-CM | POA: Diagnosis not present

## 2017-04-16 DIAGNOSIS — Z8551 Personal history of malignant neoplasm of bladder: Secondary | ICD-10-CM | POA: Diagnosis not present

## 2017-04-16 DIAGNOSIS — Y92013 Bedroom of single-family (private) house as the place of occurrence of the external cause: Secondary | ICD-10-CM | POA: Diagnosis not present

## 2017-04-16 DIAGNOSIS — S3993XA Unspecified injury of pelvis, initial encounter: Secondary | ICD-10-CM | POA: Diagnosis not present

## 2017-04-16 DIAGNOSIS — Z79899 Other long term (current) drug therapy: Secondary | ICD-10-CM | POA: Insufficient documentation

## 2017-04-16 DIAGNOSIS — T148XXA Other injury of unspecified body region, initial encounter: Secondary | ICD-10-CM | POA: Diagnosis not present

## 2017-04-16 DIAGNOSIS — M533 Sacrococcygeal disorders, not elsewhere classified: Secondary | ICD-10-CM | POA: Diagnosis not present

## 2017-04-16 DIAGNOSIS — S3992XA Unspecified injury of lower back, initial encounter: Secondary | ICD-10-CM | POA: Diagnosis not present

## 2017-04-16 LAB — CBC
HEMATOCRIT: 36 % (ref 36.0–46.0)
Hemoglobin: 11.3 g/dL — ABNORMAL LOW (ref 12.0–15.0)
MCH: 29.7 pg (ref 26.0–34.0)
MCHC: 31.4 g/dL (ref 30.0–36.0)
MCV: 94.5 fL (ref 78.0–100.0)
Platelets: 274 10*3/uL (ref 150–400)
RBC: 3.81 MIL/uL — ABNORMAL LOW (ref 3.87–5.11)
RDW: 15.6 % — AB (ref 11.5–15.5)
WBC: 11 10*3/uL — AB (ref 4.0–10.5)

## 2017-04-16 LAB — URINALYSIS, ROUTINE W REFLEX MICROSCOPIC
Bilirubin Urine: NEGATIVE
GLUCOSE, UA: NEGATIVE mg/dL
KETONES UR: NEGATIVE mg/dL
Nitrite: POSITIVE — AB
PH: 6 (ref 5.0–8.0)
Specific Gravity, Urine: 1.01 (ref 1.005–1.030)

## 2017-04-16 LAB — BASIC METABOLIC PANEL
Anion gap: 9 (ref 5–15)
BUN: 13 mg/dL (ref 6–20)
CALCIUM: 8.5 mg/dL — AB (ref 8.9–10.3)
CO2: 24 mmol/L (ref 22–32)
Chloride: 108 mmol/L (ref 101–111)
Creatinine, Ser: 1.31 mg/dL — ABNORMAL HIGH (ref 0.44–1.00)
GFR calc Af Amer: 46 mL/min — ABNORMAL LOW (ref >60)
GFR calc non Af Amer: 39 mL/min — ABNORMAL LOW (ref >60)
GLUCOSE: 93 mg/dL (ref 65–99)
Potassium: 3.2 mmol/L — ABNORMAL LOW (ref 3.5–5.1)
Sodium: 141 mmol/L (ref 135–145)

## 2017-04-16 LAB — URINALYSIS, MICROSCOPIC (REFLEX)

## 2017-04-16 MED ORDER — SODIUM CHLORIDE 0.9 % IV BOLUS (SEPSIS): 1000.0000 mL | Freq: Once | INTRAVENOUS | Status: DC

## 2017-04-16 MED ORDER — CIPROFLOXACIN IN D5W 400 MG/200ML IV SOLN: 400.0000 mg | Freq: Once | INTRAVENOUS | Status: DC

## 2017-04-16 MED ORDER — HYDROCODONE-ACETAMINOPHEN 5-325 MG PO TABS
1.0000 | ORAL_TABLET | Freq: Once | ORAL | Status: AC
  Administered 2017-04-16: 1 via ORAL
  Filled 2017-04-16: qty 1

## 2017-04-16 MED ORDER — OXYCODONE-ACETAMINOPHEN 5-325 MG PO TABS: 1.0000 | ORAL_TABLET | Freq: Four times a day (QID) | ORAL | 0 refills | Status: AC | PRN

## 2017-04-16 MED ORDER — CIPROFLOXACIN HCL 500 MG PO TABS: 500.0000 mg | ORAL_TABLET | Freq: Two times a day (BID) | ORAL | 0 refills | Status: DC

## 2017-04-16 MED ORDER — CIPROFLOXACIN HCL 500 MG PO TABS
500.0000 mg | ORAL_TABLET | Freq: Once | ORAL | Status: AC
  Administered 2017-04-16: 500 mg via ORAL
  Filled 2017-04-16: qty 1

## 2017-04-16 MED ORDER — OXYCODONE-ACETAMINOPHEN 5-325 MG PO TABS
1.0000 | ORAL_TABLET | Freq: Once | ORAL | Status: AC
  Administered 2017-04-16: 1 via ORAL
  Filled 2017-04-16: qty 1

## 2017-04-16 NOTE — ED Provider Notes (Signed)
[facility] DEPT Provider Note   CSN: [id number] Arrival date & time: 04/16/17  1122     History   Chief Complaint Chief Complaint  Patient presents with  . Fall  . Back Pain    HPI Tara Ingram is a 72 y.o. female.  HPI  Patient presents with complaint of a fall at her home today.  She states that she got out of bed and stumbled and fell backwards.  She reports pain in her tailbone after the fall.  She did not strike her head.  She denies any other areas of pain.  She has a history of Parkinson's and states she has been more weak over the past 2 days and is also noticed that her urine has become cloudy and she is urinating more frequently.  No fever or chills.  No vomiting or abdominal pain.  She was treated for UTI 1 month ago with Cipro and completed the full course.  There are no other associated systemic symptoms, there are no other alleviating or modifying factors.   Past Medical History:  Diagnosis Date  . Allergy   . Arthritis    hands/feet/back ra  . Bladder cancer ([place])   . Cataract    bilateral  . GERD (gastroesophageal reflux disease)   . Headache   . History of kidney stones 50 yrs ago  . Hyperlipidemia   . Hypertension   . Sleep apnea    uses CPAP every night setting of 12    Patient Active Problem List   Diagnosis Date Noted  . Hypotension 03/24/2017  . Healthcare maintenance 03/23/2017  . Hx of colonic polyps 03/23/2017  . Essential hypertension 03/23/2017  . UTI (urinary tract infection) 03/13/2017  . History of bladder cancer 03/13/2017  . Unintentional weight loss 03/13/2017  . Parkinson's disease ([place]) 11/16/2016  . Gait abnormality 10/06/2016  . Resting tremor 10/06/2016  . Malignant tumor of trigone of bladder ([place]) 09/18/2015  . OSA (obstructive sleep apnea) 12/30/2014  . Closed wedge compression fracture of second lumbar vertebra ([place])   . Mechanical complication due to intraocular lens implant  11/18/2010  . Nuclear cataract 11/18/2010  . DIARRHEA 04/17/2010    Past Surgical History:  Procedure Laterality Date  . APPENDECTOMY    . BACK SURGERY  05/2014   lower back for crushed disc  . CHOLECYSTECTOMY    . COLONOSCOPY  12/2011   hx polyps/jacobs  . CORNEAL TRANSPLANT     bilateral  . CYSTOSCOPY W/ RETROGRADES Bilateral 09/18/2015   Procedure: BILATERAL RETROGRADE PYELOGRAM;  Surgeon: Ardis Hughs, MD;  Location: [facility];  Service: Urology;  Laterality: Bilateral;  . CYSTOSCOPY W/ RETROGRADES Right 10/17/2015   Procedure: CYSTOSCOPY WITH RETROGRADE PYELOGRAM;  Surgeon: Ardis Hughs, MD;  Location: [facility];  Service: Urology;  Laterality: Right;  . EYE SURGERY Bilateral    lens replacement for cataract  . FOOT SURGERY Left 6 yrs ago   tammer toe reconstruction  . PARTIAL HYSTERECTOMY    . TONSILLECTOMY    . TRANSURETHRAL RESECTION OF BLADDER TUMOR N/A 09/18/2015   Procedure: TRANSURETHRAL RESECTION OF BLADDER TUMOR (TURBT);  Surgeon: Ardis Hughs, MD;  Location: [facility];  Service: Urology;  Laterality: N/A;  . TRANSURETHRAL RESECTION OF BLADDER TUMOR N/A 10/17/2015   Procedure: TRANSURETHRAL RESECTION OF BLADDER TUMOR (TURBT);  Surgeon: Ardis Hughs, MD;  Location: [facility];  Service: Urology;  Laterality: N/A;    OB History    No data  available       Home Medications    Prior to Admission medications   Medication Sig Start Date End Date Taking? Authorizing Provider  denosumab (PROLIA) 60 MG/ML SOLN injection Inject 60 mg into the skin every 6 (six) months. Administer in upper arm, thigh, or abdomen   Yes [provider]  diclofenac sodium (VOLTAREN) 1 % GEL Apply 1 application topically 2 (two) times daily as needed (pain).  01/15/15  Yes [provider]  mirabegron ER (MYRBETRIQ) 50 MG TB24 tablet Take 50 mg by mouth daily.   Yes [provider]  rotigotine (NEUPRO) 6 MG/24HR Place 1 patch onto the skin daily. 01/19/17  Yes  Melvenia Beam, MD  tiZANidine (ZANAFLEX) 2 MG tablet Take 2 mg by mouth at bedtime as needed for muscle spasms.  12/31/14  Yes [provider]  ciprofloxacin (CIPRO) 500 MG tablet Take 1 tablet (500 mg total) by mouth every 12 (twelve) hours. 04/16/17   Lowanda Cashaw, Forbes Cellar, MD  oxyCODONE-acetaminophen (PERCOCET/ROXICET) 5-325 MG tablet Take 1-2 tablets by mouth every 6 (six) hours as needed for severe pain. 04/16/17   Calise Dunckel, Forbes Cellar, MD    Family History Family History  Problem Relation Age of Onset  . Colon cancer Sister 58  . Allergies Mother   . Heart disease Mother   . Rheumatologic disease Mother   . Heart attack Mother   . Allergies Father   . Asthma Father   . Heart disease Father   . Heart attack Father   . Esophageal cancer Neg Hx   . Colon polyps Neg Hx   . Rectal cancer Neg Hx   . Stomach cancer Neg Hx     Social History Social History   Tobacco Use  . Smoking status: Never Smoker  . Smokeless tobacco: Never Used  Substance Use Topics  . Alcohol use: No    Alcohol/week: 0.0 oz  . Drug use: No     Allergies   Patient has no known allergies.   Review of Systems Review of Systems  ROS reviewed and all otherwise negative except for mentioned in HPI   Physical Exam Updated Vital Signs BP (!) 141/107   Pulse 88   Temp 98.1 F (36.7 C) (Oral)   Resp 18   Ht 5\' 3"  (1.6 m)   Wt 61.2 kg (135 lb)   SpO2 95%   BMI 23.91 kg/m  Vitals reviewed Physical Exam  Physical Examination: General appearance - alert, well appearing, and in no distress Mental status - alert, oriented to person, place, and time Eyes - no conjunctival injection, no scleral icterus Mouth - mucous membranes moist, pharynx normal without lesions Chest - clear to auscultation, no wheezes, rales or rhonchi, symmetric air entry Heart - normal rate, regular rhythm, normal S1, S2, no murmurs, rubs, clicks or gallops Abdomen - soft, nontender, nondistended, no masses or  organomegaly Neurological - alert, oriented x 3, normal speech Back- no ttp over cervical/thoracic/lumbar spine, ttp over lower sacrum and coccyx in midline, no CVA tenderness Extremities - peripheral pulses normal, no pedal edema, no clubbing or cyanosis, no pain to palpation or ROM of lower extremities Skin - normal coloration and turgor, no rashes   ED Treatments / Results  Labs (all labs ordered are listed, but only abnormal results are displayed) Labs Reviewed  CBC - Abnormal; Notable for the following components:      Result Value   WBC 11.0 (*)    RBC 3.81 (*)  Hemoglobin 11.3 (*)    RDW 15.6 (*)    All other components within normal limits  BASIC METABOLIC PANEL - Abnormal; Notable for the following components:   Potassium 3.2 (*)    Creatinine, Ser 1.31 (*)    Calcium 8.5 (*)    GFR calc non Af Amer 39 (*)    GFR calc Af Amer 46 (*)    All other components within normal limits  URINALYSIS, ROUTINE W REFLEX MICROSCOPIC - Abnormal; Notable for the following components:   APPearance HAZY (*)    Hgb urine dipstick MODERATE (*)    Protein, ur TRACE (*)    Nitrite POSITIVE (*)    Leukocytes, UA LARGE (*)    All other components within normal limits  URINALYSIS, MICROSCOPIC (REFLEX) - Abnormal; Notable for the following components:   Bacteria, UA MANY (*)    Squamous Epithelial / LPF 6-30 (*)    All other components within normal limits  URINE CULTURE    EKG  EKG Interpretation  Date/Time:  Saturday April 16 2017 13:17:13 EDT Ventricular Rate:  91 PR Interval:    QRS Duration: 88 QT Interval:  389 QTC Calculation: 449 R Axis:   63 Text Interpretation:  Sinus rhythm Multiform ventricular premature complexes Borderline low voltage, extremity leads No significant change since last tracing Confirmed by Alfonzo Beers [id number]) on 04/16/2017 1:32:42 PM       Radiology Dg Sacrum/coccyx  Result Date: 04/16/2017 CLINICAL DATA:  72 year old who fell in her bathroom  this morning onto her buttocks and complains of sacrococcygeal pain. Initial encounter. EXAM: SACRUM AND COCCYX - 2+ VIEW COMPARISON:  Bone window images from CT abdomen and pelvis 08/15/2015. FINDINGS: The prior CT demonstrated and acutely angled sacrococcygeal junction as is present on today's examination. However, a fracture involving the lower sacrum is suspected based on the appearance of the lateral view. Sacroiliac joints intact with mild degenerative changes. Symphysis pubis intact. IMPRESSION: Likely acute fracture involving the lower sacrum. The acute angulation at the sacrococcygeal junction is unchanged since 2017. Electronically Signed   By: Evangeline Dakin M.D.   On: 04/16/2017 13:47    Procedures Procedures (including critical care time)  Medications Ordered in ED Medications  oxyCODONE-acetaminophen (PERCOCET/ROXICET) 5-325 MG per tablet 1 tablet (not administered)  ciprofloxacin (CIPRO) tablet 500 mg (not administered)  HYDROcodone-acetaminophen (NORCO/VICODIN) 5-325 MG per tablet 1 tablet (1 tablet Oral Given 04/16/17 1345)     Initial Impression / Assessment and Plan / ED Course  I have reviewed the triage vital signs and the nursing notes.  Pertinent labs & imaging results that were available during my care of the patient were reviewed by me and considered in my medical decision making (see chart for details).    Patient presents after what sounds like a mechanical fall at home today, she complains of tailbone pain.  On x-ray she has a possible fracture of her sacrum.  She also has noticed dysuria and cloudy urine.  Her urinalysis is consistent with UTI.  Her most recent urine culture grew Klebsiella and was sensitive to ciprofloxacin so she will be placed on a 10-day course of Cipro.  Patient treated for her pain in the ED and given first dose of antibiotics.  She is comfortable with plan for discharge home.  Final Clinical Impressions(s) / ED Diagnoses   Final diagnoses:   Lower urinary tract infectious disease  Closed fracture of sacrum, unspecified fracture morphology, initial encounter [facility])    ED Discharge Orders  Ordered    ciprofloxacin (CIPRO) 500 MG tablet  Every 12 hours     04/16/17 1458    oxyCODONE-acetaminophen (PERCOCET/ROXICET) 5-325 MG tablet  Every 6 hours PRN     04/16/17 1458       Demetrica Zipp, Forbes Cellar, MD 04/16/17 [phone]

## 2017-04-16 NOTE — ED Triage Notes (Signed)
EMS reports from home called for witnessed fall out of bed, no LOC, no blood thinners, c/o lower back pain, Hx of parkinson's and c/o increased weakness x 2 days. Urinary frequency and was Dx with UTI x 4 weeks ago and treated.  BP 130/80 HR 80 Resp 16 Sp02 98 RA

## 2017-04-16 NOTE — Discharge Instructions (Signed)
Return to the ED with any concerns including fever/chill, vomiting and not able to keep down liquids or antibiotics, abdominal pain, weakness of legs, not able to urinate, loss of control of bowel or bladder, decreased level of alertness/lethargy, or any other alarming symptoms

## 2017-04-18 LAB — URINE CULTURE: Culture: >=100000 — AB

## 2017-04-19 ENCOUNTER — Telehealth: Payer: Self-pay | Admitting: Emergency Medicine

## 2017-04-19 DIAGNOSIS — C679 Malignant neoplasm of bladder, unspecified: Secondary | ICD-10-CM | POA: Diagnosis not present

## 2017-04-19 DIAGNOSIS — N390 Urinary tract infection, site not specified: Secondary | ICD-10-CM | POA: Diagnosis not present

## 2017-04-19 DIAGNOSIS — G20 Parkinson's disease: Secondary | ICD-10-CM | POA: Diagnosis not present

## 2017-04-19 DIAGNOSIS — R634 Abnormal weight loss: Secondary | ICD-10-CM | POA: Diagnosis not present

## 2017-04-19 DIAGNOSIS — B961 Klebsiella pneumoniae [K. pneumoniae] as the cause of diseases classified elsewhere: Secondary | ICD-10-CM | POA: Diagnosis not present

## 2017-04-19 DIAGNOSIS — M1990 Unspecified osteoarthritis, unspecified site: Secondary | ICD-10-CM | POA: Diagnosis not present

## 2017-04-19 NOTE — Progress Notes (Signed)
ED Antimicrobial Stewardship Positive Culture Follow Up   Tara Ingram is an 72 y.o. female who presented to [facility] on 04/16/2017 with a chief complaint of  Chief Complaint  Patient presents with  . Fall  . Back Pain    Recent Results (from the past 720 hour(s))  Urine Culture     Status: Abnormal   Collection Time: 04/16/17 12:38 PM  Result Value Ref Range Status   Specimen Description   Final    URINE, RANDOM Performed at [facility] [address] [place], [place] [address]    Special Requests   Final    NONE Performed at [facility], [place] [address], [place], [place] [address]    Culture >=100,000 COLONIES/mL ESCHERICHIA COLI (A)  Final   Report Status 04/18/2017 FINAL  Final   Organism ID, Bacteria ESCHERICHIA COLI (A)  Final      Susceptibility   Escherichia coli - MIC*    AMPICILLIN 4 SENSITIVE Sensitive     CEFAZOLIN <=4 SENSITIVE Sensitive     CEFTRIAXONE <=1 SENSITIVE Sensitive     CIPROFLOXACIN >=4 RESISTANT Resistant     GENTAMICIN <=1 SENSITIVE Sensitive     IMIPENEM <=0.25 SENSITIVE Sensitive     NITROFURANTOIN <=16 SENSITIVE Sensitive     TRIMETH/SULFA <=20 SENSITIVE Sensitive     AMPICILLIN/SULBACTAM 4 SENSITIVE Sensitive     PIP/TAZO <=4 SENSITIVE Sensitive     Extended ESBL NEGATIVE Sensitive     * >=100,000 COLONIES/mL ESCHERICHIA COLI    [x]  Treated with Cipro, organism resistant to prescribed antimicrobial   New antibiotic prescription: Keflex 500mg  PO BID x 5 days Stop Cipro  ED Provider: Benedetto Goad, Skip Mayer 04/19/2017, 8:52 AM Infectious Diseases Pharmacist Phone# [phone]

## 2017-04-19 NOTE — Telephone Encounter (Signed)
Post ED Visit - Positive Culture Follow-up: Successful Patient Follow-Up  Culture assessed and recommendations reviewed by: []  Elenor Quinones, Pharm.D. []  Heide Guile, Pharm.D., BCPS AQ-ID []  Parks Neptune, Pharm.D., BCPS []  Alycia Rossetti, Pharm.D., BCPS []  Pax, Pharm.D., BCPS, AAHIVP [x]  Legrand Como, Pharm.D., BCPS, AAHIVP []  Salome Arnt, PharmD, BCPS []  Dimitri Ped, PharmD, BCPS []  Vincenza Hews, PharmD, BCPS  Positive urine culture  []  Patient discharged without antimicrobial prescription and treatment is now indicated [x]  Organism is resistant to prescribed ED discharge antimicrobial []  Patient with positive blood cultures  Changes discussed with ED provider:Kelsey Marijean Bravo PA New antibiotic prescription d/c cipro, start Keflex 500mg  po bid x 5 days Called to [facility] [phone]  Contacted patient, 04/19/2017 1400   Hazle Nordmann 04/19/2017, 1:58 PM

## 2017-04-26 ENCOUNTER — Telehealth: Payer: Self-pay | Admitting: Adult Health

## 2017-04-26 ENCOUNTER — Other Ambulatory Visit: Payer: Self-pay | Admitting: Adult Health

## 2017-04-26 DIAGNOSIS — M1990 Unspecified osteoarthritis, unspecified site: Secondary | ICD-10-CM | POA: Diagnosis not present

## 2017-04-26 DIAGNOSIS — C679 Malignant neoplasm of bladder, unspecified: Secondary | ICD-10-CM | POA: Diagnosis not present

## 2017-04-26 DIAGNOSIS — B961 Klebsiella pneumoniae [K. pneumoniae] as the cause of diseases classified elsewhere: Secondary | ICD-10-CM | POA: Diagnosis not present

## 2017-04-26 DIAGNOSIS — M21619 Bunion of unspecified foot: Secondary | ICD-10-CM

## 2017-04-26 DIAGNOSIS — G20 Parkinson's disease: Secondary | ICD-10-CM | POA: Diagnosis not present

## 2017-04-26 DIAGNOSIS — N390 Urinary tract infection, site not specified: Secondary | ICD-10-CM | POA: Diagnosis not present

## 2017-04-26 DIAGNOSIS — R634 Abnormal weight loss: Secondary | ICD-10-CM | POA: Diagnosis not present

## 2017-04-26 NOTE — Progress Notes (Signed)
Subjective:    Patient ID: Tara Ingram, female    DOB: 11-28-1945, 72 y.o.   MRN: [id number]  HPI: 03/23/17 OV: Ms. Tara Ingram is here to establish as a new pt.  She is a pleasant 72 yr old female.   PMH: HTN, OSA, GERD, HL., Bladder Ca (TURBT 10/17/15) Parkinson's Disease, and recent hospitalization for acute cystitis w/o hematuria, as well as, dehydration/hypokalemia,and renal failure  She completed Cirpo 500mg  BID x 5 days Referral to GI/Dr. Rande Brunt for colonoscopy She is followed by [facility], has OV next week She reports slowly improving appetite since hospitalization. She had 20-30 unintentional lb weight prior to hospitalization. Se is followed by Neuro for Parkinson's, treated with rotigotine 6mg /24hr transdermal patch. She reports weakness and instability with recent hx of falls. Husband at [facility] during [place]  03/24/17 OV: Ms. Tara Ingram was unable to obtain BP reading with home machine, therefore she was asked to present to clinic for evaluation. She denies CP/increased dyspnea/palpitatoins/dizziness.  She reports "I feel pretty darn great". She did loss sig amount of wt due to acute illness the last few months. She has never been evaluated by cardiology Husband at [facility] during [place] given to pt and her husband- HOLD Losartan  Check Blood pressure and heart rate twice daily- record Discussed [place] sx's and when to seek immediate medical care Call us Monday with readings- which would have been 03/28/17 Pt and her husband verbalized understanding.  04/27/17 OV: Pt never followed up with [place] on 03/28/17, re: hypotension She suffered fall 04/16/17- treated at local ED for UTI and poss fx of sacrum.  She was pain rx and 10 day course of Cipro, she has been taking inconsistently and reports having a few days remaining on course.  She continues to have urinary frequency, she denies dysuria or hematuria. Home health RN called yesterday with several issues- elevated BP,  lower ext edema, bunion, low temp She has not been on losartan 100mg  since 03/24/17  04/16/17- BMP: ser creat 1.31, GFR 39 Estimated Creatinine Clearance: 34.5 mL/min (A) (by C-G formula based on SCr of 1.31 mg/dL (H)).  She has appt with Urologist tomorrow- they can address urinary sx's and abx therapy.  Referral to Podiatrist has been placed to address bunion.  She reports being on diuretic in past and stopped it "b/c it made me pee all the time".  She denies ever being told about she has CKD.  She does recall being told that her K+ is on lower side.  Temp today 97.9  Bilateral lower ext edema 1+. She denies CP/dyspnea/palpitations.  She has only gained 2+ since last OV 03/23/17 She denies hx of CHF  Husband at [facility] during [place]  Patient Care Team    Relationship Specialty Notifications Start End  Mina Marble D, NP PCP - General Family Medicine  02/21/17   Bjorn Pippin, PA-C Physician Assistant Emergency Medicine  03/23/17   Hennie Duos, MD Consulting Physician Rheumatology  03/23/17   Melvenia Beam, MD Consulting Physician Neurology  03/23/17   Rachelle Hora, MD Attending Physician Gastroenterology  03/23/17   Druscilla Brownie, MD Consulting Physician Dermatology  03/23/17   Margaretha Sheffield, MD Referring Physician Physical Medicine and Rehabilitation  03/23/17   Ardis Hughs, MD Attending Physician Urology  03/23/17   Marilynne Halsted, MD Referring Physician Ophthalmology  03/23/17   Guido Sander, PT Physical Therapist Physical Therapy  03/23/17     Patient Active Problem List  Diagnosis Date Noted  . CKD (chronic kidney disease), stage III ([place]) 04/27/2017  . Hypokalemia 04/27/2017  . Bunion 04/27/2017  . Hypotension 03/24/2017  . Healthcare maintenance 03/23/2017  . Hx of colonic polyps 03/23/2017  . Essential hypertension 03/23/2017  . UTI (urinary tract infection) 03/13/2017  . History of bladder cancer 03/13/2017  . Unintentional weight loss  03/13/2017  . Parkinson's disease ([place]) 11/16/2016  . Gait abnormality 10/06/2016  . Resting tremor 10/06/2016  . Malignant tumor of trigone of bladder ([place]) 09/18/2015  . OSA (obstructive sleep apnea) 12/30/2014  . Closed wedge compression fracture of second lumbar vertebra ([place])   . Mechanical complication due to intraocular lens implant 11/18/2010  . Nuclear cataract 11/18/2010  . DIARRHEA 04/17/2010     Past Medical History:  Diagnosis Date  . Allergy   . Arthritis    hands/feet/back ra  . Bladder cancer ([place])   . Cataract    bilateral  . GERD (gastroesophageal reflux disease)   . Headache   . History of kidney stones 50 yrs ago  . Hyperlipidemia   . Hypertension   . Sleep apnea    uses CPAP every night setting of 12     Past Surgical History:  Procedure Laterality Date  . APPENDECTOMY    . BACK SURGERY  05/2014   lower back for crushed disc  . CHOLECYSTECTOMY    . COLONOSCOPY  12/2011   hx polyps/jacobs  . CORNEAL TRANSPLANT     bilateral  . CYSTOSCOPY W/ RETROGRADES Bilateral 09/18/2015   Procedure: BILATERAL RETROGRADE PYELOGRAM;  Surgeon: Ardis Hughs, MD;  Location: [facility];  Service: Urology;  Laterality: Bilateral;  . CYSTOSCOPY W/ RETROGRADES Right 10/17/2015   Procedure: CYSTOSCOPY WITH RETROGRADE PYELOGRAM;  Surgeon: Ardis Hughs, MD;  Location: [facility];  Service: Urology;  Laterality: Right;  . EYE SURGERY Bilateral    lens replacement for cataract  . FOOT SURGERY Left 6 yrs ago   tammer toe reconstruction  . PARTIAL HYSTERECTOMY    . TONSILLECTOMY    . TRANSURETHRAL RESECTION OF BLADDER TUMOR N/A 09/18/2015   Procedure: TRANSURETHRAL RESECTION OF BLADDER TUMOR (TURBT);  Surgeon: Ardis Hughs, MD;  Location: [facility];  Service: Urology;  Laterality: N/A;  . TRANSURETHRAL RESECTION OF BLADDER TUMOR N/A 10/17/2015   Procedure: TRANSURETHRAL RESECTION OF BLADDER TUMOR (TURBT);  Surgeon: Ardis Hughs, MD;  Location: [facility];  Service:  Urology;  Laterality: N/A;     Family History  Problem Relation Age of Onset  . Colon cancer Sister 24  . Allergies Mother   . Heart disease Mother   . Rheumatologic disease Mother   . Heart attack Mother   . Allergies Father   . Asthma Father   . Heart disease Father   . Heart attack Father   . Esophageal cancer Neg Hx   . Colon polyps Neg Hx   . Rectal cancer Neg Hx   . Stomach cancer Neg Hx      Social History   Substance and Sexual Activity  Drug Use No     Social History   Substance and Sexual Activity  Alcohol Use No  . Alcohol/week: 0.0 oz     Social History   Tobacco Use  Smoking Status Never Smoker  Smokeless Tobacco Never Used     Outpatient Encounter Medications as of 04/27/2017  Medication Sig  . ciprofloxacin (CIPRO) 500 MG tablet Take 1 tablet (500 mg total) by mouth every 12 (twelve) hours.  Marland Kitchen  denosumab (PROLIA) 60 MG/ML SOLN injection Inject 60 mg into the skin every 6 (six) months. Administer in upper arm, thigh, or abdomen  . diclofenac sodium (VOLTAREN) 1 % GEL Apply 1 application topically 2 (two) times daily as needed (pain).   . mirabegron ER (MYRBETRIQ) 50 MG TB24 tablet Take 50 mg by mouth daily.  Marland Kitchen oxyCODONE-acetaminophen (PERCOCET/ROXICET) 5-325 MG tablet Take 1-2 tablets by mouth every 6 (six) hours as needed for severe pain.  . rotigotine (NEUPRO) 6 MG/24HR Place 1 patch onto the skin daily.  Marland Kitchen tiZANidine (ZANAFLEX) 2 MG tablet Take 2 mg by mouth at bedtime as needed for muscle spasms.   . hydrochlorothiazide (HYDRODIURIL) 25 MG tablet 1 tab daily Monday, Wednesday, Friday  . losartan (COZAAR) 50 MG tablet Take 1 tablet (50 mg total) by mouth daily.  . potassium chloride (K-DUR) 10 MEQ tablet Take 1 tablet (10 mEq total) by mouth daily.   No facility-administered encounter medications on file as of 04/27/2017.     Allergies: Patient has no known allergies.  Body mass index is 25.16 kg/m.  Blood pressure (!) 151/79, pulse 74,  temperature 97.9 F (36.6 C), height 5' 2.99" (1.6 m), weight 142 lb (64.4 kg), SpO2 100 %.  HR 72   Review of Systems  Constitutional: Positive for activity change, appetite change and fatigue. Negative for chills, diaphoresis, fever and unexpected weight change.  Respiratory: Negative for cough, chest tightness, shortness of breath, wheezing and stridor.   Cardiovascular: Positive for leg swelling. Negative for chest pain and palpitations.  Gastrointestinal: Negative for abdominal distention, anal bleeding, blood in stool, constipation, diarrhea, nausea and vomiting.  Endocrine: Negative for cold intolerance, heat intolerance, polyphagia and polyuria.  Genitourinary: Negative for difficulty urinating, dysuria, flank pain, frequency, hematuria and urgency.  Musculoskeletal: Positive for arthralgias, back pain, gait problem, joint swelling, myalgias, neck pain and neck stiffness.  Skin: Positive for pallor. Negative for color change, rash and wound.  Neurological: Positive for tremors and weakness. Negative for dizziness, seizures, facial asymmetry and speech difficulty.       Decreased memory  Hematological: Does not bruise/bleed easily.  Psychiatric/Behavioral: Negative for decreased concentration, hallucinations, self-injury, sleep disturbance and suicidal ideas. The patient is not nervous/anxious and is not hyperactive.        Objective:   Physical Exam  Constitutional: She is oriented to person, place, and time. She appears well-developed and well-nourished.  HENT:  Head: Normocephalic and atraumatic.  Left Ear: External ear normal.  Eyes: Pupils are equal, round, and reactive to light. Conjunctivae are normal.  Cardiovascular: Normal rate, regular rhythm, normal heart sounds and intact distal pulses.  No murmur heard. Pulmonary/Chest: Effort normal and breath sounds normal. No respiratory distress. She has no wheezes. She has no rales. She exhibits no tenderness.  Abdominal: Soft.  Bowel sounds are normal. She exhibits no distension and no mass. There is no tenderness. There is no rigidity, no rebound, no guarding and no CVA tenderness.  Musculoskeletal: She exhibits edema.       Right ankle: She exhibits swelling.       Left ankle: She exhibits swelling.       Right lower leg: She exhibits edema.       Left lower leg: She exhibits edema.       Right foot: There is swelling.       Left foot: There is swelling.  Bilateral lower ext edema 1+, with very mild ecchymosis.  Lower extremities are the same temp as  upper extremities upon palpation. No pitting or sings of infection  Neurological: She is alert and oriented to person, place, and time. She exhibits abnormal muscle tone. Coordination abnormal.  Very slow, shuffled gait  Skin: She is not diaphoretic. There is pallor.  Psychiatric: She has a normal mood and affect. Her behavior is normal. Judgment and thought content normal.          Assessment & Plan:   1. Healthcare maintenance   2. Essential hypertension   3. CKD (chronic kidney disease), stage III ([place])   4. Hypokalemia   5. Bunion     Essential hypertension BP 151/79, HR 74 Re-start Losartan 50mg  (reduced from 100mg ) once daily. Start daily Potassium Chloride 10 MeQ. Start HCTZ 25mg - take Monday/Wednesday/Friday   Healthcare maintenance Continue all current medications as directed. New medication changes- Re-start Losartan 50mg  (reduced from 100mg ) once daily. Start daily Potassium Chloride 10 MeQ. Start HCTZ 25mg - take Monday/Wednesday/Friday Please discuss current urinary symptoms and current antibiotic use with Urologist at your appt tomorrow. Please schedule a follow-up appt with [place] in 3 weeks, re: high blood pressure, chronic kidney disease, low potassium. We will also obtain come labs at your next follow up appt. Referral to Podiatrist has been placed to address bunion. Please keep your complete physical appt in May 2019.  CKD  (chronic kidney disease), stage III (H[facility]) Estimated Creatinine Clearance: 34.5 mL/min (A) (by C-G formula based on SCr of 1.31 mg/dL (H)).  GFR 39  Hypokalemia Potassium 3.2 drawn 04/16/17 K+ levels has chronically run low Started on HCTZ 25mg  M/WF Started on K-Dur 10 MeQ QD Will recheck CMP in 3 weeks at f/u  Bunion Podiatry referral placed 04/26/17  Pt was in the office today for 40+ minutes, with over 50% time spent in face to face counseling of various medical concerns and in coordination of care  FOLLOW-UP:  Return in about 3 weeks (around 05/18/2017) for Regular Follow Up, HTN, CMP.

## 2017-04-26 NOTE — Telephone Encounter (Signed)
Called and spoke to [place], she states that she went to see the patient today and her BP was 150/90 - patient informed her that she was on BP medication in the past but it had been discontinued. Patient is also c/o urinary frequency and incontinence. Patient has edema in both lower extremity and are hot to the touch. Apolonio Schneiders states that patient's temp. was 96.3 and it is normally 97-98.  Patient also needs referral for bunion, right great toe.  Apolonio Schneiders advised that the patient needs to make an appointment with [place].  I have given patient information to [facility] to call and make appointment. Please advise if there is anything that needs to be done before the appointment.  MPulliam, CMA/RT(R)

## 2017-04-26 NOTE — Telephone Encounter (Signed)
Merry Proud a PT with [facility] is requesting verbal orders to extend PT for patient to twice a week for four more weeks. He can be reached at [phone].

## 2017-04-26 NOTE — Telephone Encounter (Signed)
Apolonio Schneiders nurse with [facility] wants to speak with the nurse about changes in patient. She can be reached at [phone].

## 2017-04-26 NOTE — Telephone Encounter (Signed)
Afternoon Melissa,  1) She established with [place] 03/23/17, BP was low.  She returned following day and BP remained low, however pt denied dizziness/HA/palpitations/CP. Her Losartan 100mg  was held and [employer] sx's were discussed and when to seek immediate medical care. She was instructed to call clinic 03/28/17 with BP/HR readings- she never did.  2) We will check UA tomorrow  3) Will assess bil lower ext edema, recommend cards referral  4) Referral to Podiatry placed  5) She had UTI and fall 04/16/17- she was treated at local ED for UTI and pain control for poss fx'd sacrum  Again will address all of these issues tomorrow at [place]  Thanks! Valetta Fuller

## 2017-04-26 NOTE — Telephone Encounter (Signed)
Called and left message to call the office back. MPulliam, CMA/RT(R)  

## 2017-04-27 ENCOUNTER — Ambulatory Visit (INDEPENDENT_AMBULATORY_CARE_PROVIDER_SITE_OTHER): Payer: Medicare Other | Admitting: Adult Health

## 2017-04-27 ENCOUNTER — Encounter: Payer: Self-pay | Admitting: Adult Health

## 2017-04-27 ENCOUNTER — Other Ambulatory Visit: Payer: Self-pay | Admitting: Adult Health

## 2017-04-27 VITALS — BP 151/79 | HR 74 | Temp 97.9°F | Ht 62.99 in | Wt 142.0 lb

## 2017-04-27 DIAGNOSIS — I10 Essential (primary) hypertension: Secondary | ICD-10-CM

## 2017-04-27 DIAGNOSIS — M21619 Bunion of unspecified foot: Secondary | ICD-10-CM | POA: Diagnosis not present

## 2017-04-27 DIAGNOSIS — Z0000 Encounter for general adult medical examination without abnormal findings: Secondary | ICD-10-CM | POA: Diagnosis not present

## 2017-04-27 DIAGNOSIS — E876 Hypokalemia: Secondary | ICD-10-CM

## 2017-04-27 DIAGNOSIS — N183 Chronic kidney disease, stage 3 unspecified: Secondary | ICD-10-CM

## 2017-04-27 MED ORDER — POTASSIUM CHLORIDE ER 10 MEQ PO TBCR: 10.0000 meq | EXTENDED_RELEASE_TABLET | Freq: Every day | ORAL | 0 refills | Status: DC

## 2017-04-27 MED ORDER — HYDROCHLOROTHIAZIDE 25 MG PO TABS: ORAL_TABLET | ORAL | 0 refills | Status: DC

## 2017-04-27 MED ORDER — LOSARTAN POTASSIUM 50 MG PO TABS: 50.0000 mg | ORAL_TABLET | Freq: Every day | ORAL | 0 refills | Status: DC

## 2017-04-27 NOTE — Assessment & Plan Note (Signed)
Podiatry referral placed 04/26/17

## 2017-04-27 NOTE — Assessment & Plan Note (Signed)
Potassium 3.2 drawn 04/16/17 K+ levels has chronically run low Started on HCTZ 25mg  M/WF Started on K-Dur 10 MeQ QD Will recheck CMP in 3 weeks at f/u

## 2017-04-27 NOTE — Assessment & Plan Note (Signed)
BP 151/79, HR 74 Re-start Losartan 50mg  (reduced from 100mg ) once daily. Start daily Potassium Chloride 10 MeQ. Start HCTZ 25mg - take Monday/Wednesday/Friday

## 2017-04-27 NOTE — Assessment & Plan Note (Signed)
Continue all current medications as directed. New medication changes- Re-start Losartan 50mg  (reduced from 100mg ) once daily. Start daily Potassium Chloride 10 MeQ. Start HCTZ 25mg - take Monday/Wednesday/Friday Please discuss current urinary symptoms and current antibiotic use with Urologist at your appt tomorrow. Please schedule a follow-up appt with [place] in 3 weeks, re: high blood pressure, chronic kidney disease, low potassium. We will also obtain come labs at your next follow up appt. Referral to Podiatrist has been placed to address bunion. Please keep your complete physical appt in May 2019.

## 2017-04-27 NOTE — Assessment & Plan Note (Signed)
Estimated Creatinine Clearance: 34.5 mL/min (A) (by C-G formula based on SCr of 1.31 mg/dL (H)).  GFR 39

## 2017-04-27 NOTE — Patient Instructions (Addendum)
Hypertension Hypertension, commonly called high blood pressure, is when the force of blood pumping through the arteries is too strong. The arteries are the blood vessels that carry blood from the heart throughout the body. Hypertension forces the heart to work harder to pump blood and may cause arteries to become narrow or stiff. Having untreated or uncontrolled hypertension can cause heart attacks, strokes, kidney disease, and other problems. A blood pressure reading consists of a higher number over a lower number. Ideally, your blood pressure should be below 120/80. The first ("top") number is called the systolic pressure. It is a measure of the pressure in your arteries as your heart beats. The second ("bottom") number is called the diastolic pressure. It is a measure of the pressure in your arteries as the heart relaxes. What are the causes? The cause of this condition is not known. What increases the risk? Some risk factors for high blood pressure are under your control. Others are not. Factors you can change  Smoking.  Having type 2 diabetes mellitus, high cholesterol, or both.  Not getting enough exercise or physical activity.  Being overweight.  Having too much fat, sugar, calories, or salt (sodium) in your diet.  Drinking too much alcohol. Factors that are difficult or impossible to change  Having chronic kidney disease.  Having a family history of high blood pressure.  Age. Risk increases with age.  Race. You may be at higher risk if you are African-American.  Gender. Men are at higher risk than women before age 45. After age 65, women are at higher risk than men.  Having obstructive sleep apnea.  Stress. What are the signs or symptoms? Extremely high blood pressure (hypertensive crisis) may cause:  Headache.  Anxiety.  Shortness of breath.  Nosebleed.  Nausea and vomiting.  Severe chest pain.  Jerky movements you cannot control (seizures).  How is this  diagnosed? This condition is diagnosed by measuring your blood pressure while you are seated, with your arm resting on a surface. The cuff of the blood pressure monitor will be placed directly against the skin of your upper arm at the level of your heart. It should be measured at least twice using the same arm. Certain conditions can cause a difference in blood pressure between your right and left arms. Certain factors can cause blood pressure readings to be lower or higher than normal (elevated) for a short period of time:  When your blood pressure is higher when you are in a health care provider's office than when you are at home, this is called white coat hypertension. Most people with this condition do not need medicines.  When your blood pressure is higher at home than when you are in a health care provider's office, this is called masked hypertension. Most people with this condition may need medicines to control blood pressure.  If you have a high blood pressure reading during one visit or you have normal blood pressure with other risk factors:  You may be asked to return on a different day to have your blood pressure checked again.  You may be asked to monitor your blood pressure at home for 1 week or longer.  If you are diagnosed with hypertension, you may have other blood or imaging tests to help your health care provider understand your overall risk for other conditions. How is this treated? This condition is treated by making healthy lifestyle changes, such as eating healthy foods, exercising more, and reducing your alcohol intake. Your   health care provider may prescribe medicine if lifestyle changes are not enough to get your blood pressure under control, and if:  Your systolic blood pressure is above 130.  Your diastolic blood pressure is above 80.  Your personal target blood pressure may vary depending on your medical conditions, your age, and other factors. Follow these  instructions at home: Eating and drinking  Eat a diet that is high in fiber and potassium, and low in sodium, added sugar, and fat. An example eating plan is called the DASH (Dietary Approaches to Stop Hypertension) diet. To eat this way: ? Eat plenty of fresh fruits and vegetables. Try to fill half of your plate at each meal with fruits and vegetables. ? Eat whole grains, such as whole wheat pasta, brown rice, or whole grain bread. Fill about one quarter of your plate with whole grains. ? Eat or drink low-fat dairy products, such as skim milk or low-fat yogurt. ? Avoid fatty cuts of meat, processed or cured meats, and poultry with skin. Fill about one quarter of your plate with lean proteins, such as fish, chicken without skin, beans, eggs, and tofu. ? Avoid premade and processed foods. These tend to be higher in sodium, added sugar, and fat.  Reduce your daily sodium intake. Most people with hypertension should eat less than 1,500 mg of sodium a day.  Limit alcohol intake to no more than 1 drink a day for nonpregnant women and 2 drinks a day for men. One drink equals 12 oz of beer, 5 oz of wine, or 1 oz of hard liquor. Lifestyle  Work with your health care provider to maintain a healthy body weight or to lose weight. Ask what an ideal weight is for you.  Get at least 30 minutes of exercise that causes your heart to beat faster (aerobic exercise) most days of the week. Activities may include walking, swimming, or biking.  Include exercise to strengthen your muscles (resistance exercise), such as pilates or lifting weights, as part of your weekly exercise routine. Try to do these types of exercises for 30 minutes at least 3 days a week.  Do not use any products that contain nicotine or tobacco, such as cigarettes and e-cigarettes. If you need help quitting, ask your health care provider.  Monitor your blood pressure at home as told by your health care provider.  Keep all follow-up visits as  told by your health care provider. This is important. Medicines  Take over-the-counter and prescription medicines only as told by your health care provider. Follow directions carefully. Blood pressure medicines must be taken as prescribed.  Do not skip doses of blood pressure medicine. Doing this puts you at risk for problems and can make the medicine less effective.  Ask your health care provider about side effects or reactions to medicines that you should watch for. Contact a health care provider if:  You think you are having a reaction to a medicine you are taking.  You have headaches that keep coming back (recurring).  You feel dizzy.  You have swelling in your ankles.  You have trouble with your vision. Get help right away if:  You develop a severe headache or confusion.  You have unusual weakness or numbness.  You feel faint.  You have severe pain in your chest or abdomen.  You vomit repeatedly.  You have trouble breathing. Summary  Hypertension is when the force of blood pumping through your arteries is too strong. If this condition is not   controlled, it may put you at risk for serious complications.  Your personal target blood pressure may vary depending on your medical conditions, your age, and other factors. For most people, a normal blood pressure is less than 120/80.  Hypertension is treated with lifestyle changes, medicines, or a combination of both. Lifestyle changes include weight loss, eating a healthy, low-sodium diet, exercising more, and limiting alcohol. This information is not intended to replace advice given to you by your health care provider. Make sure you discuss any questions you have with your health care provider. Document Released: 01/18/2005 Document Revised: 12/17/2015 Document Reviewed: 12/17/2015 Elsevier Interactive Patient Education  2018 [place].   Chronic Kidney Disease, Adult Chronic kidney disease (CKD) happens when the kidneys  are damaged during a time of 3 or more months. The kidneys are two organs that do many important jobs in the body. These jobs include:  Removing wastes and extra fluids from the blood.  Making hormones that maintain the amount of fluid in your tissues and blood vessels.  Making sure that the body has the right amount of fluids and chemicals.  Most of the time, this condition does not go away, but it can usually be controlled. Steps must be taken to slow down the kidney damage or stop it from getting worse. Otherwise, the kidneys may stop working. Follow these instructions at home:  Follow your diet as told by your doctor. You may need to avoid alcohol, salty foods (sodium), and foods that are high in potassium, calcium, and protein.  Take over-the-counter and prescription medicines only as told by your doctor. Do not take any new medicines unless your doctor says you can do that. These include vitamins and minerals. ? Medicines and nutritional supplements can make kidney damage worse. ? Your doctor may need to change how much medicine you take.  Do not use any tobacco products. These include cigarettes, chewing tobacco, and e-cigarettes. If you need help quitting, ask your doctor.  Keep all follow-up visits as told by your doctor. This is important.  Check your blood pressure. Tell your doctor if there are changes to your blood pressure.  Get to a healthy weight. Stay at that weight. If you need help with this, ask your doctor.  Start or continue an exercise plan. Try to exercise at least 30 minutes a day, 5 days a week.  Stay up-to-date with your shots (immunizations) as told by your doctor. Contact a doctor if:  Your symptoms get worse.  You have new symptoms. Get help right away if:  You have symptoms of end-stage kidney disease. These include: ? Headaches. ? Skin that is darker or lighter than normal. ? Numbness in your hands or feet. ? Easy bruising. ? Having hiccups  often. ? Chest pain. ? Shortness of breath. ? Stopping of menstrual periods in women.  You have a fever.  You are making very little pee (urine).  You have pain or bleeding when you pee (urinate). This information is not intended to replace advice given to you by your health care provider. Make sure you discuss any questions you have with your health care provider. Document Released: 04/14/2009 Document Revised: 06/26/2015 Document Reviewed: 09/17/2011 Elsevier Interactive Patient Education  2017 [place] all current medications as directed. New medication changes- Re-start Losartan 50mg  (reduced from 100mg ) once daily. Start daily Potassium Chloride 10 MeQ. Start HCTZ 25mg - take Monday/Wednesday/Friday Please discuss current urinary symptoms and current antibiotic use with Urologist at your appt  tomorrow. Please schedule a follow-up appt with [place] in 3 weeks, re: high blood pressure, chronic kidney disease, low potassium. We will also obtain come labs at your next follow up appt. Referral to Podiatrist has been placed to address bunion. Please keep your complete physical appt in May 2019. NICE TO SEE YOU!

## 2017-04-28 ENCOUNTER — Telehealth: Payer: Self-pay | Admitting: Adult Health

## 2017-04-28 DIAGNOSIS — C678 Malignant neoplasm of overlapping sites of bladder: Secondary | ICD-10-CM | POA: Diagnosis not present

## 2017-04-28 DIAGNOSIS — Z5111 Encounter for antineoplastic chemotherapy: Secondary | ICD-10-CM | POA: Diagnosis not present

## 2017-04-28 LAB — TSH: TSH: 3.65 u[IU]/mL (ref 0.450–4.500)

## 2017-04-28 LAB — COMPREHENSIVE METABOLIC PANEL
A/G RATIO: 1.4 (ref 1.2–2.2)
ALBUMIN: 3.8 g/dL (ref 3.5–4.8)
ALT: <5 IU/L (ref 0–32)
AST: 18 IU/L (ref 0–40)
Alkaline Phosphatase: 61 IU/L (ref 39–117)
BUN / CREAT RATIO: 7 — AB (ref 12–28)
BUN: 10 mg/dL (ref 8–27)
Bilirubin Total: 0.3 mg/dL (ref 0.0–1.2)
CALCIUM: 9.2 mg/dL (ref 8.7–10.3)
CO2: 19 mmol/L — AB (ref 20–29)
CREATININE: 1.39 mg/dL — AB (ref 0.57–1.00)
Chloride: 109 mmol/L — ABNORMAL HIGH (ref 96–106)
GFR calc Af Amer: 43 mL/min/{1.73_m2} — ABNORMAL LOW (ref >59)
GFR, EST NON AFRICAN AMERICAN: 38 mL/min/{1.73_m2} — AB (ref >59)
GLOBULIN, TOTAL: 2.8 g/dL (ref 1.5–4.5)
Glucose: 96 mg/dL (ref 65–99)
POTASSIUM: 4.4 mmol/L (ref 3.5–5.2)
Sodium: 145 mmol/L — ABNORMAL HIGH (ref 134–144)
Total Protein: 6.6 g/dL (ref 6.0–8.5)

## 2017-04-28 LAB — LIPID PANEL
CHOL/HDL RATIO: 3.1 ratio (ref 0.0–4.4)
Cholesterol, Total: 159 mg/dL (ref 100–199)
HDL: 52 mg/dL (ref >39)
LDL CALC: 87 mg/dL (ref 0–99)
TRIGLYCERIDES: 102 mg/dL (ref 0–149)
VLDL Cholesterol Cal: 20 mg/dL (ref 5–40)

## 2017-04-28 NOTE — Telephone Encounter (Signed)
Merry Proud @ [facility] called to request verbal authorization to Extend [facility] treatment for   (2) times weekly for and addt'l (4) weeks.  Lower Extremities Strengthening , Gait & Balanceing training.  Please call .[phone].  --glh

## 2017-04-28 NOTE — Telephone Encounter (Signed)
Verbal authorization given.  Charyl Bigger, CMA

## 2017-04-28 NOTE — Telephone Encounter (Signed)
See telephone note from 04/28/17.  Charyl Bigger, CMA

## 2017-05-04 DIAGNOSIS — N390 Urinary tract infection, site not specified: Secondary | ICD-10-CM | POA: Diagnosis not present

## 2017-05-04 DIAGNOSIS — G20 Parkinson's disease: Secondary | ICD-10-CM | POA: Diagnosis not present

## 2017-05-04 DIAGNOSIS — B961 Klebsiella pneumoniae [K. pneumoniae] as the cause of diseases classified elsewhere: Secondary | ICD-10-CM | POA: Diagnosis not present

## 2017-05-04 DIAGNOSIS — C679 Malignant neoplasm of bladder, unspecified: Secondary | ICD-10-CM | POA: Diagnosis not present

## 2017-05-04 DIAGNOSIS — M1990 Unspecified osteoarthritis, unspecified site: Secondary | ICD-10-CM | POA: Diagnosis not present

## 2017-05-04 DIAGNOSIS — R634 Abnormal weight loss: Secondary | ICD-10-CM | POA: Diagnosis not present

## 2017-05-05 DIAGNOSIS — Z5111 Encounter for antineoplastic chemotherapy: Secondary | ICD-10-CM | POA: Diagnosis not present

## 2017-05-05 DIAGNOSIS — C678 Malignant neoplasm of overlapping sites of bladder: Secondary | ICD-10-CM | POA: Diagnosis not present

## 2017-05-10 DIAGNOSIS — N390 Urinary tract infection, site not specified: Secondary | ICD-10-CM | POA: Diagnosis not present

## 2017-05-10 DIAGNOSIS — G20 Parkinson's disease: Secondary | ICD-10-CM | POA: Diagnosis not present

## 2017-05-10 DIAGNOSIS — B961 Klebsiella pneumoniae [K. pneumoniae] as the cause of diseases classified elsewhere: Secondary | ICD-10-CM | POA: Diagnosis not present

## 2017-05-10 DIAGNOSIS — C679 Malignant neoplasm of bladder, unspecified: Secondary | ICD-10-CM | POA: Diagnosis not present

## 2017-05-10 DIAGNOSIS — M1990 Unspecified osteoarthritis, unspecified site: Secondary | ICD-10-CM | POA: Diagnosis not present

## 2017-05-10 DIAGNOSIS — R634 Abnormal weight loss: Secondary | ICD-10-CM | POA: Diagnosis not present

## 2017-05-11 DIAGNOSIS — M0579 Rheumatoid arthritis with rheumatoid factor of multiple sites without organ or systems involvement: Secondary | ICD-10-CM | POA: Diagnosis not present

## 2017-05-12 DIAGNOSIS — Z5111 Encounter for antineoplastic chemotherapy: Secondary | ICD-10-CM | POA: Diagnosis not present

## 2017-05-12 DIAGNOSIS — R634 Abnormal weight loss: Secondary | ICD-10-CM | POA: Diagnosis not present

## 2017-05-12 DIAGNOSIS — N390 Urinary tract infection, site not specified: Secondary | ICD-10-CM | POA: Diagnosis not present

## 2017-05-12 DIAGNOSIS — G20 Parkinson's disease: Secondary | ICD-10-CM | POA: Diagnosis not present

## 2017-05-12 DIAGNOSIS — B961 Klebsiella pneumoniae [K. pneumoniae] as the cause of diseases classified elsewhere: Secondary | ICD-10-CM | POA: Diagnosis not present

## 2017-05-12 DIAGNOSIS — M1990 Unspecified osteoarthritis, unspecified site: Secondary | ICD-10-CM | POA: Diagnosis not present

## 2017-05-12 DIAGNOSIS — C678 Malignant neoplasm of overlapping sites of bladder: Secondary | ICD-10-CM | POA: Diagnosis not present

## 2017-05-12 DIAGNOSIS — C679 Malignant neoplasm of bladder, unspecified: Secondary | ICD-10-CM | POA: Diagnosis not present

## 2017-05-13 ENCOUNTER — Telehealth: Payer: Self-pay | Admitting: Adult Health

## 2017-05-13 NOTE — Telephone Encounter (Signed)
Nurse from [facility] just wanted to call and state patient refused skilled nursing visit today.

## 2017-05-16 ENCOUNTER — Ambulatory Visit: Payer: Medicare Other | Admitting: Podiatry

## 2017-05-16 NOTE — Telephone Encounter (Signed)
FYI

## 2017-05-18 DIAGNOSIS — M1990 Unspecified osteoarthritis, unspecified site: Secondary | ICD-10-CM | POA: Diagnosis not present

## 2017-05-18 DIAGNOSIS — B961 Klebsiella pneumoniae [K. pneumoniae] as the cause of diseases classified elsewhere: Secondary | ICD-10-CM | POA: Diagnosis not present

## 2017-05-18 DIAGNOSIS — C679 Malignant neoplasm of bladder, unspecified: Secondary | ICD-10-CM | POA: Diagnosis not present

## 2017-05-18 DIAGNOSIS — R634 Abnormal weight loss: Secondary | ICD-10-CM | POA: Diagnosis not present

## 2017-05-18 DIAGNOSIS — N390 Urinary tract infection, site not specified: Secondary | ICD-10-CM | POA: Diagnosis not present

## 2017-05-18 DIAGNOSIS — G20 Parkinson's disease: Secondary | ICD-10-CM | POA: Diagnosis not present

## 2017-05-20 DIAGNOSIS — C679 Malignant neoplasm of bladder, unspecified: Secondary | ICD-10-CM | POA: Diagnosis not present

## 2017-05-20 DIAGNOSIS — R634 Abnormal weight loss: Secondary | ICD-10-CM | POA: Diagnosis not present

## 2017-05-20 DIAGNOSIS — G20 Parkinson's disease: Secondary | ICD-10-CM | POA: Diagnosis not present

## 2017-05-20 DIAGNOSIS — N390 Urinary tract infection, site not specified: Secondary | ICD-10-CM | POA: Diagnosis not present

## 2017-05-20 DIAGNOSIS — M1990 Unspecified osteoarthritis, unspecified site: Secondary | ICD-10-CM | POA: Diagnosis not present

## 2017-05-20 DIAGNOSIS — B961 Klebsiella pneumoniae [K. pneumoniae] as the cause of diseases classified elsewhere: Secondary | ICD-10-CM | POA: Diagnosis not present

## 2017-05-23 ENCOUNTER — Ambulatory Visit: Payer: Medicare Other | Admitting: Adult Health

## 2017-05-27 ENCOUNTER — Ambulatory Visit (INDEPENDENT_AMBULATORY_CARE_PROVIDER_SITE_OTHER): Payer: Medicare Other | Admitting: Podiatry

## 2017-05-27 ENCOUNTER — Encounter: Payer: Self-pay | Admitting: Podiatry

## 2017-05-27 ENCOUNTER — Ambulatory Visit (INDEPENDENT_AMBULATORY_CARE_PROVIDER_SITE_OTHER): Payer: Medicare Other

## 2017-05-27 VITALS — BP 136/83 | HR 74

## 2017-05-27 DIAGNOSIS — M2041 Other hammer toe(s) (acquired), right foot: Secondary | ICD-10-CM

## 2017-05-27 DIAGNOSIS — M79675 Pain in left toe(s): Secondary | ICD-10-CM | POA: Diagnosis not present

## 2017-05-27 DIAGNOSIS — M2042 Other hammer toe(s) (acquired), left foot: Secondary | ICD-10-CM

## 2017-05-27 DIAGNOSIS — M21619 Bunion of unspecified foot: Secondary | ICD-10-CM | POA: Diagnosis not present

## 2017-05-27 DIAGNOSIS — Q828 Other specified congenital malformations of skin: Secondary | ICD-10-CM

## 2017-05-30 NOTE — Progress Notes (Signed)
Subjective:    Patient ID: Tara Ingram, female    DOB: May 03, 1945, 72 y.o.   MRN: [id number]  HPI: 03/23/17 OV: Ms. Tara Ingram is here to establish as a new pt.  She is a pleasant 72 yr old female.   PMH: HTN, OSA, GERD, HL., Bladder Ca (TURBT 10/17/15) Parkinson's Disease, and recent hospitalization for acute cystitis w/o hematuria, as well as, dehydration/hypokalemia,and renal failure  She completed Cirpo 500mg  BID x 5 days Referral to GI/Dr. Rande Brunt for colonoscopy She is followed by [facility], has OV next week She reports slowly improving appetite since hospitalization. She had 20-30 unintentional lb weight prior to hospitalization. Se is followed by Neuro for Parkinson's, treated with rotigotine 6mg /24hr transdermal patch. She reports weakness and instability with recent hx of falls. Husband at [facility] during [place]  03/24/17 OV: Ms. Tara Ingram was unable to obtain BP reading with home machine, therefore she was asked to present to clinic for evaluation. She denies CP/increased dyspnea/palpitatoins/dizziness.  She reports "I feel pretty darn great". She did loss sig amount of wt due to acute illness the last few months. She has never been evaluated by cardiology Husband at [facility] during [place] given to pt and her husband- HOLD Losartan  Check Blood pressure and heart rate twice daily- record Discussed Red Flag sx's and when to seek immediate medical care Call us Monday with readings- which would have been 03/28/17 Pt and her husband verbalized understanding.  04/27/17 OV: Pt never followed up with [place] on 03/28/17, re: hypotension She suffered fall 04/16/17- treated at local ED for UTI and poss fx of sacrum.  She was pain rx and 10 day course of Cipro, she has been taking inconsistently and reports having a few days remaining on course.  She continues to have urinary frequency, she denies dysuria or hematuria. Home health RN called yesterday with several issues- elevated BP,  lower ext edema, bunion, low temp She has not been on losartan 100mg  since 03/24/17  04/16/17- BMP: ser creat 1.31, GFR 39 CrCl cannot be calculated (Patient's most recent lab result is older than the maximum 21 days allowed.).  She has appt with Urologist tomorrow- they can address urinary sx's and abx therapy.  Referral to Podiatrist has been placed to address bunion.  She reports being on diuretic in past and stopped it "b/c it made me pee all the time".  She denies ever being told about she has CKD.  She does recall being told that her K+ is on lower side.  Temp today 97.9  Bilateral lower ext edema 1+. She denies CP/dyspnea/palpitations.  She has only gained 2+ since last OV 03/23/17 She denies hx of CHF  Husband at [facility] during [place]  05/31/17 OV: Ms. Tara Ingram is here for f/u:HTN She reports medication compliance, with one exception- she has been taking HCTZ 25mg  PRN instead of the M/W/F schedule we had dicussed. She denies CP/dyspnea/palpitations. She continues to remain well hydrated and eats a heart healthy diet.  Patient Care Team    Relationship Specialty Notifications Start End  Mina Marble D, NP PCP - General Family Medicine  02/21/17   Bjorn Pippin, PA-C Physician Assistant Emergency Medicine  03/23/17   Hennie Duos, MD Consulting Physician Rheumatology  03/23/17   Melvenia Beam, MD Consulting Physician Neurology  03/23/17   Rachelle Hora, MD Attending Physician Gastroenterology  03/23/17   Druscilla Brownie, MD Consulting Physician Dermatology  03/23/17   Margaretha Sheffield, MD Referring Physician Physical  Medicine and Rehabilitation  03/23/17   Ardis Hughs, MD Attending Physician Urology  03/23/17   Marilynne Halsted, MD Referring Physician Ophthalmology  03/23/17   Guido Sander, PT Physical Therapist Physical Therapy  03/23/17     Patient Active Problem List   Diagnosis Date Noted  . CKD (chronic kidney disease), stage III ([place]) 04/27/2017  .  Hypokalemia 04/27/2017  . Bunion 04/27/2017  . Hypotension 03/24/2017  . Healthcare maintenance 03/23/2017  . Hx of colonic polyps 03/23/2017  . Essential hypertension 03/23/2017  . UTI (urinary tract infection) 03/13/2017  . History of bladder cancer 03/13/2017  . Unintentional weight loss 03/13/2017  . Parkinson's disease ([place]) 11/16/2016  . Gait abnormality 10/06/2016  . Resting tremor 10/06/2016  . Malignant tumor of trigone of bladder ([place]) 09/18/2015  . OSA (obstructive sleep apnea) 12/30/2014  . Closed wedge compression fracture of second lumbar vertebra ([place])   . Mechanical complication due to intraocular lens implant 11/18/2010  . Nuclear cataract 11/18/2010  . DIARRHEA 04/17/2010     Past Medical History:  Diagnosis Date  . Allergy   . Arthritis    hands/feet/back ra  . Bladder cancer ([place])   . Cataract    bilateral  . GERD (gastroesophageal reflux disease)   . Headache   . History of kidney stones 50 yrs ago  . Hyperlipidemia   . Hypertension   . Sleep apnea    uses CPAP every night setting of 12     Past Surgical History:  Procedure Laterality Date  . APPENDECTOMY    . BACK SURGERY  05/2014   lower back for crushed disc  . CHOLECYSTECTOMY    . COLONOSCOPY  12/2011   hx polyps/jacobs  . CORNEAL TRANSPLANT     bilateral  . CYSTOSCOPY W/ RETROGRADES Bilateral 09/18/2015   Procedure: BILATERAL RETROGRADE PYELOGRAM;  Surgeon: Ardis Hughs, MD;  Location: [facility];  Service: Urology;  Laterality: Bilateral;  . CYSTOSCOPY W/ RETROGRADES Right 10/17/2015   Procedure: CYSTOSCOPY WITH RETROGRADE PYELOGRAM;  Surgeon: Ardis Hughs, MD;  Location: [facility];  Service: Urology;  Laterality: Right;  . EYE SURGERY Bilateral    lens replacement for cataract  . FOOT SURGERY Left 6 yrs ago   tammer toe reconstruction  . PARTIAL HYSTERECTOMY    . TONSILLECTOMY    . TRANSURETHRAL RESECTION OF BLADDER TUMOR N/A 09/18/2015   Procedure: TRANSURETHRAL RESECTION OF  BLADDER TUMOR (TURBT);  Surgeon: Ardis Hughs, MD;  Location: [facility];  Service: Urology;  Laterality: N/A;  . TRANSURETHRAL RESECTION OF BLADDER TUMOR N/A 10/17/2015   Procedure: TRANSURETHRAL RESECTION OF BLADDER TUMOR (TURBT);  Surgeon: Ardis Hughs, MD;  Location: [facility];  Service: Urology;  Laterality: N/A;     Family History  Problem Relation Age of Onset  . Colon cancer Sister 46  . Allergies Mother   . Heart disease Mother   . Rheumatologic disease Mother   . Heart attack Mother   . Allergies Father   . Asthma Father   . Heart disease Father   . Heart attack Father   . Esophageal cancer Neg Hx   . Colon polyps Neg Hx   . Rectal cancer Neg Hx   . Stomach cancer Neg Hx      Social History   Substance and Sexual Activity  Drug Use No     Social History   Substance and Sexual Activity  Alcohol Use No  . Alcohol/week: 0.0 oz     Social  History   Tobacco Use  Smoking Status Never Smoker  Smokeless Tobacco Never Used     Outpatient Encounter Medications as of 05/31/2017  Medication Sig  . denosumab (PROLIA) 60 MG/ML SOLN injection Inject 60 mg into the skin every 6 (six) months. Administer in upper arm, thigh, or abdomen  . diclofenac sodium (VOLTAREN) 1 % GEL Apply 1 application topically 2 (two) times daily as needed (pain).   . hydrochlorothiazide (HYDRODIURIL) 25 MG tablet 1 tab daily Monday, Wednesday, Friday  . losartan (COZAAR) 50 MG tablet Take 1 tablet (50 mg total) by mouth daily.  . mirabegron ER (MYRBETRIQ) 50 MG TB24 tablet Take 50 mg by mouth daily.  Marland Kitchen oxyCODONE-acetaminophen (PERCOCET/ROXICET) 5-325 MG tablet Take 1-2 tablets by mouth every 6 (six) hours as needed for severe pain.  . potassium chloride (K-DUR) 10 MEQ tablet Take 1 tablet (10 mEq total) by mouth daily.  . rotigotine (NEUPRO) 6 MG/24HR Place 1 patch onto the skin daily. (Patient not taking: Reported on 05/31/2017)  . tiZANidine (ZANAFLEX) 2 MG tablet Take 2 mg by mouth at  bedtime as needed for muscle spasms.   . [DISCONTINUED] ciprofloxacin (CIPRO) 500 MG tablet Take 1 tablet (500 mg total) by mouth every 12 (twelve) hours.  . [DISCONTINUED] hydrochlorothiazide (HYDRODIURIL) 25 MG tablet 1 tab daily Monday, Wednesday, Friday   No facility-administered encounter medications on file as of 05/31/2017.     Allergies: Patient has no known allergies.  Body mass index is 25.57 kg/m.  Blood pressure 120/68, pulse 78, height 5\' 2"  (1.575 m), weight 139 lb 12.8 oz (63.4 kg), SpO2 97 %.  HR 72   Review of Systems  Constitutional: Positive for activity change, appetite change and fatigue. Negative for chills, diaphoresis, fever and unexpected weight change.  Respiratory: Negative for cough, chest tightness, shortness of breath, wheezing and stridor.   Cardiovascular: Positive for leg swelling. Negative for chest pain and palpitations.  Gastrointestinal: Negative for abdominal distention, anal bleeding, blood in stool, constipation, diarrhea, nausea and vomiting.  Endocrine: Negative for cold intolerance, heat intolerance, polyphagia and polyuria.  Genitourinary: Negative for difficulty urinating, dysuria, flank pain, frequency, hematuria and urgency.  Musculoskeletal: Positive for arthralgias, back pain, gait problem, joint swelling, myalgias, neck pain and neck stiffness.  Skin: Positive for pallor. Negative for color change, rash and wound.  Neurological: Positive for tremors and weakness. Negative for dizziness, seizures, facial asymmetry and speech difficulty.       Decreased memory  Hematological: Does not bruise/bleed easily.  Psychiatric/Behavioral: Negative for decreased concentration, hallucinations, self-injury, sleep disturbance and suicidal ideas. The patient is not nervous/anxious and is not hyperactive.        Objective:   Physical Exam  Constitutional: She is oriented to person, place, and time. She appears well-developed and well-nourished.   HENT:  Head: Normocephalic and atraumatic.  Left Ear: External ear normal.  Eyes: Pupils are equal, round, and reactive to light. Conjunctivae are normal.  Cardiovascular: Normal rate, regular rhythm, normal heart sounds and intact distal pulses.  No murmur heard. Pulmonary/Chest: Effort normal and breath sounds normal. No respiratory distress. She has no wheezes. She has no rales. She exhibits no tenderness.  Abdominal: Soft. Bowel sounds are normal. She exhibits no distension and no mass. There is no tenderness. There is no rigidity, no rebound, no guarding and no CVA tenderness.  Musculoskeletal: She exhibits no edema.       Right ankle: She exhibits swelling.       Left ankle:  She exhibits swelling.       Right foot: There is swelling.       Left foot: There is swelling.  Neurological: She is alert and oriented to person, place, and time. She exhibits abnormal muscle tone. Coordination abnormal.  Very slow, shuffled gait  Skin: She is not diaphoretic. There is pallor.  Psychiatric: She has a normal mood and affect. Her behavior is normal. Judgment and thought content normal.      Assessment & Plan:   1. Essential hypertension   2. Hypokalemia   3. Healthcare maintenance     Essential hypertension BP at goal 120/68 HR 78 Continue Losartan 50mg  QD and HCTZ 25mg  M/W/F- pt verbalized understanding/agreement She denies acute cardiac sx's  Healthcare maintenance Please continue all medications as directed. Please be sure to take HCTZ 25mg - take Monday/Wednesday/Friday Please call clinic if you need medication refills, I am happy to fill up to 6 month supply if needed- assist in your June move to [facility]  Hypokalemia CMP rechecked today She has been taking KDur 10 mEq QD She has been intermittently taking HCTZ  Discussed that she is to take HCTZ 25mg  M/W/F- pt verbalized understanding/agreement She denies acute cardiac sx's   FOLLOW-UP:  Return in about 1 month (around  06/28/2017) for CPE.

## 2017-05-30 NOTE — Progress Notes (Signed)
Subjective:   Patient ID: Tara Ingram, female   DOB: 72 y.o.   MRN: [id number]   HPI 72 year old female presents the office today for concerns of severe bunion deformity in her right side as well as her left foot starting to have issues to her second toe is growing crooked.  She has a history of bunion surgery in her left side, Lapidus bunionectomy.  She states the right side is a very severe deformity that is been ongoing for several years and does cause irritation side with pressure in shoes.  She said no recent treatment for this.  She does not want surgery.  She has no other concerns.   Review of Systems  All other systems reviewed and are negative.  Past Medical History:  Diagnosis Date  . Allergy   . Arthritis    hands/feet/back ra  . Bladder cancer ([place])   . Cataract    bilateral  . GERD (gastroesophageal reflux disease)   . Headache   . History of kidney stones 50 yrs ago  . Hyperlipidemia   . Hypertension   . Sleep apnea    uses CPAP every night setting of 12    Past Surgical History:  Procedure Laterality Date  . APPENDECTOMY    . BACK SURGERY  05/2014   lower back for crushed disc  . CHOLECYSTECTOMY    . COLONOSCOPY  12/2011   hx polyps/jacobs  . CORNEAL TRANSPLANT     bilateral  . CYSTOSCOPY W/ RETROGRADES Bilateral 09/18/2015   Procedure: BILATERAL RETROGRADE PYELOGRAM;  Surgeon: Ardis Hughs, MD;  Location: [facility];  Service: Urology;  Laterality: Bilateral;  . CYSTOSCOPY W/ RETROGRADES Right 10/17/2015   Procedure: CYSTOSCOPY WITH RETROGRADE PYELOGRAM;  Surgeon: Ardis Hughs, MD;  Location: [facility];  Service: Urology;  Laterality: Right;  . EYE SURGERY Bilateral    lens replacement for cataract  . FOOT SURGERY Left 6 yrs ago   tammer toe reconstruction  . PARTIAL HYSTERECTOMY    . TONSILLECTOMY    . TRANSURETHRAL RESECTION OF BLADDER TUMOR N/A 09/18/2015   Procedure: TRANSURETHRAL RESECTION OF BLADDER TUMOR (TURBT);  Surgeon: Ardis Hughs, MD;  Location: [facility];  Service: Urology;  Laterality: N/A;  . TRANSURETHRAL RESECTION OF BLADDER TUMOR N/A 10/17/2015   Procedure: TRANSURETHRAL RESECTION OF BLADDER TUMOR (TURBT);  Surgeon: Ardis Hughs, MD;  Location: [facility];  Service: Urology;  Laterality: N/A;     Current Outpatient Medications:  .  ciprofloxacin (CIPRO) 500 MG tablet, Take 1 tablet (500 mg total) by mouth every 12 (twelve) hours., Disp: 20 tablet, Rfl: 0 .  denosumab (PROLIA) 60 MG/ML SOLN injection, Inject 60 mg into the skin every 6 (six) months. Administer in upper arm, thigh, or abdomen, Disp: , Rfl:  .  diclofenac sodium (VOLTAREN) 1 % GEL, Apply 1 application topically 2 (two) times daily as needed (pain). , Disp: , Rfl: 5 .  hydrochlorothiazide (HYDRODIURIL) 25 MG tablet, 1 tab daily Monday, Wednesday, Friday, Disp: 90 tablet, Rfl: 0 .  losartan (COZAAR) 50 MG tablet, Take 1 tablet (50 mg total) by mouth daily., Disp: 90 tablet, Rfl: 0 .  mirabegron ER (MYRBETRIQ) 50 MG TB24 tablet, Take 50 mg by mouth daily., Disp: , Rfl:  .  oxyCODONE-acetaminophen (PERCOCET/ROXICET) 5-325 MG tablet, Take 1-2 tablets by mouth every 6 (six) hours as needed for severe pain., Disp: 15 tablet, Rfl: 0 .  potassium chloride (K-DUR) 10 MEQ tablet, Take 1 tablet (10 mEq total)  by mouth daily., Disp: 90 tablet, Rfl: 0 .  rotigotine (NEUPRO) 6 MG/24HR, Place 1 patch onto the skin daily., Disp: 90 patch, Rfl: 5 .  tiZANidine (ZANAFLEX) 2 MG tablet, Take 2 mg by mouth at bedtime as needed for muscle spasms. , Disp: , Rfl: 2  No Known Allergies  Social History   Socioeconomic History  . Marital status: Married    Spouse name: Jeneen Rinks  . Number of children: 0  . Years of education: 13  . Highest education level: Not on file  Occupational History  . Occupation: Retired  Scientific laboratory technician  . Financial resource strain: Not on file  . Food insecurity:    Worry: Not on file    Inability: Not on file  . Transportation needs:     Medical: Not on file    Non-medical: Not on file  Tobacco Use  . Smoking status: Never Smoker  . Smokeless tobacco: Never Used  Substance and Sexual Activity  . Alcohol use: No    Alcohol/week: 0.0 oz  . Drug use: No  . Sexual activity: Yes    Birth control/protection: Surgical    Comment: hysterectomy  Lifestyle  . Physical activity:    Days per week: Not on file    Minutes per session: Not on file  . Stress: Not on file  Relationships  . Social connections:    Talks on phone: Not on file    Gets together: Not on file    Attends religious service: Not on file    Active member of club or organization: Not on file    Attends meetings of clubs or organizations: Not on file    Relationship status: Not on file  . Intimate partner violence:    Fear of current or ex partner: Not on file    Emotionally abused: Not on file    Physically abused: Not on file    Forced sexual activity: Not on file  Other Topics Concern  . Not on file  Social History Narrative   Lives at home w/ her husband   Right-handed   Caffeine: 3-4 glasses daily         Objective:  Physical Exam  General: AAO x3, NAD  Dermatological: There is slight erythema due to pressure along the medial first metatarsal head on the right foot due to the severe bunion deformity there is no open lesion identified bilaterally.  Mild hyperkeratotic tissue present right foot submetatarsal 1.  No underlying ulceration, drainage or signs of infection present.  There is evidence of dried blood underneath the callus.  Vascular: Dorsalis Pedis artery and Posterior Tibial artery pedal pulses are 2/4 bilateral with immedate capillary fill time. P There is no pain with calf compression, swelling, warmth, erythema.   Neruologic: Grossly intact via light touch bilateral.Protective threshold with Semmes Wienstein monofilament intact to all pedal sites bilateral.   Musculoskeletal:.  Prominent submetatarsal 1 result of the  hyperkeratotic lesion of the right foot.  Also digital deformity to the left second toe as there is a dorsiflexion curvature present to the second digit.  No open lesions are identified in this area.  There is very severe bunion deformity of the right foot and there is dislocation of the first MTPJ as the hallux is drifting laterally Muscular strength 5/5 in all groups tested bilateral.  Gait: Unassisted, Nonantalgic.       Assessment:   Severe HAV right foot, hammertoe deformity multiple digital contractures     Plan:  -  Treatment options discussed including all alternatives, risks, and complications -Etiology of symptoms were discussed -X-rays were obtained and reviewed with the patient.   There is severe bony deformities present in the right foot there is dislocation of the right first MPJ.  Digital contractures are present.  No evidence of acute fracture. -We discussed both conservative as well as surgical treatment options.  She declined surgery.  X2.  Offloading pads we discussed shoe modifications.  I lightly debrided the hyperkeratotic tissue to the right foot metatarsal without any complications or bleeding she is to monitor this area very closely for any skin breakdown this is been the very point ulceration. -Discussed the importance of daily foot inspection   Trula Slade DPM

## 2017-05-31 ENCOUNTER — Encounter: Payer: Self-pay | Admitting: Adult Health

## 2017-05-31 ENCOUNTER — Ambulatory Visit (INDEPENDENT_AMBULATORY_CARE_PROVIDER_SITE_OTHER): Payer: Medicare Other | Admitting: Adult Health

## 2017-05-31 VITALS — BP 120/68 | HR 78 | Ht 62.0 in | Wt 139.8 lb

## 2017-05-31 DIAGNOSIS — E876 Hypokalemia: Secondary | ICD-10-CM | POA: Diagnosis not present

## 2017-05-31 DIAGNOSIS — I10 Essential (primary) hypertension: Secondary | ICD-10-CM

## 2017-05-31 DIAGNOSIS — Z0000 Encounter for general adult medical examination without abnormal findings: Secondary | ICD-10-CM

## 2017-05-31 MED ORDER — HYDROCHLOROTHIAZIDE 25 MG PO TABS: ORAL_TABLET | ORAL | 0 refills | Status: AC

## 2017-05-31 NOTE — Patient Instructions (Signed)
Mediterranean Diet A Mediterranean diet refers to food and lifestyle choices that are based on the traditions of countries located on the [place]. This way of eating has been shown to help prevent certain conditions and improve outcomes for people who have chronic diseases, like kidney disease and heart disease. What are tips for following this plan? Lifestyle  Cook and eat meals together with your family, when possible.  Drink enough fluid to keep your urine clear or pale yellow.  Be physically active every day. This includes: ? Aerobic exercise like running or swimming. ? Leisure activities like gardening, walking, or housework.  Get 7-8 hours of sleep each night.  If recommended by your health care provider, drink red wine in moderation. This means 1 glass a day for nonpregnant women and 2 glasses a day for men. A glass of wine equals 5 oz (150 mL). Reading food labels  Check the serving size of packaged foods. For foods such as rice and pasta, the serving size refers to the amount of cooked product, not dry.  Check the total fat in packaged foods. Avoid foods that have saturated fat or trans fats.  Check the ingredients list for added sugars, such as corn syrup. Shopping  At the grocery store, buy most of your food from the areas near the walls of the store. This includes: ? Fresh fruits and vegetables (produce). ? Grains, beans, nuts, and seeds. Some of these may be available in unpackaged forms or large amounts (in bulk). ? Fresh seafood. ? Poultry and eggs. ? Low-fat dairy products.  Buy whole ingredients instead of prepackaged foods.  Buy fresh fruits and vegetables in-season from local farmers markets.  Buy frozen fruits and vegetables in resealable bags.  If you do not have access to quality fresh seafood, buy precooked frozen shrimp or canned fish, such as tuna, salmon, or sardines.  Buy small amounts of raw or cooked vegetables, salads, or olives from the  deli or salad bar at your store.  Stock your pantry so you always have certain foods on hand, such as olive oil, canned tuna, canned tomatoes, rice, pasta, and beans. Cooking  Cook foods with extra-virgin olive oil instead of using butter or other vegetable oils.  Have meat as a side dish, and have vegetables or grains as your main dish. This means having meat in small portions or adding small amounts of meat to foods like pasta or stew.  Use beans or vegetables instead of meat in common dishes like chili or lasagna.  Experiment with different cooking methods. Try roasting or broiling vegetables instead of steaming or sauteing them.  Add frozen vegetables to soups, stews, pasta, or rice.  Add nuts or seeds for added healthy fat at each meal. You can add these to yogurt, salads, or vegetable dishes.  Marinate fish or vegetables using olive oil, lemon juice, garlic, and fresh herbs. Meal planning  Plan to eat 1 vegetarian meal one day each week. Try to work up to 2 vegetarian meals, if possible.  Eat seafood 2 or more times a week.  Have healthy snacks readily available, such as: ? Vegetable sticks with hummus. ? Greek yogurt. ? Fruit and nut trail mix.  Eat balanced meals throughout the week. This includes: ? Fruit: 2-3 servings a day ? Vegetables: 4-5 servings a day ? Low-fat dairy: 2 servings a day ? Fish, poultry, or lean meat: 1 serving a day ? Beans and legumes: 2 or more servings a week ? Nuts   and seeds: 1-2 servings a day ? Whole grains: 6-8 servings a day ? Extra-virgin olive oil: 3-4 servings a day  Limit red meat and sweets to only a few servings a month What are my food choices?  Mediterranean diet ? Recommended ? Grains: Whole-grain pasta. Brown rice. Bulgar wheat. Polenta. Couscous. Whole-wheat bread. [place]. ? Vegetables: Artichokes. Beets. Broccoli. Cabbage. Carrots. Eggplant. Green beans. Chard. Kale. Spinach. Onions. Leeks. Peas. Squash.  Tomatoes. Peppers. Radishes. ? Fruits: Apples. Apricots. Avocado. Berries. Bananas. Cherries. Dates. Figs. Grapes. Lemons. Melon. Oranges. Peaches. Plums. Pomegranate. ? Meats and other protein foods: Beans. Almonds. Sunflower seeds. Pine nuts. Peanuts. [place]. Salmon. Scallops. Shrimp. [place]. Tilapia. Clams. Oysters. Eggs. ? Dairy: Low-fat milk. Cheese. Greek yogurt. ? Beverages: Water. Red wine. Herbal tea. ? Fats and oils: Extra virgin olive oil. Avocado oil. Grape seed oil. ? Sweets and desserts: [place] yogurt with honey. Baked apples. Poached pears. Trail mix. ? Seasoning and other foods: Basil. Cilantro. Coriander. Cumin. Mint. Parsley. Sage. Rosemary. Tarragon. Garlic. Oregano. Thyme. Pepper. Balsalmic vinegar. Tahini. Hummus. Tomato sauce. Olives. Mushrooms. ? Limit these ? Grains: Prepackaged pasta or rice dishes. Prepackaged cereal with added sugar. ? Vegetables: Deep fried potatoes (french fries). ? Fruits: Fruit canned in syrup. ? Meats and other protein foods: Beef. Pork. Lamb. Poultry with skin. Hot dogs. Bernie[place] Salines. ? Dairy: Ice cream. Sour cream. Whole milk. ? Beverages: Juice. Sugar-sweetened soft drinks. Beer. Liquor and spirits. ? Fats and oils: Butter. Canola oil. Vegetable oil. Beef fat (tallow). Lard. ? Sweets and desserts: Cookies. Cakes. Pies. Candy. ? Seasoning and other foods: Mayonnaise. Premade sauces and marinades. ? The items listed may not be a complete list. Talk with your dietitian about what dietary choices are right for you. Summary  The Mediterranean diet includes both food and lifestyle choices.  Eat a variety of fresh fruits and vegetables, beans, nuts, seeds, and whole grains.  Limit the amount of red meat and sweets that you eat.  Talk with your health care provider about whether it is safe for you to drink red wine in moderation. This means 1 glass a day for nonpregnant women and 2 glasses a day for men. A glass of wine equals 5 oz (150 mL). This information  is not intended to replace advice given to you by your health care provider. Make sure you discuss any questions you have with your health care provider. Document Released: 09/11/2015 Document Revised: 10/14/2015 Document Reviewed: 09/11/2015 Elsevier Interactive Patient Education  2018 [employer].    Hypertension Hypertension, commonly called high blood pressure, is when the force of blood pumping through the arteries is too strong. The arteries are the blood vessels that carry blood from the heart throughout the body. Hypertension forces the heart to work harder to pump blood and may cause arteries to become narrow or stiff. Having untreated or uncontrolled hypertension can cause heart attacks, strokes, kidney disease, and other problems. A blood pressure reading consists of a higher number over a lower number. Ideally, your blood pressure should be below 120/80. The first ("top") number is called the systolic pressure. It is a measure of the pressure in your arteries as your heart beats. The second ("bottom") number is called the diastolic pressure. It is a measure of the pressure in your arteries as the heart relaxes. What are the causes? The cause of this condition is not known. What increases the risk? Some risk factors for high blood pressure are under your control. Others are not. Factors you  can change  Smoking.  Having type 2 diabetes mellitus, high cholesterol, or both.  Not getting enough exercise or physical activity.  Being overweight.  Having too much fat, sugar, calories, or salt (sodium) in your diet.  Drinking too much alcohol. Factors that are difficult or impossible to change  Having chronic kidney disease.  Having a family history of high blood pressure.  Age. Risk increases with age.  Race. You may be at higher risk if you are African-American.  Gender. Men are at higher risk than women before age 19. After age 64, women are at higher risk than  men.  Having obstructive sleep apnea.  Stress. What are the signs or symptoms? Extremely high blood pressure (hypertensive crisis) may cause:  Headache.  Anxiety.  Shortness of breath.  Nosebleed.  Nausea and vomiting.  Severe chest pain.  Jerky movements you cannot control (seizures).  How is this diagnosed? This condition is diagnosed by measuring your blood pressure while you are seated, with your arm resting on a surface. The cuff of the blood pressure monitor will be placed directly against the skin of your upper arm at the level of your heart. It should be measured at least twice using the same arm. Certain conditions can cause a difference in blood pressure between your right and left arms. Certain factors can cause blood pressure readings to be lower or higher than normal (elevated) for a short period of time:  When your blood pressure is higher when you are in a health care provider's office than when you are at home, this is called white coat hypertension. Most people with this condition do not need medicines.  When your blood pressure is higher at home than when you are in a health care provider's office, this is called masked hypertension. Most people with this condition may need medicines to control blood pressure.  If you have a high blood pressure reading during one visit or you have normal blood pressure with other risk factors:  You may be asked to return on a different day to have your blood pressure checked again.  You may be asked to monitor your blood pressure at home for 1 week or longer.  If you are diagnosed with hypertension, you may have other blood or imaging tests to help your health care provider understand your overall risk for other conditions. How is this treated? This condition is treated by making healthy lifestyle changes, such as eating healthy foods, exercising more, and reducing your alcohol intake. Your health care provider may prescribe  medicine if lifestyle changes are not enough to get your blood pressure under control, and if:  Your systolic blood pressure is above 130.  Your diastolic blood pressure is above 80.  Your personal target blood pressure may vary depending on your medical conditions, your age, and other factors. Follow these instructions at home: Eating and drinking  Eat a diet that is high in fiber and potassium, and low in sodium, added sugar, and fat. An example eating plan is called the DASH (Dietary Approaches to Stop Hypertension) diet. To eat this way: ? Eat plenty of fresh fruits and vegetables. Try to fill half of your plate at each meal with fruits and vegetables. ? Eat whole grains, such as whole wheat pasta, brown rice, or whole grain bread. Fill about one quarter of your plate with whole grains. ? Eat or drink low-fat dairy products, such as skim milk or low-fat yogurt. ? Avoid fatty cuts of meat,  processed or cured meats, and poultry with skin. Fill about one quarter of your plate with lean proteins, such as fish, chicken without skin, beans, eggs, and tofu. ? Avoid premade and processed foods. These tend to be higher in sodium, added sugar, and fat.  Reduce your daily sodium intake. Most people with hypertension should eat less than 1,500 mg of sodium a day.  Limit alcohol intake to no more than 1 drink a day for nonpregnant women and 2 drinks a day for men. One drink equals 12 oz of beer, 5 oz of wine, or 1 oz of hard liquor. Lifestyle  Work with your health care provider to maintain a healthy body weight or to lose weight. Ask what an ideal weight is for you.  Get at least 30 minutes of exercise that causes your heart to beat faster (aerobic exercise) most days of the week. Activities may include walking, swimming, or biking.  Include exercise to strengthen your muscles (resistance exercise), such as pilates or lifting weights, as part of your weekly exercise routine. Try to do these types  of exercises for 30 minutes at least 3 days a week.  Do not use any products that contain nicotine or tobacco, such as cigarettes and e-cigarettes. If you need help quitting, ask your health care provider.  Monitor your blood pressure at home as told by your health care provider.  Keep all follow-up visits as told by your health care provider. This is important. Medicines  Take over-the-counter and prescription medicines only as told by your health care provider. Follow directions carefully. Blood pressure medicines must be taken as prescribed.  Do not skip doses of blood pressure medicine. Doing this puts you at risk for problems and can make the medicine less effective.  Ask your health care provider about side effects or reactions to medicines that you should watch for. Contact a health care provider if:  You think you are having a reaction to a medicine you are taking.  You have headaches that keep coming back (recurring).  You feel dizzy.  You have swelling in your ankles.  You have trouble with your vision. Get help right away if:  You develop a severe headache or confusion.  You have unusual weakness or numbness.  You feel faint.  You have severe pain in your chest or abdomen.  You vomit repeatedly.  You have trouble breathing. Summary  Hypertension is when the force of blood pumping through your arteries is too strong. If this condition is not controlled, it may put you at risk for serious complications.  Your personal target blood pressure may vary depending on your medical conditions, your age, and other factors. For most people, a normal blood pressure is less than 120/80.  Hypertension is treated with lifestyle changes, medicines, or a combination of both. Lifestyle changes include weight loss, eating a healthy, low-sodium diet, exercising more, and limiting alcohol. This information is not intended to replace advice given to you by your health care provider.  Make sure you discuss any questions you have with your health care provider. Document Released: 01/18/2005 Document Revised: 12/17/2015 Document Reviewed: 12/17/2015 Elsevier Interactive Patient Education  [employer].  Please continue all medications as directed. Please be sure to take HCTZ 25mg - take Monday/Wednesday/Friday Please call clinic if you need medication refills, I am happy to fill up to 6 month supply if needed. NICE TO SEE YOU!

## 2017-05-31 NOTE — Assessment & Plan Note (Addendum)
Please continue all medications as directed. Please be sure to take HCTZ 25mg - take Monday/Wednesday/Friday Please call clinic if you need medication refills, I am happy to fill up to 6 month supply if needed- assist in your June move to [facility]

## 2017-05-31 NOTE — Assessment & Plan Note (Signed)
CMP rechecked today She has been taking KDur 10 mEq QD She has been intermittently taking HCTZ  Discussed that she is to take HCTZ 25mg  M/W/F- pt verbalized understanding/agreement She denies acute cardiac sx's

## 2017-05-31 NOTE — Assessment & Plan Note (Signed)
BP at goal 120/68 HR 78 Continue Losartan 50mg  QD and HCTZ 25mg  M/W/F- pt verbalized understanding/agreement She denies acute cardiac sx's

## 2017-06-01 LAB — COMPREHENSIVE METABOLIC PANEL
ALBUMIN: 4.2 g/dL (ref 3.5–4.8)
ALT: 5 IU/L (ref 0–32)
AST: 13 IU/L (ref 0–40)
Albumin/Globulin Ratio: 1.5 (ref 1.2–2.2)
Alkaline Phosphatase: 59 IU/L (ref 39–117)
BUN / CREAT RATIO: 13 (ref 12–28)
BUN: 19 mg/dL (ref 8–27)
CHLORIDE: 105 mmol/L (ref 96–106)
CO2: 22 mmol/L (ref 20–29)
CREATININE: 1.41 mg/dL — AB (ref 0.57–1.00)
Calcium: 9.2 mg/dL (ref 8.7–10.3)
GFR calc Af Amer: 43 mL/min/{1.73_m2} — ABNORMAL LOW (ref >59)
GFR calc non Af Amer: 37 mL/min/{1.73_m2} — ABNORMAL LOW (ref >59)
GLOBULIN, TOTAL: 2.8 g/dL (ref 1.5–4.5)
GLUCOSE: 84 mg/dL (ref 65–99)
Potassium: 4.9 mmol/L (ref 3.5–5.2)
SODIUM: 139 mmol/L (ref 134–144)
Total Protein: 7 g/dL (ref 6.0–8.5)

## 2017-06-06 ENCOUNTER — Ambulatory Visit: Payer: Medicare Other | Admitting: Gastroenterology

## 2017-06-07 ENCOUNTER — Encounter: Payer: Self-pay | Admitting: Gastroenterology

## 2017-06-10 ENCOUNTER — Encounter: Payer: Self-pay | Admitting: Adult Health

## 2017-06-21 DIAGNOSIS — M47817 Spondylosis without myelopathy or radiculopathy, lumbosacral region: Secondary | ICD-10-CM | POA: Diagnosis not present

## 2017-06-21 DIAGNOSIS — G894 Chronic pain syndrome: Secondary | ICD-10-CM | POA: Diagnosis not present

## 2017-06-21 DIAGNOSIS — M62830 Muscle spasm of back: Secondary | ICD-10-CM | POA: Diagnosis not present

## 2017-06-21 DIAGNOSIS — M8000XS Age-related osteoporosis with current pathological fracture, unspecified site, sequela: Secondary | ICD-10-CM | POA: Diagnosis not present

## 2017-06-29 ENCOUNTER — Encounter: Payer: Medicare Other | Admitting: Adult Health

## 2017-07-19 ENCOUNTER — Ambulatory Visit: Payer: Medicare Other | Admitting: Nurse Practitioner

## 2017-07-25 NOTE — Progress Notes (Signed)
[facility]  PATIENT: Tara Ingram DOB: [birth year]   REASON FOR VISIT: Follow-up for Parkinson's disease HISTORY FROM: Patient    HISTORY OF PRESENT ILLNESS:UPDATE 6/25/2019CM Ms.93, 72 year old female returns for follow-up with history of Parkinson's disease.  When last seen her Neupro patch was increased by Dr. Jaynee Ingram however she had a skin reaction and stopped the medication.  She has been off of them for about 5 months.  She currently does not have tremor but has gait difficulty.  She also has a problem with her right foot, hammertoe deformity multiple digital contractures and huge bunion she denies any falls.  She ambulates with a rolling walker.  She is moving to [place] in 2 weeks..  Interval history 01/18/2017: 72 year old with Parkinson's Dz, + DAT scan. Started on Neupro patch. Also trialed on Sinemet.  She feels better. She has felt a heaviness inher chest and stomach, alkaseltzer helped. She feels like she is taking bigger steps. No dizziness, no falls, tremor is a little better. She still feels her balance is poor.   Interval history 11/09/2016:72 year old female who returns today after a positive DAT scan which indicates a parkinsonian disorder.Highvolume lumbar tap was conducted due to possible normal pressure hydrocephalus on MRI but did not significantly improve symptoms. Likely Parkinson's Disease. She feels the medication is helping. She takes the medication at 8, noon and 4pm. She has stopped driving. She has a lot of hip pain and this complicates the treatment.   CHY:IFOY W [name] a 72 y.o.femalehere as a referral from Dr. Wynonia Ingram shuffling gait. Here with her husband who also provides information. Past medical history of sleep apnea, hypertension, hyperlipidemia, kidney stones, headache, GERD, bladder cancer, arthritis, allergy. Patient fell in 2016 and has not gotten over it since she fell, she fell on her deck and  crushed a vertebrae in her back and since then she has been walking differently. Walking has about the same, not better. Her walking is slow. She has chronic back pain. She shuffles, moves the left leg forward and drags the other one. She has pain when she walks and she drags the right foot. She has pain when standing in her back. Before the fall she was walking fine and after the fall her gait changed. She has postural tremors that she has had for years. No resting tremor. Volume of voice not significantly changed but speaks more softly. She denies any dreams or trouble with sleeping.No vivid dreams. No wet pillows or drooling. She has decreased smell for 4-5 years. No urinary incontinence but has difficulty since bladder cancer. She has no difficulty swallowing, handwriting has worsened not small but messy. Denies hallucinations. Mother, grandmother and sister has Parkinson's disease. Feels imbalanced.     REVIEW OF SYSTEMS: Full 14 system review of systems performed and notable only for those listed, all others are neg:  Constitutional: neg  Cardiovascular: neg Ear/Nose/Throat: neg  Skin: neg Eyes: neg Respiratory: neg Gastroitestinal: Urinary urgency Hematology/Lymphatic: neg  Endocrine: neg Musculoskeletal: Walking difficulty Allergy/Immunology: neg Neurological: Occasional headache Psychiatric: neg Sleep : neg   ALLERGIES: No Known Allergies  HOME MEDICATIONS: Outpatient Medications Prior to Visit  Medication Sig Dispense Refill  . denosumab (PROLIA) 60 MG/ML SOLN injection Inject 60 mg into the skin every 6 (six) months. Administer in upper arm, thigh, or abdomen    . diclofenac sodium (VOLTAREN) 1 % GEL Apply 1 application topically 2 (two) times daily as needed (pain).   5  . hydrochlorothiazide (  HYDRODIURIL) 25 MG tablet 1 tab daily Monday, Wednesday, Friday 90 tablet 0  . losartan (COZAAR) 50 MG tablet Take 1 tablet (50 mg total) by mouth daily. 90 tablet 0  . mirabegron ER  (MYRBETRIQ) 50 MG TB24 tablet Take 50 mg by mouth daily.    Marland Kitchen oxyCODONE-acetaminophen (PERCOCET/ROXICET) 5-325 MG tablet Take 1-2 tablets by mouth every 6 (six) hours as needed for severe pain. 15 tablet 0  . potassium chloride (K-DUR) 10 MEQ tablet Take 1 tablet (10 mEq total) by mouth daily. 90 tablet 0  . tiZANidine (ZANAFLEX) 2 MG tablet Take 2 mg by mouth at bedtime as needed for muscle spasms.   2  . rotigotine (NEUPRO) 6 MG/24HR Place 1 patch onto the skin daily. (Patient not taking: Reported on 05/31/2017) 90 patch 5   No facility-administered medications prior to visit.     PAST MEDICAL HISTORY: Past Medical History:  Diagnosis Date  . Allergy   . Arthritis    hands/feet/back ra  . Bladder cancer ([place])   . Cataract    bilateral  . GERD (gastroesophageal reflux disease)   . Headache   . History of kidney stones 50 yrs ago  . Hyperlipidemia   . Hypertension   . Sleep apnea    uses CPAP every night setting of 12    PAST SURGICAL HISTORY: Past Surgical History:  Procedure Laterality Date  . APPENDECTOMY    . BACK SURGERY  05/2014   lower back for crushed disc  . CHOLECYSTECTOMY    . COLONOSCOPY  12/2011   hx polyps/jacobs  . CORNEAL TRANSPLANT     bilateral  . CYSTOSCOPY W/ RETROGRADES Bilateral 09/18/2015   Procedure: BILATERAL RETROGRADE PYELOGRAM;  Surgeon: Tara Hughs, MD;  Location: [facility];  Service: Urology;  Laterality: Bilateral;  . CYSTOSCOPY W/ RETROGRADES Right 10/17/2015   Procedure: CYSTOSCOPY WITH RETROGRADE PYELOGRAM;  Surgeon: Tara Hughs, MD;  Location: [facility];  Service: Urology;  Laterality: Right;  . EYE SURGERY Bilateral    lens replacement for cataract  . FOOT SURGERY Left 6 yrs ago   tammer toe reconstruction  . PARTIAL HYSTERECTOMY    . TONSILLECTOMY    . TRANSURETHRAL RESECTION OF BLADDER TUMOR N/A 09/18/2015   Procedure: TRANSURETHRAL RESECTION OF BLADDER TUMOR (TURBT);  Surgeon: Tara Hughs, MD;  Location: [facility];   Service: Urology;  Laterality: N/A;  . TRANSURETHRAL RESECTION OF BLADDER TUMOR N/A 10/17/2015   Procedure: TRANSURETHRAL RESECTION OF BLADDER TUMOR (TURBT);  Surgeon: Tara Hughs, MD;  Location: [facility];  Service: Urology;  Laterality: N/A;    FAMILY HISTORY: Family History  Problem Relation Age of Onset  . Colon cancer Sister 8  . Allergies Mother   . Heart disease Mother   . Rheumatologic disease Mother   . Heart attack Mother   . Allergies Father   . Asthma Father   . Heart disease Father   . Heart attack Father   . Esophageal cancer Neg Hx   . Colon polyps Neg Hx   . Rectal cancer Neg Hx   . Stomach cancer Neg Hx     SOCIAL HISTORY: Social History   Socioeconomic History  . Marital status: Married    Spouse name: Jeneen Rinks  . Number of children: 0  . Years of education: 15  . Highest education level: Not on file  Occupational History  . Occupation: Retired  Scientific laboratory technician  . Financial resource strain: Not on file  . Food insecurity:  Worry: Not on file    Inability: Not on file  . Transportation needs:    Medical: Not on file    Non-medical: Not on file  Tobacco Use  . Smoking status: Never Smoker  . Smokeless tobacco: Never Used  Substance and Sexual Activity  . Alcohol use: No    Alcohol/week: 0.0 oz  . Drug use: No  . Sexual activity: Yes    Birth control/protection: Surgical    Comment: hysterectomy  Lifestyle  . Physical activity:    Days per week: Not on file    Minutes per session: Not on file  . Stress: Not on file  Relationships  . Social connections:    Talks on phone: Not on file    Gets together: Not on file    Attends religious service: Not on file    Active member of club or organization: Not on file    Attends meetings of clubs or organizations: Not on file    Relationship status: Not on file  . Intimate partner violence:    Fear of current or ex partner: Not on file    Emotionally abused: Not on file    Physically abused: Not  on file    Forced sexual activity: Not on file  Other Topics Concern  . Not on file  Social History Narrative   Lives at home w/ her husband   Right-handed   Caffeine: 3-4 glasses daily     PHYSICAL EXAM  Vitals:   07/26/17 1408  BP: 119/67  Pulse: 76  Weight: 137 lb 3.2 oz (62.2 kg)  Height: 5\' 2"  (1.575 m)   Body mass index is 25.09 kg/m.  Generalized: Well developed, in no acute distress  Head: normocephalic and atraumatic,. Oropharynx benign  Neck: Supple, Musculoskeletal: No deformity   Neurological examination   Mentation: Alert oriented to time, place, history taking. Attention span and concentration appropriate. Recent and remote memory intact.  Follows all commands speech and language fluent.   Cranial nerve II-XII: Pupils were equal round reactive to light extraocular movements were full, visual field were full on confrontational test. Facial sensation and strength were normal. hearing was intact to finger rubbing bilaterally. Uvula tongue midline. head turning and shoulder shrug were normal and symmetric.Tongue protrusion into cheek strength was normal. Motor: normal bulk  full strength in the BUE, BLE, cogwheeling right wrist Sensory: normal and symmetric to light touch,  Coordination: finger-nose-finger, heel-to-shin bilaterally, no dysmetria, mild bradykinesia, no resting tremor Gait and Station: Rising up from seated position without assistance, shuffling gait ambulates with rolling walker DIAGNOSTIC DATA (LABS, IMAGING, TESTING) - I reviewed patient records, labs, notes, testing and imaging myself where available.  Lab Results  Component Value Date   WBC 11.0 (H) 04/16/2017   HGB 11.3 (L) 04/16/2017   HCT 36.0 04/16/2017   MCV 94.5 04/16/2017   PLT 274 04/16/2017      Component Value Date/Time   NA 139 05/31/2017 1420   K 4.9 05/31/2017 1420   CL 105 05/31/2017 1420   CO2 22 05/31/2017 1420   GLUCOSE 84 05/31/2017 1420   GLUCOSE 93 04/16/2017  1316   BUN 19 05/31/2017 1420   CREATININE 1.41 (H) 05/31/2017 1420   CALCIUM 9.2 05/31/2017 1420   PROT 7.0 05/31/2017 1420   ALBUMIN 4.2 05/31/2017 1420   AST 13 05/31/2017 1420   ALT 5 05/31/2017 1420   ALKPHOS 59 05/31/2017 1420   BILITOT <0.2 05/31/2017 1420   GFRNONAA 37 (L)  05/31/2017 1420   GFRAA 43 (L) 05/31/2017 1420   Lab Results  Component Value Date   CHOL 159 04/27/2017   HDL 52 04/27/2017   LDLCALC 87 04/27/2017   TRIG 102 04/27/2017   CHOLHDL 3.1 04/27/2017    Lab Results  Component Value Date   TSH 3.650 04/27/2017      ASSESSMENT AND PLAN 72 year old with parkinsonism. Exam shows shuffling gait, reduced arm swing,  cogwheeling increased tone of the right upper extremity,Loss of smell for years. . Concerning for Parkinson's disease however patient had a significant fall several years ago with fracture of the spine and reports her shuffling gait started immediately afterwards which is not consistent with Parkinson's disease. MRI of the brain showed possible normal pressure hydrocephalus however high-volume tap was unremarkable. DAT scan was positive for parkinsonian syndrome   Pt stopped Neupro due to skin reaction Restart Sinemet 25/100mg  twice daily before a meal Will refer to neuralogist in [facility], pt moving in 2 weeks No follow up planned  Dennie Bible, [facility], [facility], [facility] [address] [place], [place] [address] [phone]

## 2017-07-26 ENCOUNTER — Ambulatory Visit (INDEPENDENT_AMBULATORY_CARE_PROVIDER_SITE_OTHER): Payer: Medicare Other | Admitting: Nurse Practitioner

## 2017-07-26 ENCOUNTER — Encounter: Payer: Self-pay | Admitting: Nurse Practitioner

## 2017-07-26 VITALS — BP 119/67 | HR 76 | Ht 62.0 in | Wt 137.2 lb

## 2017-07-26 DIAGNOSIS — G20 Parkinson's disease: Secondary | ICD-10-CM

## 2017-07-26 MED ORDER — CARBIDOPA-LEVODOPA 25-100 MG PO TABS: 1.0000 | ORAL_TABLET | Freq: Two times a day (BID) | ORAL | 3 refills | Status: AC

## 2017-07-26 NOTE — Patient Instructions (Signed)
Pt stopped Neupro due to skin reaction Restart Sinemet 25/100mg  twice daily before a meal Will refer to neuralogist in [facility] No follow up planned

## 2017-07-27 NOTE — Progress Notes (Signed)
Personally  participated in, made any corrections needed, and agree with history, physical, neuro exam,assessment and plan as stated above.    Jordana Dugue, MD [facility] 

## 2017-08-02 DIAGNOSIS — C674 Malignant neoplasm of posterior wall of bladder: Secondary | ICD-10-CM | POA: Diagnosis not present

## 2017-08-02 DIAGNOSIS — C672 Malignant neoplasm of lateral wall of bladder: Secondary | ICD-10-CM | POA: Diagnosis not present

## 2017-08-02 DIAGNOSIS — R3915 Urgency of urination: Secondary | ICD-10-CM | POA: Diagnosis not present

## 2017-08-10 ENCOUNTER — Other Ambulatory Visit: Payer: Self-pay | Admitting: Adult Health

## 2017-08-10 ENCOUNTER — Other Ambulatory Visit: Payer: Self-pay

## 2017-08-10 MED ORDER — MIRABEGRON ER 50 MG PO TB24: 50.0000 mg | ORAL_TABLET | Freq: Every day | ORAL | 0 refills | Status: AC

## 2017-08-10 NOTE — Telephone Encounter (Signed)
We have not prescribed these medications for the patient previously.  Please review and refill if appropriate.  Pt is moving and only needs a 30 day supply until she establishes with a new MD.   Charyl Bigger, CMA

## 2017-08-26 ENCOUNTER — Ambulatory Visit: Payer: Medicare Other | Admitting: Podiatry

## 2017-09-22 DIAGNOSIS — M549 Dorsalgia, unspecified: Secondary | ICD-10-CM | POA: Diagnosis not present

## 2017-09-22 DIAGNOSIS — G20 Parkinson's disease: Secondary | ICD-10-CM | POA: Diagnosis not present

## 2017-09-22 DIAGNOSIS — M810 Age-related osteoporosis without current pathological fracture: Secondary | ICD-10-CM | POA: Diagnosis not present

## 2017-09-22 DIAGNOSIS — M069 Rheumatoid arthritis, unspecified: Secondary | ICD-10-CM | POA: Diagnosis not present

## 2017-09-22 DIAGNOSIS — I10 Essential (primary) hypertension: Secondary | ICD-10-CM | POA: Diagnosis not present

## 2017-09-22 DIAGNOSIS — Z8551 Personal history of malignant neoplasm of bladder: Secondary | ICD-10-CM | POA: Diagnosis not present

## 2017-09-22 DIAGNOSIS — M159 Polyosteoarthritis, unspecified: Secondary | ICD-10-CM | POA: Diagnosis not present

## 2017-09-22 DIAGNOSIS — J309 Allergic rhinitis, unspecified: Secondary | ICD-10-CM | POA: Diagnosis not present

## 2017-09-22 DIAGNOSIS — Z86010 Personal history of colonic polyps: Secondary | ICD-10-CM | POA: Diagnosis not present

## 2017-09-22 DIAGNOSIS — E785 Hyperlipidemia, unspecified: Secondary | ICD-10-CM | POA: Diagnosis not present

## 2017-09-22 DIAGNOSIS — R269 Unspecified abnormalities of gait and mobility: Secondary | ICD-10-CM | POA: Diagnosis not present

## 2017-11-23 DIAGNOSIS — Z23 Encounter for immunization: Secondary | ICD-10-CM | POA: Diagnosis not present

## 2017-11-24 DIAGNOSIS — Z08 Encounter for follow-up examination after completed treatment for malignant neoplasm: Secondary | ICD-10-CM | POA: Diagnosis not present

## 2017-11-24 DIAGNOSIS — Z8551 Personal history of malignant neoplasm of bladder: Secondary | ICD-10-CM | POA: Diagnosis not present

## 2017-12-15 DIAGNOSIS — C679 Malignant neoplasm of bladder, unspecified: Secondary | ICD-10-CM | POA: Diagnosis not present

## 2017-12-15 DIAGNOSIS — Z8551 Personal history of malignant neoplasm of bladder: Secondary | ICD-10-CM | POA: Diagnosis not present

## 2018-01-01 IMAGING — NM NM DATSCAN
3 series · 18 of 18 positions shown · non-contrast
Comparison: Brain MRI 09/03/2016

CLINICAL DATA: 71-year-old female Poor gait 5 years. Bilateral hand
tremors.

EXAM:
NUCLEAR MEDICINE BRAIN IMAGING WITH SPECT  (DaTscan )
TECHNIQUE: SPECT images of the brain were obtained after intravenous injection
of radiopharmaceutical. 4 hour post injection imaging. Appropriate
positioning. 0.8 ml lugols solution administered in a.m
RADIOPHARMACEUTICALS:  4.9 millicuries I 123 Ioflupane

[Series 1: spect - (id) _(id)_cor · 4.1mm · 4.14mm/px · 6 of 128 frames shown]
[frame 11/128]
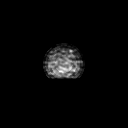
[frame 32/128]
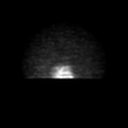
[frame 54/128]
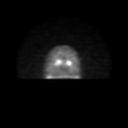
[frame 75/128]
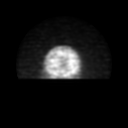
[frame 96/128]
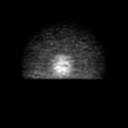
[frame 118/128]
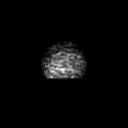

[Series 1: spect - (id) _(id)_tra · 4.1mm · 4.14mm/px · 6 of 128 frames shown]
[frame 11/128]
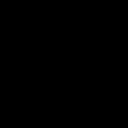
[frame 32/128]
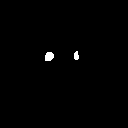
[frame 54/128]
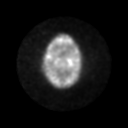
[frame 75/128]
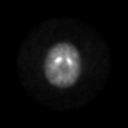
[frame 96/128]
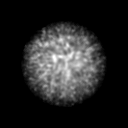
[frame 118/128]
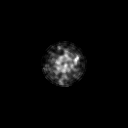

[Series 1: brain spect · 4.14mm/px · 6 of 120 frames shown]
[frame 11/120  full-range]
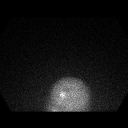
[frame 31/120  full-range]
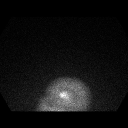
[frame 51/120  full-range]
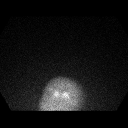
[frame 71/120  full-range]
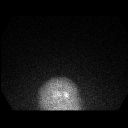
[frame 91/120  full-range]
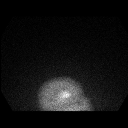
[frame 111/120  full-range]
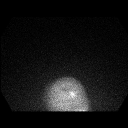

[18 of 18 positions shown; findings below may reference images not displayed]

FINDINGS: The shape of the LEFT and RIGHT striata are normal "comma" shaped.
There is mild decreased relative uptake in the RIGHT putamen
compared to the LEFT. Normal symmetric uptake in the caudate heads.
IMPRESSION: Mild decreased relative radiotracer accumulation within the RIGHT
putamen compared to the LEFT could indicate a mild or early
Parkinson's syndrome pattern.

## 2018-01-05 DIAGNOSIS — I10 Essential (primary) hypertension: Secondary | ICD-10-CM | POA: Diagnosis not present

## 2018-01-05 DIAGNOSIS — M21611 Bunion of right foot: Secondary | ICD-10-CM | POA: Diagnosis not present

## 2018-01-05 DIAGNOSIS — M810 Age-related osteoporosis without current pathological fracture: Secondary | ICD-10-CM | POA: Diagnosis not present

## 2018-01-05 DIAGNOSIS — M069 Rheumatoid arthritis, unspecified: Secondary | ICD-10-CM | POA: Diagnosis not present

## 2018-01-05 DIAGNOSIS — R531 Weakness: Secondary | ICD-10-CM | POA: Diagnosis not present

## 2018-01-05 DIAGNOSIS — G20 Parkinson's disease: Secondary | ICD-10-CM | POA: Diagnosis not present

## 2018-01-05 DIAGNOSIS — M159 Polyosteoarthritis, unspecified: Secondary | ICD-10-CM | POA: Diagnosis not present

## 2018-01-05 DIAGNOSIS — M79671 Pain in right foot: Secondary | ICD-10-CM | POA: Diagnosis not present

## 2018-01-09 DIAGNOSIS — G20 Parkinson's disease: Secondary | ICD-10-CM | POA: Diagnosis not present

## 2018-01-10 DIAGNOSIS — C679 Malignant neoplasm of bladder, unspecified: Secondary | ICD-10-CM | POA: Diagnosis not present

## 2018-01-10 DIAGNOSIS — G20 Parkinson's disease: Secondary | ICD-10-CM | POA: Diagnosis not present

## 2018-01-10 DIAGNOSIS — G8929 Other chronic pain: Secondary | ICD-10-CM | POA: Diagnosis not present

## 2018-01-10 DIAGNOSIS — M79671 Pain in right foot: Secondary | ICD-10-CM | POA: Diagnosis not present

## 2018-01-10 DIAGNOSIS — I10 Essential (primary) hypertension: Secondary | ICD-10-CM | POA: Diagnosis not present

## 2018-01-10 DIAGNOSIS — M069 Rheumatoid arthritis, unspecified: Secondary | ICD-10-CM | POA: Diagnosis not present

## 2018-01-10 DIAGNOSIS — M810 Age-related osteoporosis without current pathological fracture: Secondary | ICD-10-CM | POA: Diagnosis not present

## 2018-01-10 DIAGNOSIS — M150 Primary generalized (osteo)arthritis: Secondary | ICD-10-CM | POA: Diagnosis not present

## 2018-01-10 DIAGNOSIS — M549 Dorsalgia, unspecified: Secondary | ICD-10-CM | POA: Diagnosis not present

## 2018-01-12 DIAGNOSIS — M810 Age-related osteoporosis without current pathological fracture: Secondary | ICD-10-CM | POA: Diagnosis not present

## 2018-01-12 DIAGNOSIS — M549 Dorsalgia, unspecified: Secondary | ICD-10-CM | POA: Diagnosis not present

## 2018-01-12 DIAGNOSIS — I10 Essential (primary) hypertension: Secondary | ICD-10-CM | POA: Diagnosis not present

## 2018-01-12 DIAGNOSIS — M150 Primary generalized (osteo)arthritis: Secondary | ICD-10-CM | POA: Diagnosis not present

## 2018-01-12 DIAGNOSIS — G20 Parkinson's disease: Secondary | ICD-10-CM | POA: Diagnosis not present

## 2018-01-12 DIAGNOSIS — M069 Rheumatoid arthritis, unspecified: Secondary | ICD-10-CM | POA: Diagnosis not present

## 2018-01-17 DIAGNOSIS — G20 Parkinson's disease: Secondary | ICD-10-CM | POA: Diagnosis not present

## 2018-01-17 DIAGNOSIS — M549 Dorsalgia, unspecified: Secondary | ICD-10-CM | POA: Diagnosis not present

## 2018-01-17 DIAGNOSIS — M069 Rheumatoid arthritis, unspecified: Secondary | ICD-10-CM | POA: Diagnosis not present

## 2018-01-17 DIAGNOSIS — M810 Age-related osteoporosis without current pathological fracture: Secondary | ICD-10-CM | POA: Diagnosis not present

## 2018-01-17 DIAGNOSIS — I10 Essential (primary) hypertension: Secondary | ICD-10-CM | POA: Diagnosis not present

## 2018-01-17 DIAGNOSIS — M150 Primary generalized (osteo)arthritis: Secondary | ICD-10-CM | POA: Diagnosis not present

## 2018-01-18 DIAGNOSIS — M549 Dorsalgia, unspecified: Secondary | ICD-10-CM | POA: Diagnosis not present

## 2018-01-18 DIAGNOSIS — M069 Rheumatoid arthritis, unspecified: Secondary | ICD-10-CM | POA: Diagnosis not present

## 2018-01-18 DIAGNOSIS — I10 Essential (primary) hypertension: Secondary | ICD-10-CM | POA: Diagnosis not present

## 2018-01-18 DIAGNOSIS — G20 Parkinson's disease: Secondary | ICD-10-CM | POA: Diagnosis not present

## 2018-01-18 DIAGNOSIS — M150 Primary generalized (osteo)arthritis: Secondary | ICD-10-CM | POA: Diagnosis not present

## 2018-01-18 DIAGNOSIS — M810 Age-related osteoporosis without current pathological fracture: Secondary | ICD-10-CM | POA: Diagnosis not present

## 2018-01-19 DIAGNOSIS — M810 Age-related osteoporosis without current pathological fracture: Secondary | ICD-10-CM | POA: Diagnosis not present

## 2018-01-19 DIAGNOSIS — I10 Essential (primary) hypertension: Secondary | ICD-10-CM | POA: Diagnosis not present

## 2018-01-19 DIAGNOSIS — M069 Rheumatoid arthritis, unspecified: Secondary | ICD-10-CM | POA: Diagnosis not present

## 2018-01-19 DIAGNOSIS — M549 Dorsalgia, unspecified: Secondary | ICD-10-CM | POA: Diagnosis not present

## 2018-01-19 DIAGNOSIS — G20 Parkinson's disease: Secondary | ICD-10-CM | POA: Diagnosis not present

## 2018-01-19 DIAGNOSIS — M150 Primary generalized (osteo)arthritis: Secondary | ICD-10-CM | POA: Diagnosis not present

## 2018-01-28 DIAGNOSIS — M549 Dorsalgia, unspecified: Secondary | ICD-10-CM | POA: Diagnosis not present

## 2018-01-28 DIAGNOSIS — M150 Primary generalized (osteo)arthritis: Secondary | ICD-10-CM | POA: Diagnosis not present

## 2018-01-28 DIAGNOSIS — M810 Age-related osteoporosis without current pathological fracture: Secondary | ICD-10-CM | POA: Diagnosis not present

## 2018-01-28 DIAGNOSIS — G20 Parkinson's disease: Secondary | ICD-10-CM | POA: Diagnosis not present

## 2018-01-28 DIAGNOSIS — M069 Rheumatoid arthritis, unspecified: Secondary | ICD-10-CM | POA: Diagnosis not present

## 2018-01-28 DIAGNOSIS — I10 Essential (primary) hypertension: Secondary | ICD-10-CM | POA: Diagnosis not present

## 2018-01-31 DIAGNOSIS — M150 Primary generalized (osteo)arthritis: Secondary | ICD-10-CM | POA: Diagnosis not present

## 2018-01-31 DIAGNOSIS — I10 Essential (primary) hypertension: Secondary | ICD-10-CM | POA: Diagnosis not present

## 2018-01-31 DIAGNOSIS — G20 Parkinson's disease: Secondary | ICD-10-CM | POA: Diagnosis not present

## 2018-01-31 DIAGNOSIS — M069 Rheumatoid arthritis, unspecified: Secondary | ICD-10-CM | POA: Diagnosis not present

## 2018-01-31 DIAGNOSIS — M810 Age-related osteoporosis without current pathological fracture: Secondary | ICD-10-CM | POA: Diagnosis not present

## 2018-01-31 DIAGNOSIS — M549 Dorsalgia, unspecified: Secondary | ICD-10-CM | POA: Diagnosis not present

## 2018-02-02 DIAGNOSIS — I10 Essential (primary) hypertension: Secondary | ICD-10-CM | POA: Diagnosis not present

## 2018-02-02 DIAGNOSIS — M810 Age-related osteoporosis without current pathological fracture: Secondary | ICD-10-CM | POA: Diagnosis not present

## 2018-02-02 DIAGNOSIS — M150 Primary generalized (osteo)arthritis: Secondary | ICD-10-CM | POA: Diagnosis not present

## 2018-02-02 DIAGNOSIS — G20 Parkinson's disease: Secondary | ICD-10-CM | POA: Diagnosis not present

## 2018-02-02 DIAGNOSIS — Z800 Family history of malignant neoplasm of digestive organs: Secondary | ICD-10-CM | POA: Diagnosis not present

## 2018-02-02 DIAGNOSIS — Z86010 Personal history of colonic polyps: Secondary | ICD-10-CM | POA: Diagnosis not present

## 2018-02-02 DIAGNOSIS — M549 Dorsalgia, unspecified: Secondary | ICD-10-CM | POA: Diagnosis not present

## 2018-02-02 DIAGNOSIS — R1030 Lower abdominal pain, unspecified: Secondary | ICD-10-CM | POA: Diagnosis not present

## 2018-02-02 DIAGNOSIS — M069 Rheumatoid arthritis, unspecified: Secondary | ICD-10-CM | POA: Diagnosis not present

## 2018-02-06 DIAGNOSIS — G20 Parkinson's disease: Secondary | ICD-10-CM | POA: Diagnosis not present

## 2018-02-06 DIAGNOSIS — M549 Dorsalgia, unspecified: Secondary | ICD-10-CM | POA: Diagnosis not present

## 2018-02-06 DIAGNOSIS — M150 Primary generalized (osteo)arthritis: Secondary | ICD-10-CM | POA: Diagnosis not present

## 2018-02-06 DIAGNOSIS — M069 Rheumatoid arthritis, unspecified: Secondary | ICD-10-CM | POA: Diagnosis not present

## 2018-02-06 DIAGNOSIS — M810 Age-related osteoporosis without current pathological fracture: Secondary | ICD-10-CM | POA: Diagnosis not present

## 2018-02-06 DIAGNOSIS — I10 Essential (primary) hypertension: Secondary | ICD-10-CM | POA: Diagnosis not present

## 2018-02-14 DIAGNOSIS — M810 Age-related osteoporosis without current pathological fracture: Secondary | ICD-10-CM | POA: Diagnosis not present

## 2018-02-14 DIAGNOSIS — M069 Rheumatoid arthritis, unspecified: Secondary | ICD-10-CM | POA: Diagnosis not present

## 2018-02-14 DIAGNOSIS — M150 Primary generalized (osteo)arthritis: Secondary | ICD-10-CM | POA: Diagnosis not present

## 2018-02-14 DIAGNOSIS — I10 Essential (primary) hypertension: Secondary | ICD-10-CM | POA: Diagnosis not present

## 2018-02-14 DIAGNOSIS — M549 Dorsalgia, unspecified: Secondary | ICD-10-CM | POA: Diagnosis not present

## 2018-02-14 DIAGNOSIS — G20 Parkinson's disease: Secondary | ICD-10-CM | POA: Diagnosis not present

## 2018-02-16 DIAGNOSIS — M21611 Bunion of right foot: Secondary | ICD-10-CM | POA: Diagnosis not present

## 2018-02-16 DIAGNOSIS — M170 Bilateral primary osteoarthritis of knee: Secondary | ICD-10-CM | POA: Diagnosis not present

## 2018-02-16 DIAGNOSIS — F4323 Adjustment disorder with mixed anxiety and depressed mood: Secondary | ICD-10-CM | POA: Diagnosis not present

## 2018-02-16 DIAGNOSIS — L84 Corns and callosities: Secondary | ICD-10-CM | POA: Diagnosis not present

## 2018-02-16 DIAGNOSIS — R3915 Urgency of urination: Secondary | ICD-10-CM | POA: Diagnosis not present

## 2018-02-16 DIAGNOSIS — I10 Essential (primary) hypertension: Secondary | ICD-10-CM | POA: Diagnosis not present

## 2018-02-16 DIAGNOSIS — M545 Low back pain: Secondary | ICD-10-CM | POA: Diagnosis not present

## 2018-02-16 DIAGNOSIS — M79671 Pain in right foot: Secondary | ICD-10-CM | POA: Diagnosis not present

## 2018-02-21 DIAGNOSIS — M150 Primary generalized (osteo)arthritis: Secondary | ICD-10-CM | POA: Diagnosis not present

## 2018-02-21 DIAGNOSIS — G20 Parkinson's disease: Secondary | ICD-10-CM | POA: Diagnosis not present

## 2018-02-21 DIAGNOSIS — M810 Age-related osteoporosis without current pathological fracture: Secondary | ICD-10-CM | POA: Diagnosis not present

## 2018-02-21 DIAGNOSIS — I10 Essential (primary) hypertension: Secondary | ICD-10-CM | POA: Diagnosis not present

## 2018-02-21 DIAGNOSIS — M069 Rheumatoid arthritis, unspecified: Secondary | ICD-10-CM | POA: Diagnosis not present

## 2018-02-21 DIAGNOSIS — M549 Dorsalgia, unspecified: Secondary | ICD-10-CM | POA: Diagnosis not present

## 2018-02-27 DIAGNOSIS — G20 Parkinson's disease: Secondary | ICD-10-CM | POA: Diagnosis not present

## 2018-03-09 DIAGNOSIS — I10 Essential (primary) hypertension: Secondary | ICD-10-CM | POA: Diagnosis not present

## 2018-03-09 DIAGNOSIS — M549 Dorsalgia, unspecified: Secondary | ICD-10-CM | POA: Diagnosis not present

## 2018-03-09 DIAGNOSIS — M069 Rheumatoid arthritis, unspecified: Secondary | ICD-10-CM | POA: Diagnosis not present

## 2018-03-09 DIAGNOSIS — M810 Age-related osteoporosis without current pathological fracture: Secondary | ICD-10-CM | POA: Diagnosis not present

## 2018-03-09 DIAGNOSIS — M150 Primary generalized (osteo)arthritis: Secondary | ICD-10-CM | POA: Diagnosis not present

## 2018-03-09 DIAGNOSIS — G20 Parkinson's disease: Secondary | ICD-10-CM | POA: Diagnosis not present

## 2018-03-30 DIAGNOSIS — I10 Essential (primary) hypertension: Secondary | ICD-10-CM | POA: Diagnosis not present

## 2018-03-30 DIAGNOSIS — M533 Sacrococcygeal disorders, not elsewhere classified: Secondary | ICD-10-CM | POA: Diagnosis not present

## 2018-03-30 DIAGNOSIS — S34139A Unspecified injury to sacral spinal cord, initial encounter: Secondary | ICD-10-CM | POA: Diagnosis not present

## 2018-03-30 DIAGNOSIS — M7918 Myalgia, other site: Secondary | ICD-10-CM | POA: Diagnosis not present

## 2018-03-30 DIAGNOSIS — N3281 Overactive bladder: Secondary | ICD-10-CM | POA: Diagnosis not present

## 2018-03-30 DIAGNOSIS — G20 Parkinson's disease: Secondary | ICD-10-CM | POA: Diagnosis not present

## 2018-04-12 DIAGNOSIS — M4856XA Collapsed vertebra, not elsewhere classified, lumbar region, initial encounter for fracture: Secondary | ICD-10-CM | POA: Diagnosis not present

## 2018-04-12 DIAGNOSIS — R102 Pelvic and perineal pain: Secondary | ICD-10-CM | POA: Diagnosis not present

## 2018-04-12 DIAGNOSIS — M5137 Other intervertebral disc degeneration, lumbosacral region: Secondary | ICD-10-CM | POA: Diagnosis not present

## 2018-07-04 IMAGING — CR DG SACRUM/COCCYX 2+V
3 series · 3 of 3 positions shown · non-contrast
Comparison: Bone window images from CT abdomen and pelvis
08/15/2015.

CLINICAL DATA: 71-year-old who fell in her bathroom this morning
onto her buttocks and complains of sacrococcygeal pain. Initial
encounter.

EXAM:
SACRUM AND COCCYX - 2+ VIEW

[t sacrum ap]
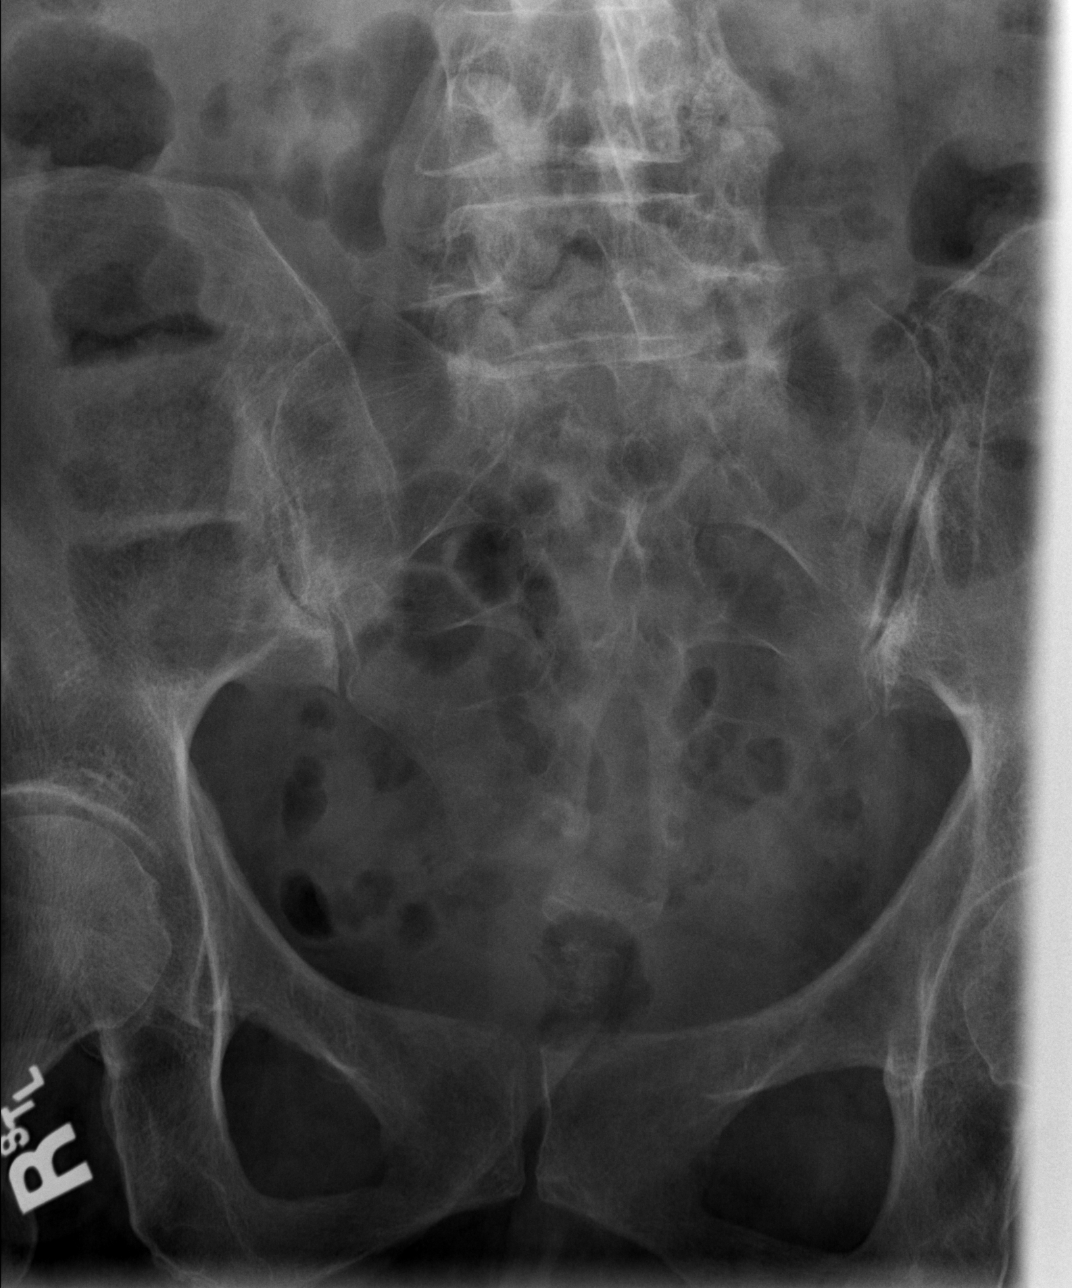

[t coccyx ap]
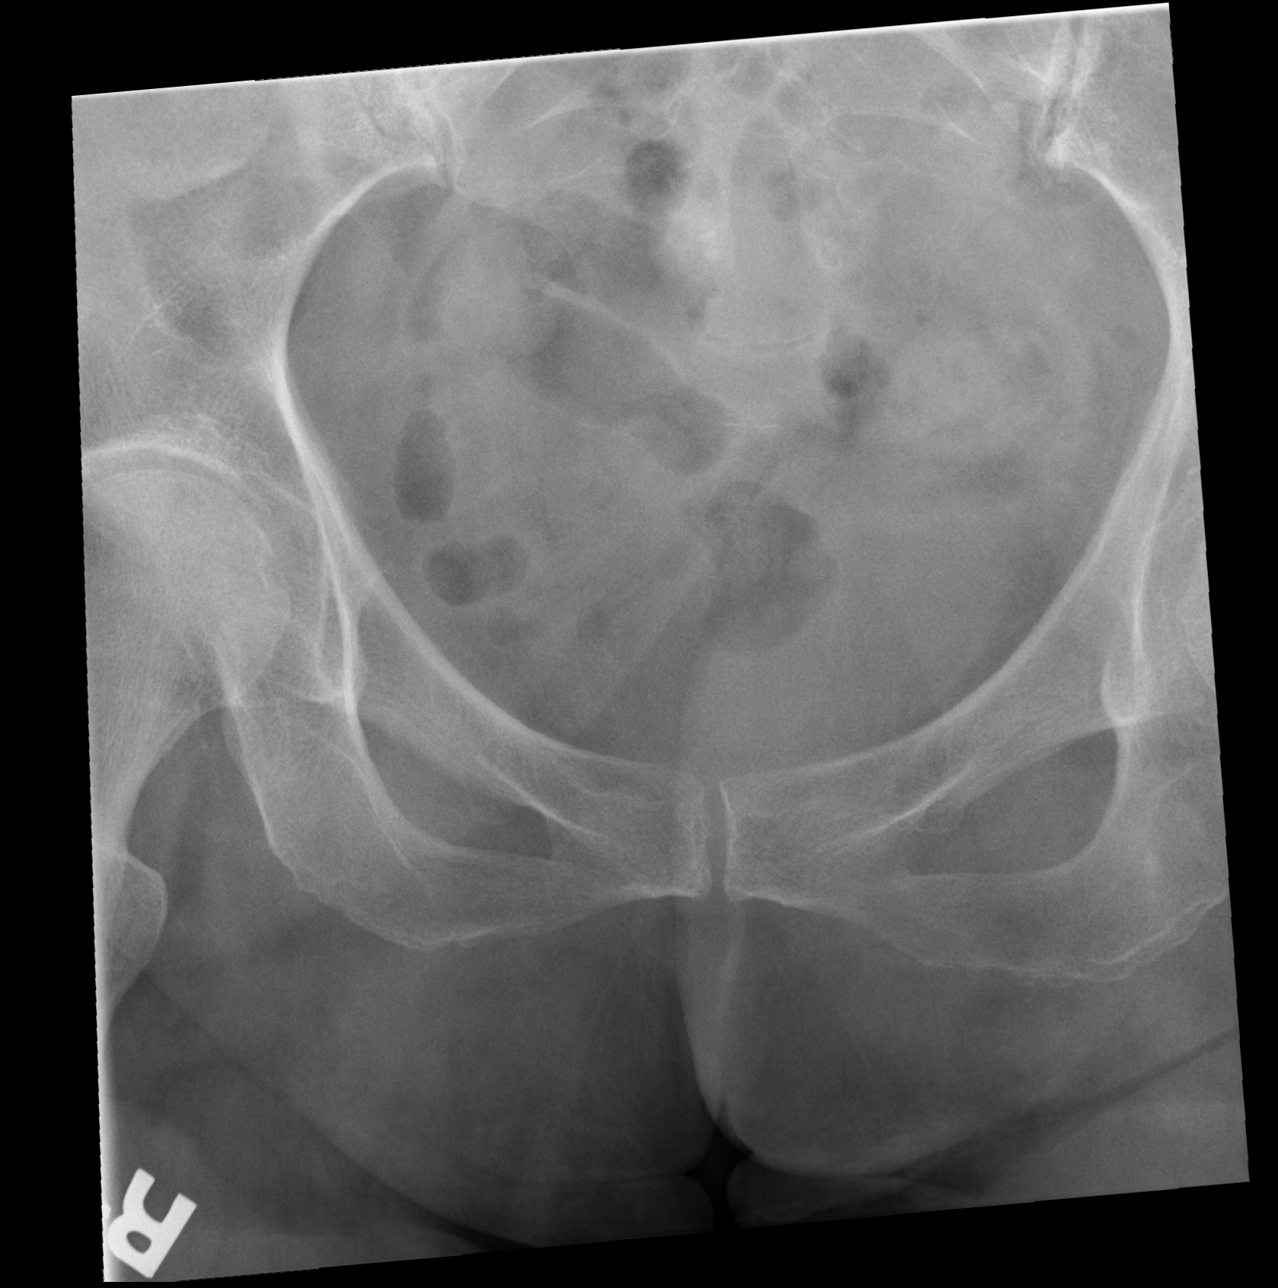

[t sacrum coccyx lat]
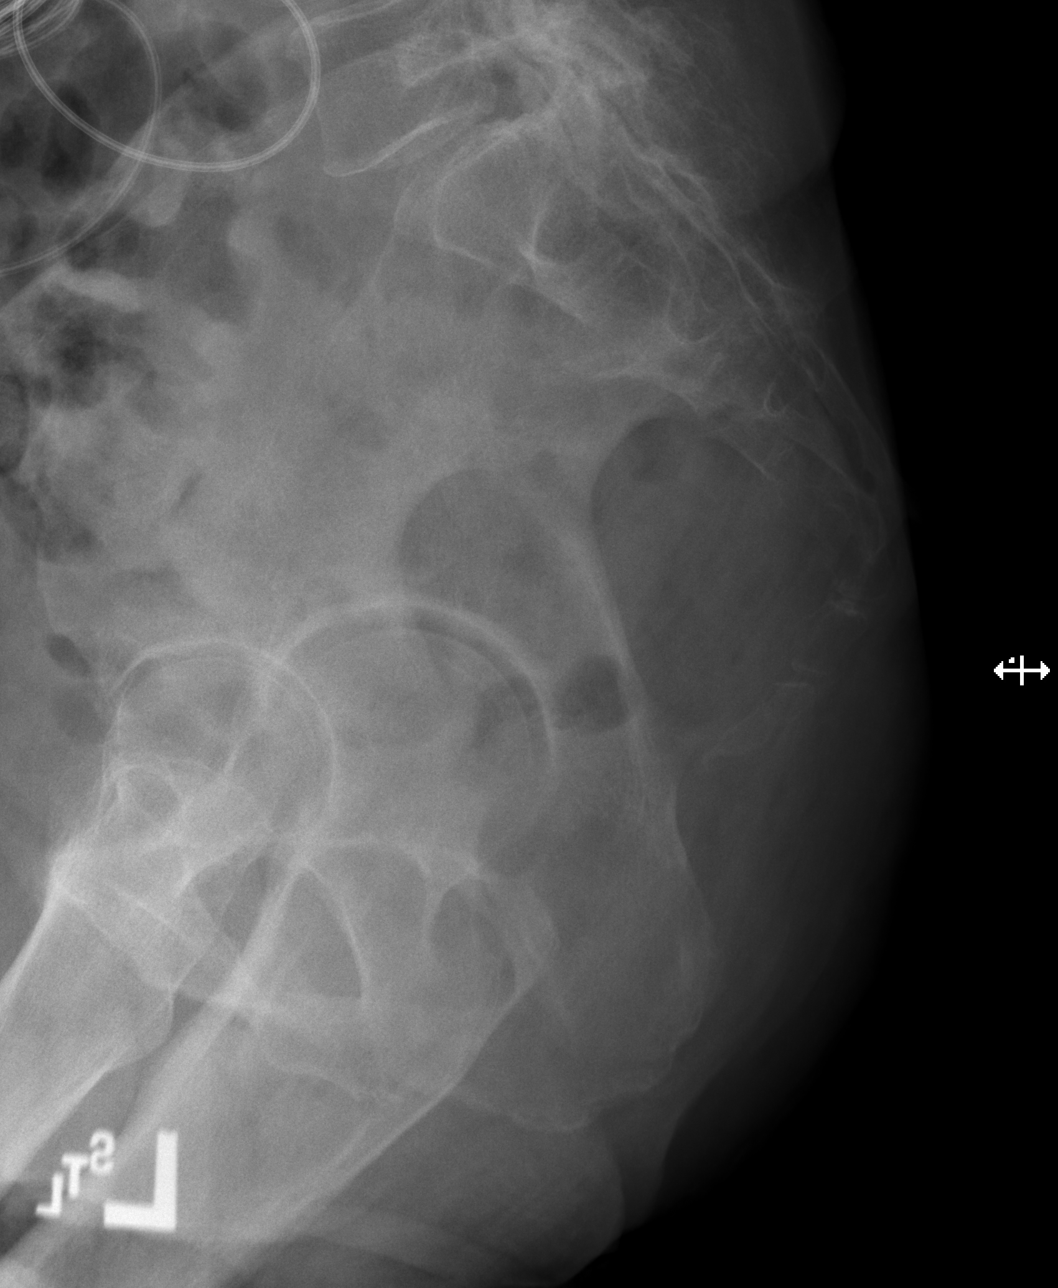

[3 of 3 positions shown; findings below may reference images not displayed]

FINDINGS: The prior CT demonstrated and acutely angled sacrococcygeal junction
as is present on today's examination. However, a fracture involving
the lower sacrum is suspected based on the appearance of the lateral
view. Sacroiliac joints intact with mild degenerative changes.
Symphysis pubis intact.
IMPRESSION: Likely acute fracture involving the lower sacrum. The acute
angulation at the sacrococcygeal junction is unchanged since [DATE].

## 2018-09-06 ENCOUNTER — Encounter: Payer: Self-pay | Admitting: Neurology

## 2018-09-13 ENCOUNTER — Telehealth: Payer: Self-pay | Admitting: Neurology

## 2018-09-13 NOTE — Telephone Encounter (Signed)
Patient called me and she wanted me to mail a copy of her part of the PAP application to her she will fill it out and send back to me.   Bethany , Dr. Jaynee Eagles needs to sign and I need a printed RX . Romelle Starcher will fill in Dr. Jaynee Eagles EXP date for her license . Thanks [name]

## 2018-09-13 NOTE — Telephone Encounter (Signed)
She saw Cecille Rubin last. Yes she can be started on Sinemet IR 25/100mg  twice daily but follow these directions:  -Try to separate Sinemet from food (especially protein-rich foods like meat, dairy, eggs) by about 30-60 mins - this will help the absorption of the medication. If you have some nausea with the medication, you can take it with some light food like crackers or ginger ale.

## 2018-09-13 NOTE — Telephone Encounter (Addendum)
Spoke with Dr. Jaynee Eagles. Will decline this. Pt is now living in [facility] and was referred to another neurologist a year ago. She will need to follow-up with them. Tried to reach pt to let her know. Mobile vm full, home phone number has been disconnected.

## 2018-09-13 NOTE — Telephone Encounter (Signed)
Patient's telephone numbers don't work you will have to send her a letter .  Can you type letter bring back to me I will send . Thanks [name]

## 2018-09-13 NOTE — Telephone Encounter (Signed)
Need to clarify with Dr. Jaynee Eagles. Per NP's last note in 2019:   Pt stopped Neupro due to skin reaction Restart Sinemet 25/100mg  twice daily before a meal Will refer to neuralogist in [facility] No follow up planned

## 2018-09-14 ENCOUNTER — Encounter: Payer: Self-pay | Admitting: *Deleted

## 2018-09-14 NOTE — Telephone Encounter (Signed)
Noted . Thanks Bethany letter sent .

## 2018-09-14 NOTE — Telephone Encounter (Signed)
Letter printed, given to [place].

## 2021-02-01 DEATH — deceased
